# Patient Record
Sex: Female | Born: 1975 | Race: White | Hispanic: No | Marital: Married | Smoking: Former smoker
Health system: Southern US, Community
[De-identification: ages and names within clinical notes are randomized; demographics above are authoritative.]

## PROBLEM LIST (undated history)

## (undated) DIAGNOSIS — O24419 Gestational diabetes mellitus in pregnancy, unspecified control: Secondary | ICD-10-CM

## (undated) DIAGNOSIS — M5030 Other cervical disc degeneration, unspecified cervical region: Secondary | ICD-10-CM

## (undated) HISTORY — DX: Gestational diabetes mellitus in pregnancy, unspecified control: O24.419

## (undated) HISTORY — PX: OTHER SURGICAL HISTORY: SHX169

## (undated) HISTORY — DX: Other cervical disc degeneration, unspecified cervical region: M50.30

---

## 1997-11-11 ENCOUNTER — Inpatient Hospital Stay (HOSPITAL_COMMUNITY): Admission: AD | Admit: 1997-11-11 | Discharge: 1997-11-17 | Payer: Self-pay | Admitting: Obstetrics & Gynecology

## 1997-11-29 ENCOUNTER — Inpatient Hospital Stay (HOSPITAL_COMMUNITY): Admission: AD | Admit: 1997-11-29 | Discharge: 1997-11-29 | Payer: Self-pay | Admitting: *Deleted

## 1997-12-01 ENCOUNTER — Encounter: Admission: RE | Admit: 1997-12-01 | Discharge: 1998-03-01 | Payer: Self-pay | Admitting: Obstetrics

## 1997-12-08 ENCOUNTER — Inpatient Hospital Stay (HOSPITAL_COMMUNITY): Admission: AD | Admit: 1997-12-08 | Discharge: 1997-12-11 | Payer: Self-pay | Admitting: Obstetrics & Gynecology

## 1997-12-26 ENCOUNTER — Inpatient Hospital Stay (HOSPITAL_COMMUNITY): Admission: AD | Admit: 1997-12-26 | Discharge: 1997-12-26 | Payer: Self-pay | Admitting: Obstetrics & Gynecology

## 1998-01-03 ENCOUNTER — Inpatient Hospital Stay (HOSPITAL_COMMUNITY): Admission: AD | Admit: 1998-01-03 | Discharge: 1998-01-03 | Payer: Self-pay | Admitting: Obstetrics

## 1998-01-12 ENCOUNTER — Inpatient Hospital Stay (HOSPITAL_COMMUNITY): Admission: AD | Admit: 1998-01-12 | Discharge: 1998-01-15 | Payer: Self-pay | Admitting: Obstetrics & Gynecology

## 2005-10-07 ENCOUNTER — Encounter: Admission: RE | Admit: 2005-10-07 | Discharge: 2005-10-07 | Payer: Self-pay | Admitting: Gynecology

## 2005-11-06 ENCOUNTER — Ambulatory Visit (HOSPITAL_COMMUNITY): Admission: RE | Admit: 2005-11-06 | Discharge: 2005-11-06 | Payer: Self-pay | Admitting: Obstetrics and Gynecology

## 2005-11-28 ENCOUNTER — Inpatient Hospital Stay (HOSPITAL_COMMUNITY): Admission: AD | Admit: 2005-11-28 | Discharge: 2005-11-30 | Payer: Self-pay | Admitting: Obstetrics and Gynecology

## 2014-08-12 DIAGNOSIS — M5030 Other cervical disc degeneration, unspecified cervical region: Secondary | ICD-10-CM

## 2014-08-12 HISTORY — DX: Other cervical disc degeneration, unspecified cervical region: M50.30

## 2017-07-02 ENCOUNTER — Encounter: Payer: Self-pay | Admitting: Family Medicine

## 2017-07-02 ENCOUNTER — Ambulatory Visit (INDEPENDENT_AMBULATORY_CARE_PROVIDER_SITE_OTHER): Payer: Self-pay | Admitting: Family Medicine

## 2017-07-02 DIAGNOSIS — M545 Low back pain, unspecified: Secondary | ICD-10-CM | POA: Insufficient documentation

## 2017-07-02 DIAGNOSIS — G8929 Other chronic pain: Secondary | ICD-10-CM

## 2017-07-02 MED ORDER — NORTRIPTYLINE HCL 25 MG PO CAPS: ORAL_CAPSULE | ORAL | 0 refills | Status: DC

## 2017-07-02 NOTE — Patient Instructions (Addendum)
  Thank you for coming in today. Start PT.  Try [facility].   Use Nortriptyline for pain at night.   Recheck after MRI.   Use TENS unit.   TENS UNIT: This is helpful for muscle pain and spasm.   Search and Purchase a TENS 7000 2nd edition at  www.tenspros.com or www.Milam.com It should be less than $30.     TENS unit instructions: Do not shower or bathe with the unit on Turn the unit off before removing electrodes or batteries If the electrodes lose stickiness add a drop of water to the electrodes after they are disconnected from the unit and place on plastic sheet. If you continued to have difficulty, call the TENS unit company to purchase more electrodes. Do not apply lotion on the skin area prior to use. Make sure the skin is clean and dry as this will help prolong the life of the electrodes. After use, always check skin for unusual red areas, rash or other skin difficulties. If there are any skin problems, does not apply electrodes to the same area. Never remove the electrodes from the unit by pulling the wires. Do not use the TENS unit or electrodes other than as directed. Do not change electrode placement without consultating your therapist or physician. Keep 2 fingers with between each electrode. Wear time ratio is 2:1, on to off times.    For example on for 30 minutes off for 15 minutes and then on for 30 minutes off for 15 minutes      [facility] Now Open! [facility], their Department of Physical Therapy, and [facility] believe that as a responsible member of the community, contributing to the health and wellness of its citizens is of the greatest importance.    [facility] Hours: M,W,F 8-5 Tu 7-4 Th 9-6  To Schedule an Appointment call:  [phone]  [place], [place] [address]  (Next to [employer] by [employer])        PROVIDING PHYSICAL THERAPY CARE TO THOSE:  WHO DO NOT HAVE  INSURANCE WHOSE INSURANCE COVERAGE HAS LAPSED WHOSE INSURANCE HAS RUN OUT [place] Statement

## 2017-07-02 NOTE — Progress Notes (Signed)
Nancy Cochran is a 41 y.o. female who presents to [place] today for chronic back pain.  [name] is a long history of chronic low back pain.  She has had periodic evaluations in the past including a lumbar x-ray several months ago at an urgent care that was reportedly normal.  She notes pain in her low back associated with stiffness worse when rising from a seated position and worse with activity.  She is tried multiple over-the-counter medications which have not been very effective.  She has had muscle relaxers in the past which did not help.  She is very frustrated with the degree of back pain that she has.  She notes it is quite severe at times and sometimes debilitating.  She is delayed care because she does not have health insurance and is worried about health costs.   Past Medical History:  Diagnosis Date  . DDD (degenerative disc disease), cervical 2016   Dr. Karie Schwalbe   History reviewed. No pertinent surgical history. Social History   Tobacco Use  . Smoking status: Never Smoker  . Smokeless tobacco: Never Used  Substance Use Topics  . Alcohol use: No    Frequency: Never   Family history is unknown by patient.  ROS:  No headache, visual changes, nausea, vomiting, diarrhea, constipation, dizziness, abdominal pain, skin rash, fevers, chills, night sweats, weight loss, swollen lymph nodes, body aches, joint swelling, muscle aches, chest pain, shortness of breath, mood changes, visual or auditory hallucinations.    Medications: Current Outpatient Medications  Medication Sig Dispense Refill  . cyclobenzaprine (FLEXERIL) 5 MG tablet Take 5 mg by mouth 3 (three) times daily as needed for muscle spasms.    . nortriptyline (PAMELOR) 25 MG capsule One capsule by mouth each bedtime for a week then 2 capsules at bedtime. 60 capsule 0   No current facility-administered medications for this visit.    No Known Allergies   Exam:  BP  113/75   Pulse 100   Temp 98.2 F (36.8 C) (Oral)   Resp 16   Wt 132 lb 1.9 oz (59.9 kg)   SpO2 100%  General: Well Developed, well nourished, and in no acute distress.  Neuro/Psych: Alert and oriented x3, extra-ocular muscles intact, able to move all 4 extremities, sensation grossly intact. Skin: Warm and dry, no rashes noted.  Respiratory: Not using accessory muscles, speaking in full sentences, trachea midline.  Cardiovascular: Pulses palpable, no extremity edema. Abdomen: Does not appear distended. MSK: \ L-spine: Nontender to spinal midline.  Tender to palpation bilateral lumbar paraspinal muscles. Significantly decreased lumbar motion especially with flexion and extension.  Limited rotation as well. Patient has poor postural control and motion. Lower extremely strength is equal normal throughout.  Reflexes are intact bilateral knees and ankles and sensation is intact throughout.  Mild antalgic gait present    No results found for this or any previous visit (from the past 48 hour(s)). No results found.    Assessment and Plan: 41 y.o. female with  Chronic low back pain.  X-ray reportedly normal.  Discussed options.  Plan to proceed with MRI of the lumbar spine to evaluate for other serious issues including spondylolisthesis as this would impact treatment options.  We had a lengthy discussion about the poor prognosis of chronic low back pain.  Ultimately I think the best treatment option is probably physical therapy.  Unfortunately her lack of health insurance does impact her ability to receive physical therapy  services.  We will try to seek out pro bono physical therapy at [facility]  Additionally we will try to treat pain with nortriptyline  Recheck following MRI.    Orders Placed This Encounter  Procedures  . MR Lumbar Spine Wo Contrast    Standing Status:   Future    Standing Expiration Date:   09/01/2018    Order Specific Question:   What is the  patient's sedation requirement?    Answer:   No Sedation    Order Specific Question:   Does the patient have a pacemaker or implanted devices?    Answer:   No    Order Specific Question:   Preferred imaging location?    Answer:   Product/process development scientist (table limit-350lbs)    Order Specific Question:   Radiology Contrast Protocol - do NOT remove file path    Answer:   [email or web address]  . Ambulatory referral to Physical Therapy    Referral Priority:   Routine    Referral Type:   Physical Medicine    Referral Reason:   Specialty Services Required    Requested Specialty:   Physical Therapy    Number of Visits Requested:   1   Meds ordered this encounter  Medications  . nortriptyline (PAMELOR) 25 MG capsule    Sig: One capsule by mouth each bedtime for a week then 2 capsules at bedtime.    Dispense:  60 capsule    Refill:  0    Discussed warning signs or symptoms. Please see discharge instructions. Patient expresses understanding.

## 2017-07-08 ENCOUNTER — Telehealth: Payer: Self-pay

## 2017-07-08 NOTE — Telephone Encounter (Signed)
Which medicine are we talking about?

## 2017-07-08 NOTE — Telephone Encounter (Addendum)
Nancy Cochran called and doesn't want to go up to 2 capsules. She is doing better with just taking the 1 capsule. She has reduced the ibuprofen to 2  Tablets daily. Is it necessary to go up to 2? Please advise.

## 2017-07-09 NOTE — Telephone Encounter (Signed)
One of nortriptyline should be fine.

## 2017-07-09 NOTE — Telephone Encounter (Signed)
Sorry, nortriptyline

## 2017-07-10 NOTE — Telephone Encounter (Signed)
Left message advising ok to take just one tablet daily.

## 2017-07-14 ENCOUNTER — Ambulatory Visit (INDEPENDENT_AMBULATORY_CARE_PROVIDER_SITE_OTHER): Payer: Self-pay

## 2017-07-14 DIAGNOSIS — M4317 Spondylolisthesis, lumbosacral region: Secondary | ICD-10-CM

## 2017-07-14 DIAGNOSIS — M545 Low back pain: Principal | ICD-10-CM

## 2017-07-14 DIAGNOSIS — G8929 Other chronic pain: Secondary | ICD-10-CM

## 2017-07-17 ENCOUNTER — Encounter: Payer: Self-pay | Admitting: Family Medicine

## 2017-07-17 ENCOUNTER — Ambulatory Visit (INDEPENDENT_AMBULATORY_CARE_PROVIDER_SITE_OTHER): Payer: Self-pay | Admitting: Family Medicine

## 2017-07-17 VITALS — BP 123/82 | HR 109 | Ht 63.0 in | Wt 131.0 lb

## 2017-07-17 DIAGNOSIS — M545 Low back pain: Secondary | ICD-10-CM

## 2017-07-17 DIAGNOSIS — R202 Paresthesia of skin: Secondary | ICD-10-CM

## 2017-07-17 DIAGNOSIS — M4300 Spondylolysis, site unspecified: Secondary | ICD-10-CM

## 2017-07-17 DIAGNOSIS — G8929 Other chronic pain: Secondary | ICD-10-CM

## 2017-07-17 HISTORY — DX: Spondylolysis, site unspecified: M43.00

## 2017-07-17 MED ORDER — GABAPENTIN 300 MG PO CAPS: 300.0000 mg | ORAL_CAPSULE | Freq: Every day | ORAL | 3 refills | Status: DC

## 2017-07-17 NOTE — Progress Notes (Signed)
Nancy Cochran is a 41 y.o. female who presents to [place] today for chronic back pain.  Nancy Cochran was seen on 07/02/17 for chronic back pain. MRI done in the interim showed some arthritic changes in her lumbar spine bulging disc possibly impinging the left S1 nerve root and most importantly L5-S1 pars defects with anterolisthesis.  On the 21st I ordered a physical therapy referral and she is currently scheduled for [facility] PT at [facility] in January.  She notes continued back pain and that she was unable to tolerate nortriptyline.  Nancy Cochran denies any radiating pain down her left leg in the S1 nerve distribution.  She does note some tingling and itching sensation across the dorsal aspect of her forefoot at toes 2 3 and 4 that interfere with her ability to go to sleep.  This is been ongoing for over a year.  She denies any pain or numbness.   Past Medical History:  Diagnosis Date  . DDD (degenerative disc disease), cervical 2016   Dr. Karie Schwalbe   No past surgical history on file. Social History   Tobacco Use  . Smoking status: Never Smoker  . Smokeless tobacco: Never Used  Substance Use Topics  . Alcohol use: No    Frequency: Never     ROS:  As above   Medications: Current Outpatient Medications  Medication Sig Dispense Refill  . cyclobenzaprine (FLEXERIL) 5 MG tablet Take 5 mg by mouth 3 (three) times daily as needed for muscle spasms.    Marland Kitchen gabapentin (NEURONTIN) 300 MG capsule Take 1 capsule (300 mg total) by mouth at bedtime. 30 capsule 3   No current facility-administered medications for this visit.    No Known Allergies   Exam:  BP 123/82   Pulse (!) 109   Ht 5' 3"  (1.6 m)   Wt 131 lb (59.4 kg)   BMI 23.21 kg/m  General: Well Developed, well nourished, and in no acute distress.  Neuro/Psych: Alert and oriented x3, extra-ocular muscles intact, able to move all 4 extremities, sensation grossly  intact. Skin: Warm and dry, no rashes noted.  Respiratory: Not using accessory muscles, speaking in full sentences, trachea midline.  Cardiovascular: Pulses palpable, no extremity edema. Abdomen: Does not appear distended. MSK:  L-spine: Nontender decreased motion. Left foot normal-appearing nontender normal sensation    No results found for this or any previous visit (from the past 48 hour(s)). Mr Lumbar Spine Wo Contrast  Result Date: 07/14/2017 CLINICAL DATA:  Chronic low back pain.  Gluteal pain. EXAM: MRI LUMBAR SPINE WITHOUT CONTRAST TECHNIQUE: Multiplanar, multisequence MR imaging of the lumbar spine was performed. No intravenous contrast was administered. COMPARISON:  None. FINDINGS: Segmentation:  Standard. Alignment:  Slight grade 1 anterolisthesis at L5-S1. Vertebrae:  No fracture, evidence of discitis, or bone lesion. Conus medullaris and cauda equina: Conus extends to the T12 level. Conus and cauda equina appear normal. Paraspinal and other soft tissues: Negative. Disc levels: T12- L1: Unremarkable. L1-L2: Unremarkable. L2-L3: Unremarkable. L3-L4: Unremarkable. L4-L5: Tiny central disc protrusion without neural contact. L5-S1:Grade 1 anterolisthesis with shallow left paracentral disc protrusion. The disc contacts the left descending S1 nerve root. IMPRESSION: 1. L5-S1 slight anterolisthesis with possible chronic L5 pars defects. 2. L5-S1 shallow left paracentral disc protrusion which contacts the descending S1 nerve root. 3. L4-5 tiny central disc protrusion without impingement. Electronically Signed   By: Monte Fantasia M.D.   On: 07/14/2017 09:00  Assessment and Plan: 41 y.o. female with  Back pain: Very likely due to myofascial disruption and dysfunction. Also Pars Defect is contributing.  Physical therapy will be very helpful.  Paresthesias: Possible related to bulging disc seen on MRI.  I think is more likely this is restless leg syndrome or some peripheral neuropathy.   Trial of gabapentin at nighttime.    No orders of the defined types were placed in this encounter.  Meds ordered this encounter  Medications  . gabapentin (NEURONTIN) 300 MG capsule    Sig: Take 1 capsule (300 mg total) by mouth at bedtime.    Dispense:  30 capsule    Refill:  3    Discussed warning signs or symptoms. Please see discharge instructions. Patient expresses understanding.

## 2017-07-17 NOTE — Patient Instructions (Signed)
Thank you for coming in today. Attend PT at [facility].  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  Work on core exercises.   For foot itching at night take gabapentin at bedtime.

## 2017-07-22 ENCOUNTER — Telehealth: Payer: Self-pay

## 2017-07-22 NOTE — Telephone Encounter (Signed)
Patient called stated that she would like to just take the Flexeril instead of the Gabapentin because it makes her sick to her stomach. Patient is requesting refills on the Flexeril which she got in April  from Urgent Care. Seynabou Fults,CMA

## 2017-07-23 ENCOUNTER — Other Ambulatory Visit: Payer: Self-pay

## 2017-07-23 MED ORDER — CYCLOBENZAPRINE HCL 5 MG PO TABS: 5.0000 mg | ORAL_TABLET | Freq: Three times a day (TID) | ORAL | 3 refills | Status: DC | PRN

## 2017-07-23 NOTE — Telephone Encounter (Signed)
Patient has been informed. Nicosha Struve,CMA  

## 2017-07-23 NOTE — Telephone Encounter (Signed)
Refil of flexeril sent to [facility]. Let me know if she wants it to go to a different pharmacy for cost.

## 2017-07-23 NOTE — Progress Notes (Signed)
Patient stated that gabepentin causes nausea. Rhonda Cunningham,CMA

## 2019-05-07 ENCOUNTER — Other Ambulatory Visit: Payer: Self-pay | Admitting: Family Medicine

## 2019-07-22 LAB — HM MAMMOGRAPHY

## 2019-07-30 LAB — HM PAP SMEAR: HM Pap smear: NEGATIVE

## 2019-08-27 ENCOUNTER — Ambulatory Visit: Payer: Self-pay | Attending: Internal Medicine

## 2019-08-27 ENCOUNTER — Other Ambulatory Visit: Payer: Self-pay

## 2019-08-27 DIAGNOSIS — Z20822 Contact with and (suspected) exposure to covid-19: Secondary | ICD-10-CM | POA: Insufficient documentation

## 2019-08-28 LAB — NOVEL CORONAVIRUS, NAA: SARS-CoV-2, NAA: NOT DETECTED

## 2019-11-19 ENCOUNTER — Encounter: Payer: Self-pay | Admitting: Sports Medicine

## 2019-11-19 ENCOUNTER — Ambulatory Visit (INDEPENDENT_AMBULATORY_CARE_PROVIDER_SITE_OTHER): Payer: Self-pay | Admitting: Sports Medicine

## 2019-11-19 ENCOUNTER — Other Ambulatory Visit: Payer: Self-pay

## 2019-11-19 DIAGNOSIS — M5030 Other cervical disc degeneration, unspecified cervical region: Secondary | ICD-10-CM

## 2019-11-19 DIAGNOSIS — F5101 Primary insomnia: Secondary | ICD-10-CM

## 2019-11-19 DIAGNOSIS — L301 Dyshidrosis [pompholyx]: Secondary | ICD-10-CM

## 2019-11-19 DIAGNOSIS — L309 Dermatitis, unspecified: Secondary | ICD-10-CM | POA: Insufficient documentation

## 2019-11-19 HISTORY — DX: Dermatitis, unspecified: L30.9

## 2019-11-19 HISTORY — DX: Primary insomnia: F51.01

## 2019-11-19 MED ORDER — MELOXICAM 15 MG PO TABS: ORAL_TABLET | ORAL | 3 refills | Status: DC

## 2019-11-19 MED ORDER — PREDNISONE 50 MG PO TABS
ORAL_TABLET | ORAL | 0 refills | Status: DC
Start: 2019-11-19 — End: 2019-12-31

## 2019-11-19 MED ORDER — ZOLPIDEM TARTRATE 10 MG PO TABS: 10.0000 mg | ORAL_TABLET | Freq: Every evening | ORAL | 3 refills | Status: DC | PRN

## 2019-11-19 MED ORDER — CYCLOBENZAPRINE HCL 10 MG PO TABS: ORAL_TABLET | ORAL | 0 refills | Status: DC

## 2019-11-19 MED ORDER — TRIAMCINOLONE ACETONIDE 0.5 % EX OINT: 1.0000 "application " | TOPICAL_OINTMENT | Freq: Two times a day (BID) | CUTANEOUS | 3 refills | Status: DC

## 2019-11-19 NOTE — Assessment & Plan Note (Addendum)
Fred also gets itchy lesions between her toes, they have little blisters and sounds like classic dyshidrotic eczema, adding topical triamcinolone ointment high-dose. She does report occasional color change in her toes, usually during the winter that sounds classic like Raynaud's phenomenon. I would like her to take a picture of this so we can assure her symptoms before starting a calcium channel blocker. I did again explain to her that I would not be continuing her primary care, only her orthopedic care and she needs to establish care with one of my medical partners.

## 2019-11-19 NOTE — Assessment & Plan Note (Signed)
Malai also has cervical DDD, significant pain in the neck, she has seen a chiropractor and had x-rays already that showed DDD. We are going to continue conservative care, home rehab exercises, 5 days of prednisone, Flexeril, meloxicam, return to see me in 4 to 6 weeks, MRI for interventional planning if no better.

## 2019-11-19 NOTE — Progress Notes (Signed)
    Procedures performed today:    None.  Independent interpretation of notes and tests performed by another provider:   None.  Brief History, Exam, Impression, and Recommendations:    Primary insomnia Patient is going to establish care with Dr. Luetta Nutting. In the meantime I would like to help her sleep, she tried Ambien 5 without any success, increasing Ambien 10.  Dyshidrotic eczema Terecia also gets itchy lesions between her toes, they have little blisters and sounds like classic dyshidrotic eczema, adding topical triamcinolone ointment high-dose. She does report occasional color change in her toes, usually during the winter that sounds classic like Raynaud's phenomenon. I would like her to take a picture of this so we can assure her symptoms before starting a calcium channel blocker. I did again explain to her that I would not be continuing her primary care, only her orthopedic care and she needs to establish care with one of my medical partners.  DDD (degenerative disc disease), cervical Almee also has cervical DDD, significant pain in the neck, she has seen a chiropractor and had x-rays already that showed DDD. We are going to continue conservative care, home rehab exercises, 5 days of prednisone, Flexeril, meloxicam, return to see me in 4 to 6 weeks, MRI for interventional planning if no better.    ___________________________________________ Gwen Her. Dianah Field, M.D., ABFM., CAQSM. Primary Care and [place] Instructor of [place] of [facility] of Medicine

## 2019-11-19 NOTE — Assessment & Plan Note (Signed)
Patient is going to establish care with Dr. Luetta Nutting. In the meantime I would like to help her sleep, she tried Ambien 5 without any success, increasing Ambien 10.

## 2019-11-30 ENCOUNTER — Telehealth: Payer: Self-pay

## 2019-11-30 NOTE — Telephone Encounter (Signed)
Does not sound like meloxicam, prednisone can do this for the first several days, if it is not gone today or tomorrow she should really see Dr. Zigmund Daniel in the office THIS WEEK.

## 2019-11-30 NOTE — Telephone Encounter (Signed)
Pt states she has been experiencing racing heart rate, lower back spasms, and a mild headache. She thought it might be attributed to prednisone. Prednisone treatment has been completed. Pt wonders if meloxicam could be causing the symptoms.

## 2019-12-03 NOTE — Telephone Encounter (Signed)
Spoke to Nancy Cochran this morning. Due to not having insurance she would like to avoid another doctors appointment. She states the racing heart rate is an issue she's had since age 44, intermittently. All symptoms have subsided as of this morning. She stopped taking Meloxicam 1 day prior. Advised patient she did not have to take the Meloxicam if she felt it was a contributing factor. Also advised patient if palpitations returned to visit the Urgent Care or ER for treatment.

## 2019-12-31 ENCOUNTER — Ambulatory Visit (INDEPENDENT_AMBULATORY_CARE_PROVIDER_SITE_OTHER): Payer: Self-pay | Admitting: Family Medicine

## 2019-12-31 ENCOUNTER — Other Ambulatory Visit: Payer: Self-pay

## 2019-12-31 ENCOUNTER — Ambulatory Visit (INDEPENDENT_AMBULATORY_CARE_PROVIDER_SITE_OTHER): Payer: Self-pay | Admitting: Sports Medicine

## 2019-12-31 ENCOUNTER — Encounter: Payer: Self-pay | Admitting: Family Medicine

## 2019-12-31 VITALS — BP 120/67 | HR 106 | Temp 98.0°F | Ht 62.99 in | Wt 127.8 lb

## 2019-12-31 DIAGNOSIS — F411 Generalized anxiety disorder: Secondary | ICD-10-CM

## 2019-12-31 DIAGNOSIS — F5101 Primary insomnia: Secondary | ICD-10-CM

## 2019-12-31 DIAGNOSIS — M5030 Other cervical disc degeneration, unspecified cervical region: Secondary | ICD-10-CM

## 2019-12-31 DIAGNOSIS — G43819 Other migraine, intractable, without status migrainosus: Secondary | ICD-10-CM

## 2019-12-31 MED ORDER — AMITRIPTYLINE HCL 25 MG PO TABS: 25.0000 mg | ORAL_TABLET | Freq: Every day | ORAL | 3 refills | Status: DC

## 2019-12-31 MED ORDER — ONDANSETRON 4 MG PO TBDP
4.0000 mg | ORAL_TABLET | Freq: Three times a day (TID) | ORAL | 0 refills | Status: DC | PRN
Start: 2019-12-31 — End: 2021-04-13

## 2019-12-31 NOTE — Patient Instructions (Signed)
Great to meet you today! Let's have you stop flexeril.  Start amitriptyline at bedtime.  I think this will help with sleep, migraines, anxiety and may help with your IBS symptoms as well.   Try to work on increasing fiber intake for hemorrhoids.  IB Donald Prose may help with GI symptoms related to IBS as well.  I will plan to see you back in about 8 weeks.

## 2019-12-31 NOTE — Progress Notes (Signed)
    Procedures performed today:    None.  Independent interpretation of notes and tests performed by another provider:   None.  Brief History, Exam, Impression, and Recommendations:    DDD (degenerative disc disease), cervical Nancy Cochran returns, she has cervical DDD, pain in the neck, left-sided lesion into the trapezius, but not past the shoulder. At this point she has failed greater than 6 weeks of conservative measures including prednisone, meloxicam. Her PCP is switching her from Flexeril to amitriptyline which is appropriate. I am going to proceed with MRI for interventional planning, her pain does seem facetogenic. We can do a Doximity virtual visit to go over MRI results.    ___________________________________________ Gwen Her. Dianah Field, M.D., ABFM., CAQSM. Primary Care and [place] Instructor of [place] of [facility] of Medicine

## 2019-12-31 NOTE — Assessment & Plan Note (Signed)
Nancy Cochran returns, she has cervical DDD, pain in the neck, left-sided lesion into the trapezius, but not past the shoulder. At this point she has failed greater than 6 weeks of conservative measures including prednisone, meloxicam. Her PCP is switching her from Flexeril to amitriptyline which is appropriate. I am going to proceed with MRI for interventional planning, her pain does seem facetogenic. We can do a Doximity virtual visit to go over MRI results.

## 2020-01-02 NOTE — Progress Notes (Signed)
Nancy Cochran - 44 y.o. female MRN [id number]  Date of birth: [birth year]  Subjective Chief Complaint  Patient presents with  . Establish Care    HPI Nancy Cochran is a 44 y.o. with history of neck pain here today for initial visit with me.  She reports that she is having increased headaches that are migrainous in nature.  She has had these for several years.  She has been seeing [facility] for management of neck pain.    She has also had increased anxiety with poor sleep.  She is currently taking trazodone and ambien for this.  She doesn't feel that trazodone has really been helpful.    She reports increased ibs symptoms.  These symptoms alternate between constipation and diarrhea.  She has had hemorrhoid flares due to this.    ROS:  A comprehensive ROS was completed and negative except as noted per HPI  No Known Allergies  Past Medical History:  Diagnosis Date  . DDD (degenerative disc disease), cervical 2016   Dr. Karie Schwalbe  . Gestational diabetes     History reviewed. No pertinent surgical history.  Social History   Socioeconomic History  . Marital status: Single    Spouse name: Not on file  . Number of children: 2  . Years of education: Not on file  . Highest education level: Not on file  Occupational History  . Occupation: Oceanographer  Tobacco Use  . Smoking status: Current Every Day Smoker    Packs/day: 1.00    Years: 28.00    Pack years: 28.00    Types: Cigarettes  . Smokeless tobacco: Never Used  Substance and Sexual Activity  . Alcohol use: No  . Drug use: No  . Sexual activity: Yes    Partners: Male    Birth control/protection: Injection  Other Topics Concern  . Not on file  Social History Narrative  . Not on file   Social Determinants of Health   Financial Resource Strain:   . Difficulty of Paying Living Expenses:   Food Insecurity:   . Worried About Charity fundraiser in the Last Year:   . Arboriculturist in the Last Year:    Transportation Needs:   . Film/video editor (Medical):   Marland Kitchen Lack of Transportation (Non-Medical):   Physical Activity:   . Days of Exercise per Week:   . Minutes of Exercise per Session:   Stress:   . Feeling of Stress :   Social Connections:   . Frequency of Communication with Friends and Family:   . Frequency of Social Gatherings with Friends and Family:   . Attends Religious Services:   . Active Member of Clubs or Organizations:   . Attends Archivist Meetings:   Marland Kitchen Marital Status:     Family History  Problem Relation Age of Onset  . Hypertension Maternal Grandmother   . Heart attack Maternal Grandmother   . Hypertension Maternal Grandfather   . Heart attack Maternal Grandfather   . Hypertension Paternal Grandmother   . Heart attack Paternal Grandmother   . Stroke Paternal Grandmother   . Hypertension Paternal Grandfather   . Heart attack Paternal Grandfather   . Diabetes Maternal Uncle   . Stroke Maternal Aunt   . Breast cancer Neg Hx        Mothers family  . Cancer Neg Hx        Fathers family    Health Maintenance  Topic Date Due  .  HIV Screening  Never done  . TETANUS/TDAP  Never done  . PAP SMEAR-Modifier  Never done  . COVID-19 Vaccine (1) 01/16/2020 (Originally 06[birth year]26/1990)  . INFLUENZA VACCINE  03/12/2020     ----------------------------------------------------------------------------------------------------------------------------------------------------------------------------------------------------------------- Physical Exam BP 120/67 (BP Location: Left Arm, Patient Position: Sitting, Cuff Size: Normal)   Pulse (!) 106   Temp 98 F (36.7 C) (Temporal)   Ht 5' 2.99" (1.6 m)   Wt 127 lb 12.8 oz (58 kg)   SpO2 98%   BMI 22.64 kg/m   Physical Exam Constitutional:      Appearance: Normal appearance.  Eyes:     General: No scleral icterus. Cardiovascular:     Rate and Rhythm: Normal rate and regular rhythm.  Pulmonary:      Effort: Pulmonary effort is normal.     Breath sounds: Normal breath sounds.  Musculoskeletal:     Cervical back: Neck supple.  Skin:    General: Skin is warm and dry.  Neurological:     General: No focal deficit present.     Mental Status: She is alert.  Psychiatric:        Mood and Affect: Mood normal.        Behavior: Behavior normal.     ------------------------------------------------------------------------------------------------------------------------------------------------------------------------------------------------------------------- Assessment and Plan  GAD (generalized anxiety disorder) We discussed options for management of her anxiety.  She also has associated migraine, insomnia and chronic neck pain.   I think amitriptyline would be helpful for several of these conditions.  Discussed cymbalta as an option too but I think the elavil would be more beneficial the insomnia.    Migraines Starting elavil as well as zofran for associated nausea.    Meds ordered this encounter  Medications  . amitriptyline (ELAVIL) 25 MG tablet    Sig: Take 1 tablet (25 mg total) by mouth at bedtime.    Dispense:  30 tablet    Refill:  3  . ondansetron (ZOFRAN ODT) 4 MG disintegrating tablet    Sig: Take 1 tablet (4 mg total) by mouth every 8 (eight) hours as needed for nausea or vomiting.    Dispense:  20 tablet    Refill:  0    Return in about 8 weeks (around 02/25/2020) for Migraines/anxiety.    This visit occurred during the SARS-CoV-2 public health emergency.  Safety protocols were in place, including screening questions prior to the visit, additional usage of staff PPE, and extensive cleaning of exam room while observing appropriate contact time as indicated for disinfecting solutions.

## 2020-01-03 ENCOUNTER — Encounter: Payer: Self-pay | Admitting: Family Medicine

## 2020-01-03 DIAGNOSIS — G43909 Migraine, unspecified, not intractable, without status migrainosus: Secondary | ICD-10-CM | POA: Insufficient documentation

## 2020-01-03 DIAGNOSIS — F411 Generalized anxiety disorder: Secondary | ICD-10-CM | POA: Insufficient documentation

## 2020-01-03 HISTORY — DX: Migraine, unspecified, not intractable, without status migrainosus: G43.909

## 2020-01-03 HISTORY — DX: Generalized anxiety disorder: F41.1

## 2020-01-03 NOTE — Assessment & Plan Note (Signed)
We discussed options for management of her anxiety.  She also has associated migraine, insomnia and chronic neck pain.   I think amitriptyline would be helpful for several of these conditions.  Discussed cymbalta as an option too but I think the elavil would be more beneficial the insomnia.

## 2020-01-03 NOTE — Assessment & Plan Note (Signed)
Starting elavil as well as zofran for associated nausea.

## 2020-01-04 ENCOUNTER — Encounter: Payer: Self-pay | Admitting: Family Medicine

## 2020-01-04 NOTE — Progress Notes (Unsigned)
Negative for intraepithelial lesion or malignancy.  

## 2020-01-06 ENCOUNTER — Encounter: Payer: Self-pay | Admitting: Family Medicine

## 2020-01-07 ENCOUNTER — Other Ambulatory Visit: Payer: Self-pay | Admitting: Family Medicine

## 2020-01-07 MED ORDER — ALBUTEROL SULFATE HFA 108 (90 BASE) MCG/ACT IN AERS: 2.0000 | INHALATION_SPRAY | Freq: Four times a day (QID) | RESPIRATORY_TRACT | 3 refills | Status: DC | PRN

## 2020-01-07 NOTE — Telephone Encounter (Signed)
Do not see inhaler on medication list.

## 2020-01-08 ENCOUNTER — Ambulatory Visit (INDEPENDENT_AMBULATORY_CARE_PROVIDER_SITE_OTHER): Payer: Self-pay

## 2020-01-08 ENCOUNTER — Other Ambulatory Visit: Payer: Self-pay

## 2020-01-08 DIAGNOSIS — M5030 Other cervical disc degeneration, unspecified cervical region: Secondary | ICD-10-CM

## 2020-01-10 ENCOUNTER — Other Ambulatory Visit: Payer: Self-pay

## 2020-01-11 ENCOUNTER — Other Ambulatory Visit: Payer: Self-pay

## 2020-02-23 ENCOUNTER — Encounter: Payer: Self-pay | Admitting: Family Medicine

## 2020-02-23 ENCOUNTER — Telehealth (INDEPENDENT_AMBULATORY_CARE_PROVIDER_SITE_OTHER): Payer: Self-pay | Admitting: Family Medicine

## 2020-02-23 DIAGNOSIS — G43819 Other migraine, intractable, without status migrainosus: Secondary | ICD-10-CM

## 2020-02-23 DIAGNOSIS — F411 Generalized anxiety disorder: Secondary | ICD-10-CM

## 2020-02-23 MED ORDER — HYDROXYZINE HCL 25 MG PO TABS: 25.0000 mg | ORAL_TABLET | Freq: Three times a day (TID) | ORAL | 2 refills | Status: DC | PRN

## 2020-02-23 NOTE — Assessment & Plan Note (Signed)
Continue elavil at current dose Adding hydroxyzine as needed for anxiety.  Return in about 4 weeks (around 03/22/2020) for Anxiety.

## 2020-02-23 NOTE — Assessment & Plan Note (Addendum)
This has improved since starting elavil.  Will continue at current dose as she was unable to tolerate dose increase.   Continues to have back and neck pain.  Discussed potentially changing to cymbalta at next visit if still not adequately controlled but I did let her know this may not be as effective at controlling her migraines.

## 2020-02-23 NOTE — Progress Notes (Signed)
VIRGIL SLINGER - 44 y.o. female MRN [id number]  Date of birth: 1976/01/22   This visit type was conducted due to national recommendations for restrictions regarding the COVID-19 Pandemic (e.g. social distancing).  This format is felt to be most appropriate for this patient at this time.  All issues noted in this document were discussed and addressed.  No physical exam was performed (except for noted visual exam findings with Video Visits).  I discussed the limitations of evaluation and management by telemedicine and the availability of in person appointments. The patient expressed understanding and agreed to proceed.  I connected with@ on 02/23/20 at  8:50 AM EDT by a video enabled telemedicine application and verified that I am speaking with the correct person using two identifiers.  Present at visit: Luetta Nutting, DO Leda Min   Patient Location: [place] [address]   Provider location:   [place]  Patient presents with  . Anxiety    HPI  MEI SUITS is a 44 y.o. female who presents via audio/video conferencing for a telehealth visit today.  She is following up today for anxiety, migraines and back and neck pain.  She was started on amitriptyline at last visit.  Reports significant improvement in migraines since starting this.  Hasn't had a whole lot of improvement in neck and back pain.  Anxiety is about the same.  She did try increasing amitriptyline to to two tablets but reports that she had increased headaches after doing this.     ROS:  A comprehensive ROS was completed and negative except as noted per HPI  Past Medical History:  Diagnosis Date  . DDD (degenerative disc disease), cervical 2016   Dr. Karie Schwalbe  . Gestational diabetes     No past surgical history on file.  Family History  Problem Relation Age of Onset  . Hypertension Maternal Grandmother   . Heart attack Maternal Grandmother   . Hypertension Maternal  Grandfather   . Heart attack Maternal Grandfather   . Hypertension Paternal Grandmother   . Heart attack Paternal Grandmother   . Stroke Paternal Grandmother   . Hypertension Paternal Grandfather   . Heart attack Paternal Grandfather   . Diabetes Maternal Uncle   . Stroke Maternal Aunt   . Breast cancer Neg Hx        Mothers family  . Cancer Neg Hx        Fathers family    Social History   Socioeconomic History  . Marital status: Single    Spouse name: Not on file  . Number of children: 2  . Years of education: Not on file  . Highest education level: Not on file  Occupational History  . Occupation: Oceanographer  Tobacco Use  . Smoking status: Current Every Day Smoker    Packs/day: 1.00    Years: 28.00    Pack years: 28.00    Types: Cigarettes  . Smokeless tobacco: Never Used  Vaping Use  . Vaping Use: Some days  . Substances: Nicotine  Substance and Sexual Activity  . Alcohol use: No  . Drug use: No  . Sexual activity: Yes    Partners: Male    Birth control/protection: Injection  Other Topics Concern  . Not on file  Social History Narrative  . Not on file   Social Determinants of Health   Financial Resource Strain:   . Difficulty of Paying Living Expenses:   Food Insecurity:   .  Worried About Charity fundraiser in the Last Year:   . Arboriculturist in the Last Year:   Transportation Needs:   . Film/video editor (Medical):   Marland Kitchen Lack of Transportation (Non-Medical):   Physical Activity:   . Days of Exercise per Week:   . Minutes of Exercise per Session:   Stress:   . Feeling of Stress :   Social Connections:   . Frequency of Communication with Friends and Family:   . Frequency of Social Gatherings with Friends and Family:   . Attends Religious Services:   . Active Member of Clubs or Organizations:   . Attends Archivist Meetings:   Marland Kitchen Marital Status:   Intimate Partner Violence:   . Fear of Current or Ex-Partner:   . Emotionally  Abused:   Marland Kitchen Physically Abused:   . Sexually Abused:      Current Outpatient Medications:  .  albuterol (VENTOLIN HFA) 108 (90 Base) MCG/ACT inhaler, Inhale 2 puffs into the lungs every 6 (six) hours as needed for wheezing or shortness of breath., Disp: 8 g, Rfl: 3 .  amitriptyline (ELAVIL) 25 MG tablet, Take 1 tablet (25 mg total) by mouth at bedtime., Disp: 30 tablet, Rfl: 3 .  ibuprofen (ADVIL) 400 MG tablet, Take by mouth., Disp: , Rfl:  .  medroxyPROGESTERone (DEPO-PROVERA) 150 MG/ML injection, INJECT 1ML EVERY 11-12 WEEKS, Disp: , Rfl:  .  Melatonin 10 MG TABS, Take by mouth., Disp: , Rfl:  .  meloxicam (MOBIC) 15 MG tablet, One tab PO qAM with a meal for 2 weeks, then daily prn pain., Disp: 30 tablet, Rfl: 3 .  multivitamin-iron-minerals-folic acid (CENTRUM) chewable tablet, Chew 1 tablet by mouth daily., Disp: , Rfl:  .  ondansetron (ZOFRAN ODT) 4 MG disintegrating tablet, Take 1 tablet (4 mg total) by mouth every 8 (eight) hours as needed for nausea or vomiting., Disp: 20 tablet, Rfl: 0 .  zolpidem (AMBIEN) 10 MG tablet, Take 1 tablet (10 mg total) by mouth at bedtime as needed for sleep., Disp: 30 tablet, Rfl: 3  EXAM:  VITALS per patient if applicable: Wt 585 lb (27.7 kg)   BMI 22.68 kg/m   GENERAL: alert, oriented, appears well and in no acute distress  HEENT: atraumatic, conjunttiva clear, no obvious abnormalities on inspection of external nose and ears  NECK: normal movements of the head and neck  LUNGS: on inspection no signs of respiratory distress, breathing rate appears normal, no obvious gross SOB, gasping or wheezing  CV: no obvious cyanosis  MS: moves all visible extremities without noticeable abnormality  PSYCH/NEURO: pleasant and cooperative, no obvious depression or anxiety, speech and thought processing grossly intact  ASSESSMENT AND PLAN:  Discussed the following assessment and plan:  Migraines This has improved since starting elavil.  Will continue  at current dose as she was unable to tolerate dose increase.   Continues to have back and neck pain.  Discussed potentially changing to cymbalta at next visit if still not adequately controlled but I did let her know this may not be as effective at controlling her migraines.    GAD (generalized anxiety disorder) Continue elavil at current dose Adding hydroxyzine as needed for anxiety.  Return in about 4 weeks (around 03/22/2020) for Anxiety.      I discussed the assessment and treatment plan with the patient. The patient was provided an opportunity to ask questions and all were answered. The patient agreed with the plan and demonstrated  an understanding of the instructions.   The patient was advised to call back or seek an in-person evaluation if the symptoms worsen or if the condition fails to improve as anticipated.    Luetta Nutting, DO

## 2020-04-04 NOTE — Telephone Encounter (Signed)
Maybe ask her to call Dr. Isabelle Course office and see how much it would cost?  My suspicion is probably somewhere between $500 and $600.

## 2020-04-16 ENCOUNTER — Other Ambulatory Visit: Payer: Self-pay | Admitting: Family Medicine

## 2020-06-09 ENCOUNTER — Other Ambulatory Visit: Payer: Self-pay | Admitting: Sports Medicine

## 2020-06-09 DIAGNOSIS — F5101 Primary insomnia: Secondary | ICD-10-CM

## 2020-08-16 ENCOUNTER — Other Ambulatory Visit: Payer: Self-pay | Admitting: Family Medicine

## 2020-08-18 ENCOUNTER — Other Ambulatory Visit: Payer: Self-pay

## 2020-09-10 ENCOUNTER — Other Ambulatory Visit: Payer: Self-pay | Admitting: Family Medicine

## 2020-09-13 ENCOUNTER — Encounter: Payer: Self-pay | Admitting: Family Medicine

## 2020-10-12 ENCOUNTER — Other Ambulatory Visit: Payer: Self-pay

## 2020-10-12 DIAGNOSIS — G43819 Other migraine, intractable, without status migrainosus: Secondary | ICD-10-CM

## 2020-10-12 LAB — LIPID PANEL
Cholesterol: 233 mg/dL — ABNORMAL HIGH (ref <200)
HDL: 40 mg/dL — ABNORMAL LOW (ref >=50)
Non-HDL Cholesterol (Calc): 193 mg/dL (calc) — ABNORMAL HIGH (ref <130)

## 2020-10-12 LAB — COMPLETE METABOLIC PANEL WITH GFR
AG Ratio: 1.9 (calc) (ref 1.0–2.5)
ALT: 12 U/L (ref 6–29)
Chloride: 108 mmol/L (ref 98–110)

## 2020-10-12 LAB — CBC: RBC: 5.46 10*6/uL — ABNORMAL HIGH (ref 3.80–5.10)

## 2020-10-13 ENCOUNTER — Encounter: Payer: Self-pay | Admitting: Family Medicine

## 2020-10-13 LAB — CBC
HCT: 50.1 % — ABNORMAL HIGH (ref 35.0–45.0)
Hemoglobin: 16.8 g/dL — ABNORMAL HIGH (ref 11.7–15.5)
MCH: 30.8 pg (ref 27.0–33.0)
MCHC: 33.5 g/dL (ref 32.0–36.0)
MCV: 91.8 fL (ref 80.0–100.0)
MPV: 10.4 fL (ref 7.5–12.5)
Platelets: 355 10*3/uL (ref 140–400)
RDW: 12 % (ref 11.0–15.0)
WBC: 9.7 10*3/uL (ref 3.8–10.8)

## 2020-10-13 LAB — COMPLETE METABOLIC PANEL WITH GFR
AST: 13 U/L (ref 10–30)
Albumin: 4.8 g/dL (ref 3.6–5.1)
Alkaline phosphatase (APISO): 93 U/L (ref 31–125)
BUN: 8 mg/dL (ref 7–25)
CO2: 27 mmol/L (ref 20–32)
Calcium: 9.8 mg/dL (ref 8.6–10.2)
Creat: 0.72 mg/dL (ref 0.50–1.10)
GFR, Est African American: 120 mL/min/{1.73_m2} (ref >=60)
GFR, Est Non African American: 103 mL/min/{1.73_m2} (ref >=60)
Globulin: 2.5 g/dL (calc) (ref 1.9–3.7)
Glucose, Bld: 83 mg/dL (ref 65–139)
Potassium: 4.5 mmol/L (ref 3.5–5.3)
Sodium: 141 mmol/L (ref 135–146)
Total Bilirubin: 0.3 mg/dL (ref 0.2–1.2)
Total Protein: 7.3 g/dL (ref 6.1–8.1)

## 2020-10-13 LAB — LIPID PANEL
LDL Cholesterol (Calc): 159 mg/dL (calc) — ABNORMAL HIGH
Total CHOL/HDL Ratio: 5.8 (calc) — ABNORMAL HIGH (ref <5.0)
Triglycerides: 184 mg/dL — ABNORMAL HIGH (ref <150)

## 2020-10-13 LAB — TSH: TSH: 1.29 mIU/L

## 2020-10-16 ENCOUNTER — Other Ambulatory Visit: Payer: Self-pay | Admitting: Family Medicine

## 2020-10-17 ENCOUNTER — Other Ambulatory Visit: Payer: Self-pay | Admitting: Family Medicine

## 2020-10-17 MED ORDER — AMITRIPTYLINE HCL 25 MG PO TABS: ORAL_TABLET | ORAL | 1 refills | Status: DC

## 2020-12-11 ENCOUNTER — Other Ambulatory Visit: Payer: Self-pay | Admitting: Family Medicine

## 2021-02-07 ENCOUNTER — Other Ambulatory Visit: Payer: Self-pay | Admitting: Family Medicine

## 2021-02-20 ENCOUNTER — Other Ambulatory Visit: Payer: Self-pay | Admitting: Family Medicine

## 2021-02-20 DIAGNOSIS — F5101 Primary insomnia: Secondary | ICD-10-CM

## 2021-03-23 ENCOUNTER — Other Ambulatory Visit: Payer: Self-pay | Admitting: Family Medicine

## 2021-04-04 ENCOUNTER — Telehealth: Payer: Self-pay

## 2021-04-04 NOTE — Telephone Encounter (Signed)
Please call pt to schedule migraine appt. Has not been seen in a year. Unable to write additional medications without an OFFICE visit due to length between visits.   Per Dr. Zigmund Daniel, she can try to take Tylenol, Ibuprofen, increase fluids, rest, ice packs to help move current headache.

## 2021-04-05 NOTE — Telephone Encounter (Signed)
Patient has scheduled a virtual visit for Friday, April 13, 2021. AM

## 2021-04-13 ENCOUNTER — Telehealth (INDEPENDENT_AMBULATORY_CARE_PROVIDER_SITE_OTHER): Payer: Self-pay | Admitting: Family Medicine

## 2021-04-13 ENCOUNTER — Other Ambulatory Visit: Payer: Self-pay | Admitting: Family Medicine

## 2021-04-13 ENCOUNTER — Encounter: Payer: Self-pay | Admitting: Family Medicine

## 2021-04-13 VITALS — Ht 63.0 in | Wt 128.0 lb

## 2021-04-13 DIAGNOSIS — M5030 Other cervical disc degeneration, unspecified cervical region: Secondary | ICD-10-CM

## 2021-04-13 DIAGNOSIS — G43819 Other migraine, intractable, without status migrainosus: Secondary | ICD-10-CM

## 2021-04-13 DIAGNOSIS — F411 Generalized anxiety disorder: Secondary | ICD-10-CM

## 2021-04-13 DIAGNOSIS — F5101 Primary insomnia: Secondary | ICD-10-CM

## 2021-04-13 DIAGNOSIS — M4300 Spondylolysis, site unspecified: Secondary | ICD-10-CM

## 2021-04-13 MED ORDER — ZOLPIDEM TARTRATE 10 MG PO TABS: ORAL_TABLET | ORAL | 1 refills | Status: DC

## 2021-04-13 MED ORDER — ONDANSETRON 4 MG PO TBDP: 4.0000 mg | ORAL_TABLET | Freq: Three times a day (TID) | ORAL | 1 refills | Status: DC | PRN

## 2021-04-13 MED ORDER — AMITRIPTYLINE HCL 25 MG PO TABS: ORAL_TABLET | ORAL | 2 refills | Status: DC

## 2021-04-16 NOTE — Assessment & Plan Note (Signed)
Migraines are fairly well controlled with amitriptyline.  Continue at current strength.

## 2021-04-16 NOTE — Progress Notes (Signed)
Nancy Cochran - 45 y.o. female MRN [id number]  Date of birth: 1976-03-21   This visit type was conducted due to national recommendations for restrictions regarding the COVID-19 Pandemic (e.g. social distancing).  This format is felt to be most appropriate for this patient at this time.  All issues noted in this document were discussed and addressed.  No physical exam was performed (except for noted visual exam findings with Video Visits).  I discussed the limitations of evaluation and management by telemedicine and the availability of in person appointments. The patient expressed understanding and agreed to proceed.  I connected withNAME@ on 04/16/21 at 10:30 AM EDT by a video enabled telemedicine application and verified that I am speaking with the correct person using two identifiers.  Present at visit: Luetta Nutting, DO Leda Min   Patient Location: Home [place] [address]   Provider location:   [place]  No chief complaint on file.   HPI  Nancy Cochran is a 45 y.o. female who presents via audio/video conferencing for a telehealth visit today.  She is following up today for chronic headaches.  She reports that amitriptyline has been helpful for management of her chronic headaches.  She has not had any noticeable side effects related to this.  She does not feel like this has been helpful for her chronic pain related to her degenerative disc disease.  Ibuprofen has helped some however she does not want to take this regularly.  She would be interested in referral to pain management for further options to manage her pain.  Vistaril has been helpful for management of anxiety.  This has not been too sedating for her.  She does continue to use Ambien 1/2-1 tab nightly as needed.  One half tab typically works well for her.  ROS:  A comprehensive ROS was completed and negative except as noted per HPI    ROS:  A comprehensive ROS was completed and negative except as noted  per HPI  Past Medical History:  Diagnosis Date   DDD (degenerative disc disease), cervical 2016   Dr. Karie Schwalbe   Gestational diabetes     History reviewed. No pertinent surgical history.  Family History  Problem Relation Age of Onset   Hypertension Maternal Grandmother    Heart attack Maternal Grandmother    Hypertension Maternal Grandfather    Heart attack Maternal Grandfather    Hypertension Paternal Grandmother    Heart attack Paternal Grandmother    Stroke Paternal Grandmother    Hypertension Paternal Grandfather    Heart attack Paternal Grandfather    Diabetes Maternal Uncle    Stroke Maternal Aunt    Breast cancer Neg Hx        Mothers family   Cancer Neg Hx        Fathers family    Social History   Socioeconomic History   Marital status: Married    Spouse name: Not on file   Number of children: 2   Years of education: Not on file   Highest education level: Not on file  Occupational History   Occupation: Oceanographer  Tobacco Use   Smoking status: Every Day    Packs/day: 1.00    Years: 28.00    Pack years: 28.00    Types: Cigarettes   Smokeless tobacco: Never  Vaping Use   Vaping Use: Some days   Substances: Nicotine  Substance and Sexual Activity   Alcohol use: No   Drug use: No  Sexual activity: Yes    Partners: Male    Birth control/protection: Injection  Other Topics Concern   Not on file  Social History Narrative   Not on file   Social Determinants of Health   Financial Resource Strain: Not on file  Food Insecurity: Not on file  Transportation Needs: Not on file  Physical Activity: Not on file  Stress: Not on file  Social Connections: Not on file  Intimate Partner Violence: Not on file     Current Outpatient Medications:    albuterol (VENTOLIN HFA) 108 (90 Base) MCG/ACT inhaler, Inhale 2 puffs into the lungs every 6 (six) hours as needed for wheezing or shortness of breath., Disp: 8 g, Rfl: 3   hydrOXYzine (ATARAX/VISTARIL)  25 MG tablet, Take 1 tablet (25 mg total) by mouth 3 (three) times daily as needed for anxiety., Disp: 30 tablet, Rfl: 2   ibuprofen (ADVIL) 400 MG tablet, Take by mouth., Disp: , Rfl:    medroxyPROGESTERone (DEPO-PROVERA) 150 MG/ML injection, INJECT 1ML EVERY 11-12 WEEKS, Disp: , Rfl:    multivitamin-iron-minerals-folic acid (CENTRUM) chewable tablet, Chew 1 tablet by mouth daily., Disp: , Rfl:    amitriptyline (ELAVIL) 25 MG tablet, Take 1 tab PO qhs, Disp: 90 tablet, Rfl: 2   ondansetron (ZOFRAN ODT) 4 MG disintegrating tablet, Take 1 tablet (4 mg total) by mouth every 8 (eight) hours as needed for nausea or vomiting., Disp: 10 tablet, Rfl: 1   zolpidem (AMBIEN) 10 MG tablet, TAKE 1/2-1 TABLET BY MOUTH EVERY DAY AT BEDTIME AS NEEDED FOR SLEEP, Disp: 90 tablet, Rfl: 1  EXAM:  VITALS per patient if applicable: Ht 5' 3"  (1.6 m)   Wt 128 lb (58.1 kg)   BMI 22.67 kg/m   GENERAL: alert, oriented, appears well and in no acute distress  HEENT: atraumatic, conjunttiva clear, no obvious abnormalities on inspection of external nose and ears  NECK: normal movements of the head and neck  LUNGS: on inspection no signs of respiratory distress, breathing rate appears normal, no obvious gross SOB, gasping or wheezing  CV: no obvious cyanosis  MS: moves all visible extremities without noticeable abnormality  PSYCH/NEURO: pleasant and cooperative, no obvious depression or anxiety, speech and thought processing grossly intact  ASSESSMENT AND PLAN:  Discussed the following assessment and plan:  DDD (degenerative disc disease), cervical Epidural steroid injections discussed with her previously at her visit with Dr. Dianah Field.  She does not want to pursue these at this time due to cost.  She was referred to see pain management to review other potential remedies for long-term management of her pain.  Migraines Migraines are fairly well controlled with amitriptyline.  Continue at current  strength.  GAD (generalized anxiety disorder) Stable with Vistaril as needed.  Amitriptyline has had some antianxiety effect for her as well.  Primary insomnia Continue Ambien one half tab to 1 tab as needed each night.     I discussed the assessment and treatment plan with the patient. The patient was provided an opportunity to ask questions and all were answered. The patient agreed with the plan and demonstrated an understanding of the instructions.   The patient was advised to call back or seek an in-person evaluation if the symptoms worsen or if the condition fails to improve as anticipated.    Luetta Nutting, DO

## 2021-04-16 NOTE — Assessment & Plan Note (Signed)
Epidural steroid injections discussed with her previously at her visit with Dr. Dianah Field.  She does not want to pursue these at this time due to cost.  She was referred to see pain management to review other potential remedies for long-term management of her pain.

## 2021-04-16 NOTE — Assessment & Plan Note (Signed)
Continue Ambien one half tab to 1 tab as needed each night.

## 2021-04-16 NOTE — Assessment & Plan Note (Signed)
Stable with Vistaril as needed.  Amitriptyline has had some antianxiety effect for her as well.

## 2021-06-14 ENCOUNTER — Encounter: Payer: Self-pay | Admitting: Family Medicine

## 2021-07-16 ENCOUNTER — Encounter: Payer: Self-pay | Admitting: Family Medicine

## 2021-10-18 ENCOUNTER — Inpatient Hospital Stay (HOSPITAL_COMMUNITY)
Admission: EM | Admit: 2021-10-18 | Discharge: 2021-10-20 | DRG: 064 | Disposition: A | Discharge status: Home / Self Care | Payer: Self-pay | Attending: Family Medicine | Admitting: Family Medicine

## 2021-10-18 ENCOUNTER — Emergency Department (HOSPITAL_COMMUNITY): Payer: Self-pay

## 2021-10-18 ENCOUNTER — Other Ambulatory Visit: Payer: Self-pay

## 2021-10-18 ENCOUNTER — Observation Stay (HOSPITAL_COMMUNITY): Payer: Self-pay

## 2021-10-18 ENCOUNTER — Encounter (HOSPITAL_COMMUNITY): Payer: Self-pay | Admitting: Emergency Medicine

## 2021-10-18 DIAGNOSIS — R42 Dizziness and giddiness: Secondary | ICD-10-CM

## 2021-10-18 DIAGNOSIS — A4189 Other specified sepsis: Secondary | ICD-10-CM | POA: Diagnosis present

## 2021-10-18 DIAGNOSIS — R2681 Unsteadiness on feet: Secondary | ICD-10-CM | POA: Diagnosis present

## 2021-10-18 DIAGNOSIS — I6789 Other cerebrovascular disease: Secondary | ICD-10-CM

## 2021-10-18 DIAGNOSIS — K529 Noninfective gastroenteritis and colitis, unspecified: Secondary | ICD-10-CM

## 2021-10-18 DIAGNOSIS — G4700 Insomnia, unspecified: Secondary | ICD-10-CM | POA: Diagnosis present

## 2021-10-18 DIAGNOSIS — Z20822 Contact with and (suspected) exposure to covid-19: Secondary | ICD-10-CM | POA: Diagnosis present

## 2021-10-18 DIAGNOSIS — I63541 Cerebral infarction due to unspecified occlusion or stenosis of right cerebellar artery: Principal | ICD-10-CM | POA: Diagnosis present

## 2021-10-18 DIAGNOSIS — Z8249 Family history of ischemic heart disease and other diseases of the circulatory system: Secondary | ICD-10-CM

## 2021-10-18 DIAGNOSIS — Z79899 Other long term (current) drug therapy: Secondary | ICD-10-CM

## 2021-10-18 DIAGNOSIS — Z823 Family history of stroke: Secondary | ICD-10-CM

## 2021-10-18 DIAGNOSIS — E876 Hypokalemia: Secondary | ICD-10-CM | POA: Diagnosis not present

## 2021-10-18 DIAGNOSIS — Z793 Long term (current) use of hormonal contraceptives: Secondary | ICD-10-CM

## 2021-10-18 DIAGNOSIS — E785 Hyperlipidemia, unspecified: Secondary | ICD-10-CM | POA: Diagnosis present

## 2021-10-18 DIAGNOSIS — F17210 Nicotine dependence, cigarettes, uncomplicated: Secondary | ICD-10-CM | POA: Diagnosis present

## 2021-10-18 DIAGNOSIS — I639 Cerebral infarction, unspecified: Secondary | ICD-10-CM | POA: Diagnosis present

## 2021-10-18 DIAGNOSIS — R29702 NIHSS score 2: Secondary | ICD-10-CM | POA: Diagnosis present

## 2021-10-18 DIAGNOSIS — Z833 Family history of diabetes mellitus: Secondary | ICD-10-CM

## 2021-10-18 DIAGNOSIS — F419 Anxiety disorder, unspecified: Secondary | ICD-10-CM | POA: Diagnosis present

## 2021-10-18 DIAGNOSIS — K5100 Ulcerative (chronic) pancolitis without complications: Secondary | ICD-10-CM | POA: Diagnosis present

## 2021-10-18 HISTORY — DX: Dizziness and giddiness: R42

## 2021-10-18 HISTORY — DX: Noninfective gastroenteritis and colitis, unspecified: K52.9

## 2021-10-18 HISTORY — DX: Other cerebrovascular disease: I67.89

## 2021-10-18 LAB — RESP PANEL BY RT-PCR (FLU A&B, COVID) ARPGX2
Influenza A by PCR: NEGATIVE
Influenza B by PCR: NEGATIVE
SARS Coronavirus 2 by RT PCR: NEGATIVE

## 2021-10-18 LAB — URINALYSIS, ROUTINE W REFLEX MICROSCOPIC
Bilirubin Urine: NEGATIVE
Glucose, UA: NEGATIVE mg/dL
Hgb urine dipstick: NEGATIVE
Ketones, ur: NEGATIVE mg/dL
Leukocytes,Ua: NEGATIVE
Nitrite: NEGATIVE
Protein, ur: NEGATIVE mg/dL
Specific Gravity, Urine: 1.012 (ref 1.005–1.030)
pH: 5 (ref 5.0–8.0)

## 2021-10-18 LAB — CBG MONITORING, ED: Glucose-Capillary: 184 mg/dL — ABNORMAL HIGH (ref 70–99)

## 2021-10-18 LAB — COMPREHENSIVE METABOLIC PANEL WITH GFR
ALT: 12 U/L (ref 0–44)
AST: 13 U/L — ABNORMAL LOW (ref 15–41)
Albumin: 3.6 g/dL (ref 3.5–5.0)
Alkaline Phosphatase: 73 U/L (ref 38–126)
Anion gap: 10 (ref 5–15)
BUN: 12 mg/dL (ref 6–20)
CO2: 21 mmol/L — ABNORMAL LOW (ref 22–32)
Calcium: 8.3 mg/dL — ABNORMAL LOW (ref 8.9–10.3)
Chloride: 108 mmol/L (ref 98–111)
Creatinine, Ser: 0.68 mg/dL (ref 0.44–1.00)
GFR, Estimated: >60 mL/min
Glucose, Bld: 165 mg/dL — ABNORMAL HIGH (ref 70–99)
Potassium: 3.3 mmol/L — ABNORMAL LOW (ref 3.5–5.1)
Sodium: 139 mmol/L (ref 135–145)
Total Bilirubin: 0.4 mg/dL (ref 0.3–1.2)
Total Protein: 6.3 g/dL — ABNORMAL LOW (ref 6.5–8.1)

## 2021-10-18 LAB — CBC
HCT: 42.1 % (ref 36.0–46.0)
Hemoglobin: 13.4 g/dL (ref 12.0–15.0)
MCH: 29.9 pg (ref 26.0–34.0)
MCHC: 31.8 g/dL (ref 30.0–36.0)
MCV: 94 fL (ref 80.0–100.0)
Platelets: 368 10*3/uL (ref 150–400)
RBC: 4.48 MIL/uL (ref 3.87–5.11)
RDW: 12.2 % (ref 11.5–15.5)
WBC: 16.1 10*3/uL — ABNORMAL HIGH (ref 4.0–10.5)
nRBC: 0 % (ref 0.0–0.2)

## 2021-10-18 LAB — PREGNANCY, URINE: Preg Test, Ur: NEGATIVE

## 2021-10-18 LAB — TSH: TSH: 0.845 u[IU]/mL (ref 0.350–4.500)

## 2021-10-18 MED ORDER — METRONIDAZOLE 500 MG/100ML IV SOLN
500.0000 mg | Freq: Two times a day (BID) | INTRAVENOUS | Status: DC
  Administered 2021-10-18 – 2021-10-20 (×4): 500 mg via INTRAVENOUS
  Filled 2021-10-18 (×4): qty 100

## 2021-10-18 MED ORDER — MECLIZINE HCL 12.5 MG PO TABS
25.0000 mg | ORAL_TABLET | Freq: Three times a day (TID) | ORAL | Status: DC | PRN
  Filled 2021-10-18: qty 2

## 2021-10-18 MED ORDER — HYDROXYZINE HCL 25 MG PO TABS: 25.0000 mg | ORAL_TABLET | Freq: Three times a day (TID) | ORAL | Status: DC | PRN

## 2021-10-18 MED ORDER — HEPARIN SODIUM (PORCINE) 5000 UNIT/ML IJ SOLN
5000.0000 [IU] | Freq: Three times a day (TID) | INTRAMUSCULAR | Status: DC
  Administered 2021-10-18 – 2021-10-20 (×5): 5000 [IU] via SUBCUTANEOUS
  Filled 2021-10-18 (×5): qty 1

## 2021-10-18 MED ORDER — CHLORPROMAZINE HCL 25 MG/ML IJ SOLN
50.0000 mg | Freq: Once | INTRAMUSCULAR | Status: AC
  Administered 2021-10-18: 17:00:00 50 mg via INTRAMUSCULAR
  Filled 2021-10-18: qty 2

## 2021-10-18 MED ORDER — LORAZEPAM 2 MG/ML IJ SOLN
1.0000 mg | Freq: Four times a day (QID) | INTRAMUSCULAR | Status: DC | PRN
Start: 2021-10-18 — End: 2021-10-19
  Administered 2021-10-18 – 2021-10-19 (×2): 1 mg via INTRAVENOUS
  Filled 2021-10-18 (×2): qty 1

## 2021-10-18 MED ORDER — SODIUM CHLORIDE 0.9 % IV SOLN: 250.0000 mL | INTRAVENOUS | Status: DC | PRN

## 2021-10-18 MED ORDER — KETOROLAC TROMETHAMINE 30 MG/ML IJ SOLN
30.0000 mg | Freq: Once | INTRAMUSCULAR | Status: AC
  Administered 2021-10-18: 17:00:00 30 mg via INTRAVENOUS
  Filled 2021-10-18: qty 1

## 2021-10-18 MED ORDER — SODIUM CHLORIDE 0.9% FLUSH
3.0000 mL | Freq: Two times a day (BID) | INTRAVENOUS | Status: DC
  Administered 2021-10-18 – 2021-10-20 (×2): 3 mL via INTRAVENOUS

## 2021-10-18 MED ORDER — IOHEXOL 300 MG/ML  SOLN
100.0000 mL | Freq: Once | INTRAMUSCULAR | Status: AC | PRN
  Administered 2021-10-18: 11:00:00 100 mL via INTRAVENOUS

## 2021-10-18 MED ORDER — ATORVASTATIN CALCIUM 40 MG PO TABS
40.0000 mg | ORAL_TABLET | Freq: Every day | ORAL | Status: DC
  Administered 2021-10-20: 40 mg via ORAL
  Filled 2021-10-18 (×3): qty 1

## 2021-10-18 MED ORDER — ONDANSETRON HCL 4 MG/2ML IJ SOLN
4.0000 mg | Freq: Four times a day (QID) | INTRAMUSCULAR | Status: DC | PRN
  Administered 2021-10-19: 4 mg via INTRAVENOUS
  Filled 2021-10-18: qty 2

## 2021-10-18 MED ORDER — ONDANSETRON HCL 4 MG/2ML IJ SOLN
4.0000 mg | Freq: Once | INTRAMUSCULAR | Status: AC
  Administered 2021-10-18: 10:00:00 4 mg via INTRAVENOUS
  Filled 2021-10-18: qty 2

## 2021-10-18 MED ORDER — POTASSIUM CHLORIDE IN NACL 20-0.9 MEQ/L-% IV SOLN
INTRAVENOUS | Status: DC
Start: 2021-10-18 — End: 2021-10-20

## 2021-10-18 MED ORDER — PROCHLORPERAZINE EDISYLATE 10 MG/2ML IJ SOLN
10.0000 mg | Freq: Once | INTRAMUSCULAR | Status: AC
  Administered 2021-10-18: 08:00:00 10 mg via INTRAVENOUS
  Filled 2021-10-18: qty 2

## 2021-10-18 MED ORDER — SODIUM CHLORIDE 0.9 % IV BOLUS
1000.0000 mL | Freq: Once | INTRAVENOUS | Status: AC
  Administered 2021-10-18: 19:00:00 1000 mL via INTRAVENOUS

## 2021-10-18 MED ORDER — ACETAMINOPHEN 650 MG RE SUPP: 650.0000 mg | Freq: Four times a day (QID) | RECTAL | Status: DC | PRN

## 2021-10-18 MED ORDER — BISACODYL 10 MG RE SUPP: 10.0000 mg | Freq: Every day | RECTAL | Status: DC | PRN

## 2021-10-18 MED ORDER — CEFTRIAXONE SODIUM 2 G IJ SOLR
2.0000 g | INTRAMUSCULAR | Status: DC
Start: 2021-10-18 — End: 2021-10-20
  Administered 2021-10-18 – 2021-10-19 (×2): 2 g via INTRAVENOUS
  Filled 2021-10-18 (×2): qty 20

## 2021-10-18 MED ORDER — ASPIRIN 300 MG RE SUPP
300.0000 mg | Freq: Every day | RECTAL | Status: DC
  Administered 2021-10-18 – 2021-10-19 (×2): 300 mg via RECTAL
  Filled 2021-10-18 (×3): qty 1

## 2021-10-18 MED ORDER — AMITRIPTYLINE HCL 25 MG PO TABS
25.0000 mg | ORAL_TABLET | Freq: Every day | ORAL | Status: DC
  Administered 2021-10-19: 25 mg via ORAL
  Filled 2021-10-18 (×2): qty 1

## 2021-10-18 MED ORDER — SODIUM CHLORIDE 0.9% FLUSH: 3.0000 mL | INTRAVENOUS | Status: DC | PRN

## 2021-10-18 MED ORDER — POLYETHYLENE GLYCOL 3350 17 G PO PACK: 17.0000 g | PACK | Freq: Every day | ORAL | Status: DC | PRN

## 2021-10-18 MED ORDER — ONDANSETRON HCL 4 MG PO TABS: 4.0000 mg | ORAL_TABLET | Freq: Four times a day (QID) | ORAL | Status: DC | PRN

## 2021-10-18 MED ORDER — KETOROLAC TROMETHAMINE 15 MG/ML IJ SOLN: 15.0000 mg | Freq: Four times a day (QID) | INTRAMUSCULAR | Status: DC | PRN

## 2021-10-18 MED ORDER — SCOPOLAMINE 1 MG/3DAYS TD PT72
1.0000 | MEDICATED_PATCH | TRANSDERMAL | Status: DC
  Administered 2021-10-18: 17:00:00 1.5 mg via TRANSDERMAL
  Filled 2021-10-18: qty 1

## 2021-10-18 MED ORDER — TRAZODONE HCL 50 MG PO TABS: 50.0000 mg | ORAL_TABLET | Freq: Every evening | ORAL | Status: DC | PRN

## 2021-10-18 MED ORDER — ACETAMINOPHEN 325 MG PO TABS
650.0000 mg | ORAL_TABLET | Freq: Four times a day (QID) | ORAL | Status: DC | PRN
  Filled 2021-10-18: qty 2

## 2021-10-18 NOTE — ED Notes (Signed)
Pt refused to have oral temp taken had to do axillary ?

## 2021-10-18 NOTE — H&P (Signed)
Patient Demographics:    Nancy Cochran, is a 46 y.o. female  MRN: [id number]   DOB - 1975-09-11  Admit Date - 10/18/2021  Outpatient Primary MD for the patient is Luetta Nutting, DO   Assessment & Plan:   Assessment and Plan:    1) severe persistent vertigo with nausea and vomiting--- rule out CVA in the posterior circulation -MRI brain pending -May use scopolamine patch--- as patient is unable to take oral intake -As needed lorazepam and Zofran -Patient with intractable emesis due to severe persistent vertigo -Continue IV fluids until able to tolerate oral intake  2) sepsis secondary to pancolitis--- infectious versus inflammatory -Patient meets sepsis criteria with leukocytosis tachycardia and tachypnea -IV Rocephin and Flagyl pending culture data -Send stool for C. difficile and stool for GI pathogen  3) tobacco abuse--- smoking cessation advised  Disposition/Need for in-Hospital Stay- patient unable to be discharged at this time due to --- sepsis secondary to pancolitis requiring IV antibiotics, severe persistent vertigo with nausea and vomiting and inability to take oral intake requiring IV fluids  Dispo: The patient is from: Home              Anticipated d/c is to: Home              Anticipated d/c date is: 1 day              Patient currently is not medically stable to d/c. Barriers: Not Clinically Stable-    With History of - Reviewed by me  Past Medical History:  Diagnosis Date   DDD (degenerative disc disease), cervical 2016   Dr. Karie Schwalbe   Gestational diabetes       History reviewed. No pertinent surgical history.    Chief Complaint  Patient presents with   Emesis      HPI:    Nancy Cochran  is a 46 y.o. female patient with history of tobacco abuse, anxiety disorder and  insomnia presents with persistent vertigo associated with nausea and vomiting of less than 24-hour duration -1 episode of loose stools--stools are without blood or mucus -Emesis is without blood or bile -Patient is dry heaving -Becomes very vertiginous and nauseous with positional change -Unable to keep her eyes open for long.  Due to severe vertigo -Additional history obtained from patient's daughter at bedside -EDP attempted to give patient oral intake with patient had emesis  -In ED CT abdomen and pelvis showed pancolonic mild circumferential bowel wall thickening with relative sparing of the rectum suggestive of colitis infectious versus inflammatory -CT head negative MRI brain pending -UA is not suggestive of UTI -WBC 16.1 -Potassium is 3.3 glucose is 165 creatinine 0.68 LFTs are unremarkable -COVID and influenza negative   Review of systems:    In addition to the HPI above,   A full Review of  Systems was done, all other systems reviewed are negative except as noted above in HPI , .  Social History:  Reviewed by me    Social History   Tobacco Use   Smoking status: Every Day    Packs/day: 1.00    Years: 28.00    Pack years: 28.00    Types: Cigarettes   Smokeless tobacco: Never  Substance Use Topics   Alcohol use: No       Family History :  Reviewed by me    Family History  Problem Relation Age of Onset   Hypertension Maternal Grandmother    Heart attack Maternal Grandmother    Hypertension Maternal Grandfather    Heart attack Maternal Grandfather    Hypertension Paternal Grandmother    Heart attack Paternal Grandmother    Stroke Paternal Grandmother    Hypertension Paternal Grandfather    Heart attack Paternal Grandfather    Diabetes Maternal Uncle    Stroke Maternal Aunt    Breast cancer Neg Hx        Mothers family   Cancer Neg Hx        Fathers family     Home Medications:   Prior to Admission medications   Medication Sig Start Date End  Date Taking? Authorizing Provider  amitriptyline (ELAVIL) 25 MG tablet Take 1 tab PO qhs 04/13/21  Yes Luetta Nutting, DO  ibuprofen (ADVIL) 400 MG tablet Take by mouth.   Yes [provider]  medroxyPROGESTERone (DEPO-PROVERA) 150 MG/ML injection INJECT 1ML EVERY 11-12 WEEKS 10/15/17  Yes [provider]  ondansetron (ZOFRAN ODT) 4 MG disintegrating tablet Take 1 tablet (4 mg total) by mouth every 8 (eight) hours as needed for nausea or vomiting. 04/13/21  Yes Luetta Nutting, DO  zolpidem (AMBIEN) 10 MG tablet TAKE 1/2-1 TABLET BY MOUTH EVERY DAY AT BEDTIME AS NEEDED FOR SLEEP 04/13/21  Yes Luetta Nutting, DO  albuterol (VENTOLIN HFA) 108 (90 Base) MCG/ACT inhaler Inhale 2 puffs into the lungs every 6 (six) hours as needed for wheezing or shortness of breath. Patient not taking: Reported on 10/18/2021 01/07/20   Luetta Nutting, DO  hydrOXYzine (ATARAX/VISTARIL) 25 MG tablet Take 1 tablet (25 mg total) by mouth 3 (three) times daily as needed for anxiety. Patient not taking: Reported on 10/18/2021 02/23/20   Luetta Nutting, DO  multivitamin-iron-minerals-folic acid (CENTRUM) chewable tablet Chew 1 tablet by mouth daily. Patient not taking: Reported on 10/18/2021    [provider]     Allergies:    No Known Allergies   Physical Exam:   Vitals  Blood pressure 127/79, pulse (!) 103, temperature 98.5 F (36.9 C), temperature source Oral, resp. rate 20, height 5' 4"  (1.626 m), weight 63.5 kg, SpO2 99 %.  Physical Examination: General appearance - alert, ill-appearing Mental status - alert, oriented to person, place, and time, somewhat anxious Eyes - sclera anicteric Neck - supple, no JVD elevation , Chest - clear  to auscultation bilaterally, symmetrical air movement,  Heart - S1 and S2 normal, regular  Abdomen - soft, , nondistended, generalized abdominal discomfort on palpation worse on the left side, no rebound or guarding Neurological -neuro exam is very very limited as  patient is very vertiginous, becomes very nauseous with dry heaving with any attempts to change her position, she is moving all extremities spontaneously, horizontal  nystagmus noted -Extremities - no pedal edema noted, intact peripheral pulses  Skin - warm, dry     Data Review:    CBC Recent Labs  Lab 10/18/21 0805  WBC 16.1*  HGB 13.4  HCT 42.1  PLT 368  MCV 94.0  MCH 29.9  MCHC 31.8  RDW 12.2   ------------------------------------------------------------------------------------------------------------------  Chemistries  Recent Labs  Lab 10/18/21 0805  NA 139  K 3.3*  CL 108  CO2 21*  GLUCOSE 165*  BUN 12  CREATININE 0.68  CALCIUM 8.3*  AST 13*  ALT 12  ALKPHOS 73  BILITOT 0.4   ------------------------------------------------------------------------------------------------------------------ estimated creatinine clearance is 78.3 mL/min (by C-G formula based on SCr of 0.68 mg/dL). ------------------------------------------------------------------------------------------------------------------ No results for input(s): TSH, T4TOTAL, T3FREE, THYROIDAB in the last 72 hours.  Invalid input(s): FREET3   Coagulation profile No results for input(s): INR, PROTIME in the last 168 hours. ------------------------------------------------------------------------------------------------------------------- No results for input(s): DDIMER in the last 72 hours. -------------------------------------------------------------------------------------------------------------------  Cardiac Enzymes No results for input(s): CKMB, TROPONINI, MYOGLOBIN in the last 168 hours.  Invalid input(s): CK ------------------------------------------------------------------------------------------------------------------ No results found for: BNP   ---------------------------------------------------------------------------------------------------------------  Urinalysis    Component  Value Date/Time   COLORURINE YELLOW 10/18/2021 [place] 10/18/2021 0754   LABSPEC 1.012 10/18/2021 0754   PHURINE 5.0 10/18/2021 0754   GLUCOSEU NEGATIVE 10/18/2021 0754   HGBUR NEGATIVE 10/18/2021 0754   BILIRUBINUR NEGATIVE 10/18/2021 0754   KETONESUR NEGATIVE 10/18/2021 0754   PROTEINUR NEGATIVE 10/18/2021 0754   NITRITE NEGATIVE 10/18/2021 0754   LEUKOCYTESUR NEGATIVE 10/18/2021 0754    ----------------------------------------------------------------------------------------------------------------   Imaging Results:    CT Head Wo Contrast  Result Date: 10/18/2021 CLINICAL DATA:  Vertigo, central EXAM: CT HEAD WITHOUT CONTRAST TECHNIQUE: Contiguous axial images were obtained from the base of the skull through the vertex without intravenous contrast. RADIATION DOSE REDUCTION: This exam was performed according to the departmental dose-optimization program which includes automated exposure control, adjustment of the mA and/or kV according to patient size and/or use of iterative reconstruction technique. COMPARISON:  None. FINDINGS: Brain: There is no acute intracranial hemorrhage, mass effect, or edema. Gray-white differentiation is preserved. There is no extra-axial fluid collection. Ventricles and sulci are within normal limits in size and configuration. Vascular: No hyperdense vessel or unexpected calcification. Skull: Calvarium is unremarkable. Sinuses/Orbits: No acute finding. Other: None. IMPRESSION: No acute intracranial abnormality. Electronically Signed   By: Macy Mis M.D.   On: 10/18/2021 08:49   CT ABDOMEN PELVIS W CONTRAST  Result Date: 10/18/2021 CLINICAL DATA:  Left lower quadrant abdominal pain EXAM: CT ABDOMEN AND PELVIS WITH CONTRAST TECHNIQUE: Multidetector CT imaging of the abdomen and pelvis was performed using the standard protocol following bolus administration of intravenous contrast. RADIATION DOSE REDUCTION: This exam was performed according to  the departmental dose-optimization program which includes automated exposure control, adjustment of the mA and/or kV according to patient size and/or use of iterative reconstruction technique. CONTRAST:  159m OMNIPAQUE IOHEXOL 300 MG/ML  SOLN COMPARISON:  10/02/2007 FINDINGS: Lower chest: No acute abnormality. Hepatobiliary: No focal liver abnormality is seen. No gallstones, gallbladder wall thickening, or biliary dilatation. Pancreas: Unremarkable. No pancreatic ductal dilatation or surrounding inflammatory changes. Spleen: Normal in size without focal abnormality. Adrenals/Urinary Tract: Unremarkable adrenal glands. Kidneys enhance symmetrically without focal lesion, stone, or hydronephrosis. Ureters are nondilated. Urinary bladder appears unremarkable. Stomach/Bowel: Stomach and small bowel within normal limits. No dilated loops of bowel. Pancolonic mild circumferential bowel wall thickening with relative sparing of the rectum. Normal appendix in the right lower quadrant. Vascular/Lymphatic: Scattered aortoiliac atherosclerotic calcifications without aneurysm. No abdominopelvic lymphadenopathy. Reproductive: Uterus and bilateral adnexa are unremarkable. Other: No free fluid. No abdominopelvic fluid collection. No pneumoperitoneum. No abdominal wall hernia. Musculoskeletal: No acute or significant osseous  findings. IMPRESSION: Pancolonic mild circumferential bowel wall thickening with relative sparing of the rectum, suggestive of an infectious or inflammatory colitis. Aortic Atherosclerosis (ICD10-I70.0). Electronically Signed   By: Davina Poke D.O.   On: 10/18/2021 11:13    Radiological Exams on Admission: CT Head Wo Contrast  Result Date: 10/18/2021 CLINICAL DATA:  Vertigo, central EXAM: CT HEAD WITHOUT CONTRAST TECHNIQUE: Contiguous axial images were obtained from the base of the skull through the vertex without intravenous contrast. RADIATION DOSE REDUCTION: This exam was performed according to the  departmental dose-optimization program which includes automated exposure control, adjustment of the mA and/or kV according to patient size and/or use of iterative reconstruction technique. COMPARISON:  None. FINDINGS: Brain: There is no acute intracranial hemorrhage, mass effect, or edema. Gray-white differentiation is preserved. There is no extra-axial fluid collection. Ventricles and sulci are within normal limits in size and configuration. Vascular: No hyperdense vessel or unexpected calcification. Skull: Calvarium is unremarkable. Sinuses/Orbits: No acute finding. Other: None. IMPRESSION: No acute intracranial abnormality. Electronically Signed   By: Macy Mis M.D.   On: 10/18/2021 08:49   CT ABDOMEN PELVIS W CONTRAST  Result Date: 10/18/2021 CLINICAL DATA:  Left lower quadrant abdominal pain EXAM: CT ABDOMEN AND PELVIS WITH CONTRAST TECHNIQUE: Multidetector CT imaging of the abdomen and pelvis was performed using the standard protocol following bolus administration of intravenous contrast. RADIATION DOSE REDUCTION: This exam was performed according to the departmental dose-optimization program which includes automated exposure control, adjustment of the mA and/or kV according to patient size and/or use of iterative reconstruction technique. CONTRAST:  144m OMNIPAQUE IOHEXOL 300 MG/ML  SOLN COMPARISON:  10/02/2007 FINDINGS: Lower chest: No acute abnormality. Hepatobiliary: No focal liver abnormality is seen. No gallstones, gallbladder wall thickening, or biliary dilatation. Pancreas: Unremarkable. No pancreatic ductal dilatation or surrounding inflammatory changes. Spleen: Normal in size without focal abnormality. Adrenals/Urinary Tract: Unremarkable adrenal glands. Kidneys enhance symmetrically without focal lesion, stone, or hydronephrosis. Ureters are nondilated. Urinary bladder appears unremarkable. Stomach/Bowel: Stomach and small bowel within normal limits. No dilated loops of bowel. Pancolonic  mild circumferential bowel wall thickening with relative sparing of the rectum. Normal appendix in the right lower quadrant. Vascular/Lymphatic: Scattered aortoiliac atherosclerotic calcifications without aneurysm. No abdominopelvic lymphadenopathy. Reproductive: Uterus and bilateral adnexa are unremarkable. Other: No free fluid. No abdominopelvic fluid collection. No pneumoperitoneum. No abdominal wall hernia. Musculoskeletal: No acute or significant osseous findings. IMPRESSION: Pancolonic mild circumferential bowel wall thickening with relative sparing of the rectum, suggestive of an infectious or inflammatory colitis. Aortic Atherosclerosis (ICD10-I70.0). Electronically Signed   By: NDavina PokeD.O.   On: 10/18/2021 11:13    DVT Prophylaxis -SCD /heparin AM Labs Ordered, also please review Full Orders  Family Communication: Admission, patients condition and plan of care including tests being ordered have been discussed with the patient and daughter at bedside who indicate understanding and agree with the plan   Code Status - Full Code  Likely DC to home if tolerating oral intake  Condition   stable  CRoxan HockeyM.D on 10/18/2021 at 6:52 PM Go to [email or web address] -  for contact info  [facility] - Office  [phone]

## 2021-10-18 NOTE — ED Notes (Signed)
Pt given water 

## 2021-10-18 NOTE — Plan of Care (Signed)
Pt brought to the floor from the ED, daughter at bedside at this time.  ?

## 2021-10-18 NOTE — ED Notes (Signed)
Pt refused trying to ambulate to the restroom. Purewick placed on pt. Nurse notified ?

## 2021-10-18 NOTE — ED Triage Notes (Signed)
Pt BIB EMS from home with N/V and dizziness. Pt has hx of vertigo. Pt had 63m zofran and 401mNS with ems. Pt being uncooperative with triage.  ?

## 2021-10-18 NOTE — Progress Notes (Addendum)
Dr. Joesph Fillers called and spoke with daughter and patient about patient condition.  Both verbalized understanding and needing for further testing during hospital stay.  Primary nurse notified  patient placed on bed alarm,and will carry out orders (aspirin 300 rectally and lipitor)  ?

## 2021-10-18 NOTE — ED Provider Notes (Signed)
[facility] EMERGENCY DEPARTMENT Provider Note   CSN: [id number] Arrival date & time: 10/18/21  1324     History  Chief Complaint  Patient presents with   Emesis    Nancy Cochran is a 46 y.o. female.   Emesis Patient presents with nausea vomiting diarrhea.  States she woke up feeling this.  States that she feels as if she opens her eyes she will throw up and have diarrhea on herself.  Patient states she cannot move.  No headache.  Does have history of vertigo.  Somewhat difficult to get history from.  Will follow commands to move her eyes when I open her eyes up.  Will not move her extremities to command.  However with patient becoming nauseous that she began to gag contracted all extremities and rolled over to the side on her own.   Past Medical History:  Diagnosis Date   DDD (degenerative disc disease), cervical 2016   Dr. Karie Schwalbe   Gestational diabetes   History reviewed. No pertinent surgical history.   Home Medications Prior to Admission medications   Medication Sig Start Date End Date Taking? Authorizing Provider  amitriptyline (ELAVIL) 25 MG tablet Take 1 tab PO qhs 04/13/21  Yes Luetta Nutting, DO  ibuprofen (ADVIL) 400 MG tablet Take by mouth.   Yes [provider]  medroxyPROGESTERone (DEPO-PROVERA) 150 MG/ML injection INJECT 1ML EVERY 11-12 WEEKS 10/15/17  Yes [provider]  ondansetron (ZOFRAN ODT) 4 MG disintegrating tablet Take 1 tablet (4 mg total) by mouth every 8 (eight) hours as needed for nausea or vomiting. 04/13/21  Yes Luetta Nutting, DO  zolpidem (AMBIEN) 10 MG tablet TAKE 1/2-1 TABLET BY MOUTH EVERY DAY AT BEDTIME AS NEEDED FOR SLEEP 04/13/21  Yes Luetta Nutting, DO  albuterol (VENTOLIN HFA) 108 (90 Base) MCG/ACT inhaler Inhale 2 puffs into the lungs every 6 (six) hours as needed for wheezing or shortness of breath. Patient not taking: Reported on 10/18/2021 01/07/20   Luetta Nutting, DO  hydrOXYzine (ATARAX/VISTARIL) 25 MG tablet Take 1 tablet  (25 mg total) by mouth 3 (three) times daily as needed for anxiety. Patient not taking: Reported on 10/18/2021 02/23/20   Luetta Nutting, DO  multivitamin-iron-minerals-folic acid (CENTRUM) chewable tablet Chew 1 tablet by mouth daily. Patient not taking: Reported on 10/18/2021    [provider]      Allergies    Patient has no known allergies.    Review of Systems   Review of Systems  Gastrointestinal:  Positive for vomiting.   Physical Exam Updated Vital Signs BP 119/85    Pulse (!) 103    Temp (!) 96.9 F (36.1 C) (Axillary)    Resp 19    Ht 5' 4"  (1.626 m)    Wt 63.5 kg    SpO2 97%    BMI 24.03 kg/m  Physical Exam Vitals and nursing note reviewed.  Eyes:     Extraocular Movements: Extraocular movements intact.  Cardiovascular:     Rate and Rhythm: Regular rhythm.  Pulmonary:     Breath sounds: No wheezing.  Musculoskeletal:        General: No tenderness.  Neurological:     Mental Status: She is alert.     Comments: States she cannot move any of her extremities, however did spontaneously move them all and rolled over to her side when she was becoming nauseous.    ED Results / Procedures / Treatments   Labs (all labs ordered are listed, but only abnormal  results are displayed) Labs Reviewed  COMPREHENSIVE METABOLIC PANEL - Abnormal; Notable for the following components:      Result Value   Potassium 3.3 (*)    CO2 21 (*)    Glucose, Bld 165 (*)    Calcium 8.3 (*)    Total Protein 6.3 (*)    AST 13 (*)    All other components within normal limits  CBC - Abnormal; Notable for the following components:   WBC 16.1 (*)    All other components within normal limits  CBG MONITORING, ED - Abnormal; Notable for the following components:   Glucose-Capillary 184 (*)    All other components within normal limits  RESP PANEL BY RT-PCR (FLU A&B, COVID) [id number]  PREGNANCY, URINE  URINALYSIS, ROUTINE W REFLEX MICROSCOPIC    EKG None  Radiology CT Head Wo  Contrast  Result Date: 10/18/2021 CLINICAL DATA:  Vertigo, central EXAM: CT HEAD WITHOUT CONTRAST TECHNIQUE: Contiguous axial images were obtained from the base of the skull through the vertex without intravenous contrast. RADIATION DOSE REDUCTION: This exam was performed according to the departmental dose-optimization program which includes automated exposure control, adjustment of the mA and/or kV according to patient size and/or use of iterative reconstruction technique. COMPARISON:  None. FINDINGS: Brain: There is no acute intracranial hemorrhage, mass effect, or edema. Gray-white differentiation is preserved. There is no extra-axial fluid collection. Ventricles and sulci are within normal limits in size and configuration. Vascular: No hyperdense vessel or unexpected calcification. Skull: Calvarium is unremarkable. Sinuses/Orbits: No acute finding. Other: None. IMPRESSION: No acute intracranial abnormality. Electronically Signed   By: Macy Mis M.D.   On: 10/18/2021 08:49   CT ABDOMEN PELVIS W CONTRAST  Result Date: 10/18/2021 CLINICAL DATA:  Left lower quadrant abdominal pain EXAM: CT ABDOMEN AND PELVIS WITH CONTRAST TECHNIQUE: Multidetector CT imaging of the abdomen and pelvis was performed using the standard protocol following bolus administration of intravenous contrast. RADIATION DOSE REDUCTION: This exam was performed according to the departmental dose-optimization program which includes automated exposure control, adjustment of the mA and/or kV according to patient size and/or use of iterative reconstruction technique. CONTRAST:  142m OMNIPAQUE IOHEXOL 300 MG/ML  SOLN COMPARISON:  10/02/2007 FINDINGS: Lower chest: No acute abnormality. Hepatobiliary: No focal liver abnormality is seen. No gallstones, gallbladder wall thickening, or biliary dilatation. Pancreas: Unremarkable. No pancreatic ductal dilatation or surrounding inflammatory changes. Spleen: Normal in size without focal abnormality.  Adrenals/Urinary Tract: Unremarkable adrenal glands. Kidneys enhance symmetrically without focal lesion, stone, or hydronephrosis. Ureters are nondilated. Urinary bladder appears unremarkable. Stomach/Bowel: Stomach and small bowel within normal limits. No dilated loops of bowel. Pancolonic mild circumferential bowel wall thickening with relative sparing of the rectum. Normal appendix in the right lower quadrant. Vascular/Lymphatic: Scattered aortoiliac atherosclerotic calcifications without aneurysm. No abdominopelvic lymphadenopathy. Reproductive: Uterus and bilateral adnexa are unremarkable. Other: No free fluid. No abdominopelvic fluid collection. No pneumoperitoneum. No abdominal wall hernia. Musculoskeletal: No acute or significant osseous findings. IMPRESSION: Pancolonic mild circumferential bowel wall thickening with relative sparing of the rectum, suggestive of an infectious or inflammatory colitis. Aortic Atherosclerosis (ICD10-I70.0). Electronically Signed   By: NDavina PokeD.O.   On: 10/18/2021 11:13    Procedures Procedures    Medications Ordered in ED Medications  prochlorperazine (COMPAZINE) injection 10 mg (10 mg Intravenous Given 10/18/21 0757)  ondansetron (ZOFRAN) injection 4 mg (4 mg Intravenous Given 10/18/21 1006)  iohexol (OMNIPAQUE) 300 MG/ML solution 100 mL (100 mLs Intravenous Contrast Given 10/18/21 1030)  ED Course/ Medical Decision Making/ A&P                           Medical Decision Making Amount and/or Complexity of Data Reviewed Labs: ordered. Radiology: ordered.  Risk Prescription drug management.  Initial differential diagnosis is broad for dizziness abdominal pain and diarrhea Patient presents with various complaints.  Somewhat difficult to get history.  States she is dizzy.  States nausea vomiting and diarrhea.  Somewhat varus exam and at the beginning she stated she was paralyzed.  However has moved all extremities.  States she feels bad and would  rather be dead.  White count is elevated.  CT scan independently interpreted and also radiology report reviewed and shows colitis.  Still not able to tolerate orals despite Compazine and Zofran.  Continued pain.  Will discuss with hospitalist for admission since she cannot tolerate orals at this time.        Final Clinical Impression(s) / ED Diagnoses Final diagnoses:  Colitis    Rx / DC Orders ED Discharge Orders     None         Davonna Belling, MD 10/18/21 1417

## 2021-10-19 ENCOUNTER — Observation Stay (HOSPITAL_COMMUNITY): Payer: Self-pay

## 2021-10-19 DIAGNOSIS — I6389 Other cerebral infarction: Secondary | ICD-10-CM

## 2021-10-19 DIAGNOSIS — I639 Cerebral infarction, unspecified: Secondary | ICD-10-CM | POA: Diagnosis present

## 2021-10-19 LAB — BASIC METABOLIC PANEL
Anion gap: 7 (ref 5–15)
BUN: 12 mg/dL (ref 6–20)
CO2: 22 mmol/L (ref 22–32)
Calcium: 8.2 mg/dL — ABNORMAL LOW (ref 8.9–10.3)
Chloride: 111 mmol/L (ref 98–111)
Creatinine, Ser: 0.68 mg/dL (ref 0.44–1.00)
GFR, Estimated: >60 mL/min (ref >60)
Glucose, Bld: 87 mg/dL (ref 70–99)
Potassium: 3.4 mmol/L — ABNORMAL LOW (ref 3.5–5.1)
Sodium: 140 mmol/L (ref 135–145)

## 2021-10-19 LAB — CBC
HCT: 36.1 % (ref 36.0–46.0)
Hemoglobin: 11.8 g/dL — ABNORMAL LOW (ref 12.0–15.0)
MCH: 30.6 pg (ref 26.0–34.0)
MCHC: 32.7 g/dL (ref 30.0–36.0)
MCV: 93.5 fL (ref 80.0–100.0)
Platelets: 329 10*3/uL (ref 150–400)
RBC: 3.86 MIL/uL — ABNORMAL LOW (ref 3.87–5.11)
RDW: 12.4 % (ref 11.5–15.5)
WBC: 17.2 10*3/uL — ABNORMAL HIGH (ref 4.0–10.5)
nRBC: 0 % (ref 0.0–0.2)

## 2021-10-19 LAB — LIPID PANEL
Cholesterol: 182 mg/dL (ref 0–200)
HDL: 28 mg/dL — ABNORMAL LOW (ref >40)
LDL Cholesterol: 126 mg/dL — ABNORMAL HIGH (ref 0–99)
Total CHOL/HDL Ratio: 6.5 RATIO
Triglycerides: 139 mg/dL (ref <150)
VLDL: 28 mg/dL (ref 0–40)

## 2021-10-19 LAB — ECHOCARDIOGRAM COMPLETE
AR max vel: 2.18 cm2
AV Area VTI: 2.06 cm2
AV Area mean vel: 2.09 cm2
AV Mean grad: 3 mmHg
AV Peak grad: 6.8 mmHg
Ao pk vel: 1.3 m/s
Area-P 1/2: 5.46 cm2
Calc EF: 55.2 %
Height: 64 in
MV VTI: 2.56 cm2
S' Lateral: 2.4 cm
Single Plane A2C EF: 59.1 %
Single Plane A4C EF: 51.9 %
Weight: 2240 oz

## 2021-10-19 LAB — HEMOGLOBIN A1C
Hgb A1c MFr Bld: 5.2 % (ref 4.8–5.6)
Mean Plasma Glucose: 102.54 mg/dL

## 2021-10-19 LAB — HIV ANTIBODY (ROUTINE TESTING W REFLEX): HIV Screen 4th Generation wRfx: NONREACTIVE

## 2021-10-19 MED ORDER — POTASSIUM CHLORIDE CRYS ER 20 MEQ PO TBCR
40.0000 meq | EXTENDED_RELEASE_TABLET | ORAL | Status: AC
Start: 2021-10-19 — End: 2021-10-19

## 2021-10-19 MED ORDER — IOHEXOL 350 MG/ML SOLN
75.0000 mL | Freq: Once | INTRAVENOUS | Status: AC | PRN
  Administered 2021-10-19: 75 mL via INTRAVENOUS

## 2021-10-19 MED ORDER — DIAZEPAM 2 MG PO TABS: 2.0000 mg | ORAL_TABLET | Freq: Three times a day (TID) | ORAL | Status: DC | PRN

## 2021-10-19 MED ORDER — POTASSIUM CHLORIDE 10 MEQ/100ML IV SOLN
10.0000 meq | INTRAVENOUS | Status: AC
  Administered 2021-10-19 (×4): 10 meq via INTRAVENOUS
  Filled 2021-10-19 (×4): qty 100

## 2021-10-19 MED ORDER — LORAZEPAM 2 MG/ML IJ SOLN
1.0000 mg | Freq: Once | INTRAMUSCULAR | Status: AC
Start: 2021-10-19 — End: 2021-10-19
  Administered 2021-10-19: 1 mg via INTRAVENOUS
  Filled 2021-10-19: qty 1

## 2021-10-19 MED ORDER — DIAZEPAM 5 MG/ML IJ SOLN
2.5000 mg | Freq: Three times a day (TID) | INTRAMUSCULAR | Status: DC | PRN
  Administered 2021-10-19: 2.5 mg via INTRAVENOUS
  Filled 2021-10-19: qty 2

## 2021-10-19 NOTE — Progress Notes (Signed)
PT worked with patient and she became very dizzy and began vomiting violently   administered PRN ativan IV . Notified Dr. Joesph Fillers he may want to change patients PO medications to IV at this time.  ?

## 2021-10-19 NOTE — Progress Notes (Signed)
*  PRELIMINARY RESULTS* ?Echocardiogram ?2D Echocardiogram has been performed. ? ?Nancy Cochran ?10/19/2021, 9:29 AM ?

## 2021-10-19 NOTE — Progress Notes (Signed)
?  Transition of Care (TOC) Screening Note ? ? ?Patient Details  ?Name: Nancy Cochran ?Date of Birth: 04-Nov-1975 ? ? ?Transition of Care (TOC) CM/SW Contact:    ?Iona Beard, LCSWA ?Phone Number: ?10/19/2021, 12:49 PM ? ?CIR is evaluating pt for possible admission. TOC to follow for needs.  ? ?Transition of Care Department [facility]) has reviewed patient and no TOC needs have been identified at this time. We will continue to monitor patient advancement through interdisciplinary progression rounds. If new patient transition needs arise, please place a TOC consult. ?  ?

## 2021-10-19 NOTE — Progress Notes (Signed)
PROGRESS NOTE     Nancy Cochran, is a 46 y.o. female, DOB - [birth year], HYQ:[id number]  Admit date - 10/18/2021   Admitting Physician Tacha Manni Denton Brick, MD  Outpatient Primary MD for the patient is Luetta Nutting, DO  LOS - 0  Chief Complaint  Patient presents with   Emesis       Brief Narrative:  46 y.o. female patient with history of tobacco abuse, anxiety disorder and insomnia admitted on 10/18/2021 with acute right cerebellar stroke after presenting with with persistent vertigo and intractable emesis    -Assessment and Plan:  1)Acute right cerebellar infarct-- MRI brain confirms Small acute ischemic nonhemorrhagic infarct involving the superior right cerebellum -MRA head and CTA neck without LVO -Neurology consult appreciate -Neurologist ordered hypercoagulability work-up -Aspirin and Lipitor as ordered -LDL 126 HDL 28, total cholesterol 182 -A1c 5.2, TSH 0.845 -Allow permissive hypertension -PT and OT eval appreciated recommends inpatient rehab -Continue to monitor on telemetry -Echo with EF of 55 to 60% with normal diastolic function and no wall motion abnormalities, no atrial enlargement, no aortic stenosis -May benefit from 30-day event monitor  2)Hypokalemia--due to GI losses and poor oral intake, replace and recheck -  3)Intractable emesis and persistent vertigo--due to posterior circulation acute stroke as above #1 -Continue scopolamine patch -Valium as needed -Zofran as needed --Continue IV fluids until able to tolerate oral intake  4)Sepsis secondary to pancolitis--- infectious versus inflammatory -Patient meets sepsis criteria with leukocytosis tachycardia and tachypnea -Continue IV Rocephin and Flagyl pending culture data -Patient without diarrhea so unable to send stool for C. difficile and stool for GI pathogen   5)Tobacco Abuse--- smoking cessation advised,   Disposition/Need for in-Hospital Stay- patient unable to be discharged at this time due to  --- sepsis secondary to pancolitis requiring IV antibiotics, severe persistent vertigo with nausea and vomiting and inability to take oral intake requiring IV fluids -Patient will need inpatient rehab due to acute stroke with very unsteady gait   Dispo: The patient is from: Home              Anticipated d/c is to: [facility]              Anticipated d/c date is: 1 day              Patient currently is not medically stable to d/c. Barriers: Not Clinically Stable-   Code Status :  -  Code Status: Full Code   Family Communication:   (patient is alert, awake and coherent) Discussed with patient's daughter Nancy Cochran and patient's sister at bedside  DVT Prophylaxis  :   - SCDs   heparin injection 5,000 Units Start: 10/18/21 2200 SCDs Start: 10/18/21 1452 Place TED hose Start: 10/18/21 1452   Lab Results  Component Value Date   PLT 329 10/19/2021    Inpatient Medications  Scheduled Meds:  amitriptyline  25 mg Oral QHS   aspirin  300 mg Rectal Daily   atorvastatin  40 mg Oral Daily   heparin  5,000 Units Subcutaneous Q8H   potassium chloride  40 mEq Oral Q3H   scopolamine  1 patch Transdermal Q72H   sodium chloride flush  3 mL Intravenous Q12H   sodium chloride flush  3 mL Intravenous Q12H   Continuous Infusions:  sodium chloride     0.9 % NaCl with KCl 20 mEq / L 125 mL/hr at 10/19/21 0607   cefTRIAXone (ROCEPHIN)  IV 2 g (10/18/21 1644)   metronidazole 500 mg (10/19/21  1026)   PRN Meds:.sodium chloride, acetaminophen **OR** acetaminophen, bisacodyl, diazepam **OR** diazepam, ketorolac, ondansetron **OR** ondansetron (ZOFRAN) IV, polyethylene glycol, sodium chloride flush, traZODone   Anti-infectives (From admission, onward)    Start     Dose/Rate Route Frequency Ordered Stop   10/18/21 1500  cefTRIAXone (ROCEPHIN) 2 g in sodium chloride 0.9 % 100 mL IVPB        2 g 200 mL/hr over 30 Minutes Intravenous Every 24 hours 10/18/21 1455     10/18/21 1500  metroNIDAZOLE (FLAGYL) IVPB 500  mg        500 mg 100 mL/hr over 60 Minutes Intravenous Every 12 hours 10/18/21 1455           Subjective: Nancy Cochran today has no fevers, no emesis,  No chest pain,   -Remains very nauseous, -- significant vertigo and unsteady gait persist Patient's daughter and sister at bedside  Objective: Vitals:   10/18/21 1624 10/18/21 1912 10/19/21 0300 10/19/21 1406  BP: 127/79 117/74 110/72 126/86  Pulse: (!) 103 (!) 110 (!) 102 (!) 102  Resp: 20 19 20 18   Temp: 98.5 F (36.9 C) 98.6 F (37 C) 98.8 F (37.1 C) 98.5 F (36.9 C)  TempSrc: Oral Oral Oral Oral  SpO2: 99% 98% 97% 100%  Weight:      Height:        Intake/Output Summary (Last 24 hours) at 10/19/2021 1420 Last data filed at 10/19/2021 1300 Gross per 24 hour  Intake 2476.24 ml  Output --  Net 2476.24 ml   Filed Weights   10/18/21 0741  Weight: 63.5 kg    Physical Exam  Gen:- Awake Alert,  in no apparent distress  HEENT:- [place].AT, No sclera icterus Neck-Supple Neck,No JVD,.  Lungs-  CTAB , fair symmetrical air movement CV- S1, S2 normal, regular  Abd-  +ve B.Sounds, Abd Soft, very mild abdominal discomfort on palpation Extremity/Skin:- No  edema, pedal pulses present  Psych-affect is flat, oriented x3 Neuro-significant problem with coordination of the right upper extremity , vestibular/balance and gait problems  Data Reviewed: I have personally reviewed following labs and imaging studies  CBC: Recent Labs  Lab 10/18/21 0805 10/19/21 0456  WBC 16.1* 17.2*  HGB 13.4 11.8*  HCT 42.1 36.1  MCV 94.0 93.5  PLT 368 086   Basic Metabolic Panel: Recent Labs  Lab 10/18/21 0805 10/19/21 0456  NA 139 140  K 3.3* 3.4*  CL 108 111  CO2 21* 22  GLUCOSE 165* 87  BUN 12 12  CREATININE 0.68 0.68  CALCIUM 8.3* 8.2*   GFR: Estimated Creatinine Clearance: 78.3 mL/min (by C-G formula based on SCr of 0.68 mg/dL). Liver Function Tests: Recent Labs  Lab 10/18/21 0805  AST 13*  ALT 12  ALKPHOS 73  BILITOT  0.4  PROT 6.3*  ALBUMIN 3.6   Cardiac Enzymes: No results for input(s): CKTOTAL, CKMB, CKMBINDEX, TROPONINI in the last 168 hours. BNP (last 3 results) No results for input(s): PROBNP in the last 8760 hours. HbA1C: Recent Labs    10/18/21 0805  HGBA1C 5.2   Sepsis Labs: @LABRCNTIP (procalcitonin:4,lacticidven:4) ) Recent Results (from the past 240 hour(s))  Resp Panel by RT-PCR (Flu A&B, Covid) Nasopharyngeal Swab     Status: None   Collection Time: 10/18/21 10:07 AM   Specimen: Nasopharyngeal Swab; Nasopharyngeal(NP) swabs in vial transport medium  Result Value Ref Range Status   SARS Coronavirus 2 by RT PCR NEGATIVE NEGATIVE Final    Comment: (NOTE) SARS-CoV-2 target nucleic acids  are NOT DETECTED.  The SARS-CoV-2 RNA is generally detectable in upper respiratory specimens during the acute phase of infection. The lowest concentration of SARS-CoV-2 viral copies this assay can detect is 138 copies/mL. A negative result does not preclude SARS-Cov-2 infection and should not be used as the sole basis for treatment or other patient management decisions. A negative result may occur with  improper specimen collection/handling, submission of specimen other than nasopharyngeal swab, presence of viral mutation(s) within the areas targeted by this assay, and inadequate number of viral copies(<138 copies/mL). A negative result must be combined with clinical observations, patient history, and epidemiological information. The expected result is Negative.  Fact Sheet for Patients:  EntrepreneurPulse.com.au  Fact Sheet for Healthcare Providers:  IncredibleEmployment.be  This test is no t yet approved or cleared by the [place] FDA and  has been authorized for detection and/or diagnosis of SARS-CoV-2 by FDA under an Emergency Use Authorization (EUA). This EUA will remain  in effect (meaning this test can be used) for the duration of the COVID-19  declaration under Section 564(b)(1) of the Act, 21 U.S.C.section 360bbb-3(b)(1), unless the authorization is terminated  or revoked sooner.       Influenza A by PCR NEGATIVE NEGATIVE Final   Influenza B by PCR NEGATIVE NEGATIVE Final    Comment: (NOTE) The Xpert Xpress SARS-CoV-2/FLU/RSV plus assay is intended as an aid in the diagnosis of influenza from Nasopharyngeal swab specimens and should not be used as a sole basis for treatment. Nasal washings and aspirates are unacceptable for Xpert Xpress SARS-CoV-2/FLU/RSV testing.  Fact Sheet for Patients: EntrepreneurPulse.com.au  Fact Sheet for Healthcare Providers: IncredibleEmployment.be  This test is not yet approved or cleared by the [place] FDA and has been authorized for detection and/or diagnosis of SARS-CoV-2 by FDA under an Emergency Use Authorization (EUA). This EUA will remain in effect (meaning this test can be used) for the duration of the COVID-19 declaration under Section 564(b)(1) of the Act, 21 U.S.C. section 360bbb-3(b)(1), unless the authorization is terminated or revoked.  Performed at [facility], [address] [place], [place] [address]       Radiology Studies: CT Head Wo Contrast  Result Date: 10/18/2021 CLINICAL DATA:  Vertigo, central EXAM: CT HEAD WITHOUT CONTRAST TECHNIQUE: Contiguous axial images were obtained from the base of the skull through the vertex without intravenous contrast. RADIATION DOSE REDUCTION: This exam was performed according to the departmental dose-optimization program which includes automated exposure control, adjustment of the mA and/or kV according to patient size and/or use of iterative reconstruction technique. COMPARISON:  None. FINDINGS: Brain: There is no acute intracranial hemorrhage, mass effect, or edema. Gray-white differentiation is preserved. There is no extra-axial fluid collection. Ventricles and sulci are within normal limits  in size and configuration. Vascular: No hyperdense vessel or unexpected calcification. Skull: Calvarium is unremarkable. Sinuses/Orbits: No acute finding. Other: None. IMPRESSION: No acute intracranial abnormality. Electronically Signed   By: Macy Mis M.D.   On: 10/18/2021 08:49   CT ANGIO NECK W OR WO CONTRAST  Result Date: 10/19/2021 CLINICAL DATA:  Nausea, vomiting, dizziness, history of vertigo EXAM: CT ANGIOGRAPHY NECK TECHNIQUE: Multidetector CT imaging of the neck was performed using the standard protocol during bolus administration of intravenous contrast. Multiplanar CT image reconstructions and MIPs were obtained to evaluate the vascular anatomy. Carotid stenosis measurements (when applicable) are obtained utilizing NASCET criteria, using the distal internal carotid diameter as the denominator. RADIATION DOSE REDUCTION: This exam was performed according to the  departmental dose-optimization program which includes automated exposure control, adjustment of the mA and/or kV according to patient size and/or use of iterative reconstruction technique. CONTRAST:  67m OMNIPAQUE IOHEXOL 350 MG/ML SOLN COMPARISON:  None. FINDINGS: Aortic arch: The aortic arch is unremarkable. There is a common origin of the brachiocephalic and left common carotid arteries, a normal variant. The subclavian arteries are patent to the level imaged. Right carotid system: Right common, internal, and external carotid arteries are patent, without hemodynamically significant stenosis or occlusion. There is no dissection or aneurysm. Left carotid system: The left common, internal, and external carotid arteries are patent, without hemodynamically significant stenosis or occlusion. There is no dissection or aneurysm. Vertebral arteries: The vertebral arteries are widely patent, without hemodynamically significant stenosis or occlusion. There is no evidence of dissection or aneurysm. Skeleton: There is no acute osseous abnormality or  aggressive osseous lesion. There is no significant degenerative change in the cervical spine. There is no visible canal hematoma. Other neck: The soft tissues are unremarkable. Upper chest: The imaged lung apices are clear. IMPRESSION: Normal CTA neck. No hemodynamically significant stenosis, occlusion, dissection, or aneurysm. Electronically Signed   By: PValetta MoleM.D.   On: 10/19/2021 13:02   MR ANGIO HEAD WO CONTRAST  Result Date: 10/18/2021 CLINICAL DATA:  Initial evaluation for acute dizziness. EXAM: MRI HEAD WITHOUT CONTRAST MRA HEAD WITHOUT CONTRAST TECHNIQUE: Multiplanar, multi-echo pulse sequences of the brain and surrounding structures were acquired without intravenous contrast. Angiographic images of the Circle of Willis were acquired using MRA technique without intravenous contrast. COMPARISON:  Prior head CT from earlier the same day. FINDINGS: MRI HEAD FINDINGS Brain: Cerebral volume within normal limits. Few scattered subcentimeter foci of T2/FLAIR hyperintensity noted involving the supratentorial cerebral white matter, nonspecific, but overall minimal in nature, and of doubtful significance. Small focus of restricted diffusion seen involving the superior right cerebellum and cerebellar vermis, consistent with an acute ischemic infarct (series 5, images 11-7). No associated hemorrhage or mass effect. No other evidence for acute or subacute ischemia. Gray-white matter differentiation otherwise maintained. No other areas of remote cortical infarction. No acute or chronic intracranial blood products. No mass lesion or midline shift. No hydrocephalus or extra-axial fluid collection. Pituitary gland suprasellar region within normal limits. Midline structures intact. Vascular: Major intracranial vascular flow voids are maintained. Skull and upper cervical spine: Craniocervical junction within normal limits. Bone marrow signal intensity normal. No scalp soft tissue abnormality. Sinuses/Orbits: Globes  orbital soft tissues within normal limits. Scattered mucosal thickening noted about the ethmoidal air cells and maxillary sinuses. Paranasal sinuses are otherwise clear. No mastoid effusion. Other: None. MRA HEAD FINDINGS Anterior circulation: Examination moderately degraded by motion artifact. Visualized distal cervical segments of the internal carotid arteries are patent with antegrade flow. Petrous, cavernous, and supraclinoid segments patent without hemodynamically significant stenosis or other abnormality. A1 segments patent bilaterally. Normal anterior communicating artery complex. Both ACAs patent to their distal aspects without stenosis. M1 segments widely patent bilaterally. Left M1 is fenestrated distally. Normal MCA bifurcations. Distal MCA branches perfused and symmetric. Posterior circulation: Visualized V4 segments widely patent. Right PICA patent at its origin. Left PICA not well seen. Basilar patent to its distal aspect without stenosis. Superior cerebral arteries patent bilaterally. Both PCAs primarily supplied via the basilar well perfused to their distal aspects. Anatomic variants: None significant.  No intracranial aneurysm. IMPRESSION: MRI HEAD IMPRESSION: 1. Small acute ischemic nonhemorrhagic infarct involving the superior right cerebellum. 2. Otherwise normal brain MRI for age. MRA HEAD  IMPRESSION: 1. Motion degraded exam. 2. Negative intracranial MRA. No large vessel occlusion, hemodynamically significant stenosis, or other acute vascular abnormality. Electronically Signed   By: Jeannine Boga M.D.   On: 10/18/2021 19:21   MR BRAIN WO CONTRAST  Result Date: 10/18/2021 CLINICAL DATA:  Initial evaluation for acute dizziness. EXAM: MRI HEAD WITHOUT CONTRAST MRA HEAD WITHOUT CONTRAST TECHNIQUE: Multiplanar, multi-echo pulse sequences of the brain and surrounding structures were acquired without intravenous contrast. Angiographic images of the Circle of Willis were acquired using MRA  technique without intravenous contrast. COMPARISON:  Prior head CT from earlier the same day. FINDINGS: MRI HEAD FINDINGS Brain: Cerebral volume within normal limits. Few scattered subcentimeter foci of T2/FLAIR hyperintensity noted involving the supratentorial cerebral white matter, nonspecific, but overall minimal in nature, and of doubtful significance. Small focus of restricted diffusion seen involving the superior right cerebellum and cerebellar vermis, consistent with an acute ischemic infarct (series 5, images 11-7). No associated hemorrhage or mass effect. No other evidence for acute or subacute ischemia. Gray-white matter differentiation otherwise maintained. No other areas of remote cortical infarction. No acute or chronic intracranial blood products. No mass lesion or midline shift. No hydrocephalus or extra-axial fluid collection. Pituitary gland suprasellar region within normal limits. Midline structures intact. Vascular: Major intracranial vascular flow voids are maintained. Skull and upper cervical spine: Craniocervical junction within normal limits. Bone marrow signal intensity normal. No scalp soft tissue abnormality. Sinuses/Orbits: Globes orbital soft tissues within normal limits. Scattered mucosal thickening noted about the ethmoidal air cells and maxillary sinuses. Paranasal sinuses are otherwise clear. No mastoid effusion. Other: None. MRA HEAD FINDINGS Anterior circulation: Examination moderately degraded by motion artifact. Visualized distal cervical segments of the internal carotid arteries are patent with antegrade flow. Petrous, cavernous, and supraclinoid segments patent without hemodynamically significant stenosis or other abnormality. A1 segments patent bilaterally. Normal anterior communicating artery complex. Both ACAs patent to their distal aspects without stenosis. M1 segments widely patent bilaterally. Left M1 is fenestrated distally. Normal MCA bifurcations. Distal MCA branches  perfused and symmetric. Posterior circulation: Visualized V4 segments widely patent. Right PICA patent at its origin. Left PICA not well seen. Basilar patent to its distal aspect without stenosis. Superior cerebral arteries patent bilaterally. Both PCAs primarily supplied via the basilar well perfused to their distal aspects. Anatomic variants: None significant.  No intracranial aneurysm. IMPRESSION: MRI HEAD IMPRESSION: 1. Small acute ischemic nonhemorrhagic infarct involving the superior right cerebellum. 2. Otherwise normal brain MRI for age. MRA HEAD IMPRESSION: 1. Motion degraded exam. 2. Negative intracranial MRA. No large vessel occlusion, hemodynamically significant stenosis, or other acute vascular abnormality. Electronically Signed   By: Jeannine Boga M.D.   On: 10/18/2021 19:21   CT ABDOMEN PELVIS W CONTRAST  Result Date: 10/18/2021 CLINICAL DATA:  Left lower quadrant abdominal pain EXAM: CT ABDOMEN AND PELVIS WITH CONTRAST TECHNIQUE: Multidetector CT imaging of the abdomen and pelvis was performed using the standard protocol following bolus administration of intravenous contrast. RADIATION DOSE REDUCTION: This exam was performed according to the departmental dose-optimization program which includes automated exposure control, adjustment of the mA and/or kV according to patient size and/or use of iterative reconstruction technique. CONTRAST:  183m OMNIPAQUE IOHEXOL 300 MG/ML  SOLN COMPARISON:  10/02/2007 FINDINGS: Lower chest: No acute abnormality. Hepatobiliary: No focal liver abnormality is seen. No gallstones, gallbladder wall thickening, or biliary dilatation. Pancreas: Unremarkable. No pancreatic ductal dilatation or surrounding inflammatory changes. Spleen: Normal in size without focal abnormality. Adrenals/Urinary Tract: Unremarkable adrenal glands. Kidneys enhance  symmetrically without focal lesion, stone, or hydronephrosis. Ureters are nondilated. Urinary bladder appears unremarkable.  Stomach/Bowel: Stomach and small bowel within normal limits. No dilated loops of bowel. Pancolonic mild circumferential bowel wall thickening with relative sparing of the rectum. Normal appendix in the right lower quadrant. Vascular/Lymphatic: Scattered aortoiliac atherosclerotic calcifications without aneurysm. No abdominopelvic lymphadenopathy. Reproductive: Uterus and bilateral adnexa are unremarkable. Other: No free fluid. No abdominopelvic fluid collection. No pneumoperitoneum. No abdominal wall hernia. Musculoskeletal: No acute or significant osseous findings. IMPRESSION: Pancolonic mild circumferential bowel wall thickening with relative sparing of the rectum, suggestive of an infectious or inflammatory colitis. Aortic Atherosclerosis (ICD10-I70.0). Electronically Signed   By: Davina Poke D.O.   On: 10/18/2021 11:13   ECHOCARDIOGRAM COMPLETE  Result Date: 10/19/2021    ECHOCARDIOGRAM REPORT   Patient Name:   GIANNAH ZAVADIL Date of Exam: 10/19/2021 Medical Rec #:  [id number]      Height:       64.0 in Accession #:    [id number]     Weight:       140.0 lb Date of Birth:  1975/09/05     BSA:          1.681 m Patient Age:    63 years       BP:           110/72 mmHg Patient Gender: F              HR:           98 bpm. Exam Location:  [facility] Procedure: 2D Echo, Cardiac Doppler and Color Doppler Indications:    Stroke  History:        Patient has no prior history of Echocardiogram examinations.                 Stroke; Risk Factors:Current Smoker.  Sonographer:    Wenda Low Referring Phys: [place]  1. Left ventricular ejection fraction, by estimation, is 55 to 60%. The left ventricle has normal function. The left ventricle has no regional wall motion abnormalities. Left ventricular diastolic parameters were normal.  2. Right ventricular systolic function is normal. The right ventricular size is normal. Tricuspid regurgitation signal is inadequate for assessing PA  pressure.  3. The mitral valve is grossly normal. Mild mitral valve regurgitation.  4. The aortic valve is tricuspid. Aortic valve regurgitation is not visualized. No aortic stenosis is present. Aortic valve mean gradient measures 3.0 mmHg.  5. The inferior vena cava is normal in size with greater than 50% respiratory variability, suggesting right atrial pressure of 3 mmHg. Comparison(s): No prior Echocardiogram. FINDINGS  Left Ventricle: Left ventricular ejection fraction, by estimation, is 55 to 60%. The left ventricle has normal function. The left ventricle has no regional wall motion abnormalities. The left ventricular internal cavity size was normal in size. There is  borderline left ventricular hypertrophy. Left ventricular diastolic parameters were normal. Right Ventricle: The right ventricular size is normal. No increase in right ventricular wall thickness. Right ventricular systolic function is normal. Tricuspid regurgitation signal is inadequate for assessing PA pressure. Left Atrium: Left atrial size was normal in size. Right Atrium: Right atrial size was normal in size. Pericardium: There is no evidence of pericardial effusion. Mitral Valve: The mitral valve is grossly normal. Mild mitral valve regurgitation. MV peak gradient, 4.5 mmHg. The mean mitral valve gradient is 2.0 mmHg. Tricuspid Valve: The tricuspid valve is grossly normal. Tricuspid valve regurgitation is trivial. Aortic Valve: The  aortic valve is tricuspid. Aortic valve regurgitation is not visualized. No aortic stenosis is present. Aortic valve mean gradient measures 3.0 mmHg. Aortic valve peak gradient measures 6.8 mmHg. Aortic valve area, by VTI measures 2.06 cm. Pulmonic Valve: The pulmonic valve was grossly normal. Pulmonic valve regurgitation is trivial. Aorta: The aortic root is normal in size and structure. Venous: The inferior vena cava is normal in size with greater than 50% respiratory variability, suggesting right atrial  pressure of 3 mmHg. IAS/Shunts: No atrial level shunt detected by color flow Doppler.  LEFT VENTRICLE PLAX 2D LVIDd:         3.70 cm     Diastology LVIDs:         2.40 cm     LV e' medial:    13.40 cm/s LV PW:         1.10 cm     LV E/e' medial:  6.7 LV IVS:        0.80 cm     LV e' lateral:   12.00 cm/s LVOT diam:     2.00 cm     LV E/e' lateral: 7.5 LV SV:         55 LV SV Index:   33 LVOT Area:     3.14 cm  LV Volumes (MOD) LV vol d, MOD A2C: 40.8 ml LV vol d, MOD A4C: 37.0 ml LV vol s, MOD A2C: 16.7 ml LV vol s, MOD A4C: 17.8 ml LV SV MOD A2C:     24.1 ml LV SV MOD A4C:     37.0 ml LV SV MOD BP:      21.5 ml RIGHT VENTRICLE RV Basal diam:  2.35 cm RV Mid diam:    2.50 cm RV S prime:     17.50 cm/s TAPSE (M-mode): 2.9 cm LEFT ATRIUM             Index        RIGHT ATRIUM          Index LA diam:        2.50 cm 1.49 cm/m   RA Area:     9.12 cm LA Vol (A2C):   33.4 ml 19.87 ml/m  RA Volume:   16.60 ml 9.87 ml/m LA Vol (A4C):   32.8 ml 19.51 ml/m LA Biplane Vol: 34.5 ml 20.52 ml/m  AORTIC VALVE                    PULMONIC VALVE AV Area (Vmax):    2.18 cm     PV Vmax:       0.81 m/s AV Area (Vmean):   2.09 cm     PV Peak grad:  2.7 mmHg AV Area (VTI):     2.06 cm AV Vmax:           130.00 cm/s AV Vmean:          82.700 cm/s AV VTI:            0.268 m AV Peak Grad:      6.8 mmHg AV Mean Grad:      3.0 mmHg LVOT Vmax:         90.10 cm/s LVOT Vmean:        55.100 cm/s LVOT VTI:          0.176 m LVOT/AV VTI ratio: 0.66  AORTA Ao Root diam: 3.00 cm MITRAL VALVE MV Area (PHT): 5.46 cm    SHUNTS MV Area VTI:  2.56 cm    Systemic VTI:  0.18 m MV Peak grad:  4.5 mmHg    Systemic Diam: 2.00 cm MV Mean grad:  2.0 mmHg MV Vmax:       1.06 m/s MV Vmean:      67.5 cm/s MV Decel Time: 139 msec MV E velocity: 89.50 cm/s MV A velocity: 98.50 cm/s MV E/A ratio:  0.91 Rozann Lesches MD Electronically signed by Rozann Lesches MD Signature Date/Time: 10/19/2021/1:14:17 PM    Final      Scheduled Meds:  amitriptyline  25  mg Oral QHS   aspirin  300 mg Rectal Daily   atorvastatin  40 mg Oral Daily   heparin  5,000 Units Subcutaneous Q8H   potassium chloride  40 mEq Oral Q3H   scopolamine  1 patch Transdermal Q72H   sodium chloride flush  3 mL Intravenous Q12H   sodium chloride flush  3 mL Intravenous Q12H   Continuous Infusions:  sodium chloride     0.9 % NaCl with KCl 20 mEq / L 125 mL/hr at 10/19/21 0607   cefTRIAXone (ROCEPHIN)  IV 2 g (10/18/21 1644)   metronidazole 500 mg (10/19/21 1026)     LOS: 0 days    Roxan Hockey M.D on 10/19/2021 at 2:20 PM  Go to [email or web address] - for contact info  [facility] - Office  [phone]  If 7PM-7AM, please contact night-coverage [email or web address] Password [email or web address] 10/19/2021, 2:20 PM

## 2021-10-19 NOTE — Plan of Care (Signed)
?  Problem: Acute Rehab PT Goals(only PT should resolve) ?Goal: Pt Will Go Supine/Side To Sit ?Outcome: Progressing ?Flowsheets (Taken 10/19/2021 1215) ?Pt will go Supine/Side to Sit: ? with supervision ? with min guard assist ?Goal: Patient Will Transfer Sit To/From Stand ?Outcome: Progressing ?Flowsheets (Taken 10/19/2021 1215) ?Patient will transfer sit to/from stand: ? with supervision ? with min guard assist ?Goal: Pt Will Transfer Bed To Chair/Chair To Bed ?Outcome: Progressing ?Flowsheets (Taken 10/19/2021 1215) ?Pt will Transfer Bed to Chair/Chair to Bed: ? min guard assist ? with supervision ?Goal: Pt Will Ambulate ?Outcome: Progressing ?Flowsheets (Taken 10/19/2021 1215) ?Pt will Ambulate: ? 50 feet ? with minimal assist ? with rolling walker ? with least restrictive assistive device ?  ?12:16 PM, 10/19/21 ?Lonell Grandchild, MPT ?Physical Therapist with [place] ?[facility] ?[phone] office ?[phone] mobile phone ? ?

## 2021-10-19 NOTE — Evaluation (Addendum)
Occupational Therapy Evaluation Patient Details Name: Nancy Cochran MRN: [id number] DOB: 1976-04-12 Today's Date: 10/19/2021   History of Present Illness Nancy Cochran  is a 46 y.o. female patient with history of tobacco abuse, anxiety disorder and insomnia presents with persistent vertigo associated with nausea and vomiting of less than 24-hour duration  -1 episode of loose stools--stools are without blood or mucus  -Emesis is without blood or bile  -Patient is dry heaving  -Becomes very vertiginous and nauseous with positional change  -Unable to keep her eyes open for long.  Due to severe vertigo  -Additional history obtained from patient's daughter at bedside  -EDP attempted to give patient oral intake with patient had emesis   Clinical Impression   Pt agreeable to OT and pt co-evaluation. Pt reports independence for ADL's and IADL's at baseline. Pt has 24/7 assist at home with her son being able to physically assist if needed. Pt presents with inability to open eyes for more than a few seconds at a time due to significant dizziness. Pt limited by this as well as nausea and vomiting. Moderate assist needed for bed mobility with poor sitting balance at EOB. Pt able to transfer to chair and back but with labored movement and increase in nausea. Pt IV came out during session. Nursing was notified and attending to the issues. Pt will benefit from continued OT in the hospital and recommended venue below to increase strength, balance, and endurance for safe ADL's.        Recommendations for follow up therapy are one component of a multi-disciplinary discharge planning process, led by the attending physician.  Recommendations may be updated based on patient status, additional functional criteria and insurance authorization.   Follow Up Recommendations  Acute inpatient rehab (3hours/day)    Assistance Recommended at Discharge Frequent or constant Supervision/Assistance  Patient can return home with  the following A lot of help with walking and/or transfers;A lot of help with bathing/dressing/bathroom;Assistance with cooking/housework;Assistance with feeding;Assist for transportation;Help with stairs or ramp for entrance    Functional Status Assessment  Patient has had a recent decline in their functional status and demonstrates the ability to make significant improvements in function in a reasonable and predictable amount of time.  Equipment Recommendations  None recommended by OT    Recommendations for Other Services       Precautions / Restrictions Precautions Precautions: Fall Restrictions Weight Bearing Restrictions: No       10/19/21 0900  Pain Assessment  Pain Assessment 0-10  Pain Score 2  Pain Location ear  Pain Descriptors / Indicators Other (Comment) (running)  Pain Intervention(s) Limited activity within patient's tolerance;Monitored during session;Repositioned   Mobility Bed Mobility Overal bed mobility: Needs Assistance Bed Mobility: Supine to Sit, Sit to Supine     Supine to sit: Mod assist Sit to supine: Mod assist   General bed mobility comments: Slow labored movement; eyes closed throuhgout.    Transfers Overall transfer level: Needs assistance Equipment used: Rolling walker (2 wheels) Transfers: Sit to/from Stand, Bed to chair/wheelchair/BSC Sit to Stand: Mod assist     Step pivot transfers: Mod assist     General transfer comment: Slow labored movement. Dizzy and nauseous.      Balance Overall balance assessment: Needs assistance Sitting-balance support: Feet supported, Bilateral upper extremity supported Sitting balance-Leahy Scale: Poor Sitting balance - Comments: poor to fair seated EOB Postural control:  (anterior lean) Standing balance support: Bilateral upper extremity supported, During functional activity, Reliant on assistive  device for balance Standing balance-Leahy Scale: Poor Standing balance comment: using RW                            ADL either performed or assessed with clinical judgement   ADL Overall ADL's : Needs assistance/impaired Eating/Feeding: Minimal assistance;Sitting   Grooming: Sitting;Minimal assistance;Applying deodorant;Wash/dry face;Wash/dry hands   Upper Body Bathing: Sitting;Minimal assistance   Lower Body Bathing: Maximal assistance;Total assistance;Bed level   Upper Body Dressing : Minimal assistance;Sitting   Lower Body Dressing: Maximal assistance;Total assistance;Bed level Lower Body Dressing Details (indicate cue type and reason): Total assist to don socks seated at [employer] Transfer: Moderate assistance;Rolling walker (2 wheels);Stand-pivot Armed forces technical officer Details (indicate cue type and reason): simulated via EOB to chair transfer and back with moderate assist using RW. Toileting- Clothing Manipulation and Hygiene: Total assistance;Bed level   Tub/ Shower Transfer: Moderate assistance;Maximal assistance;Tub bench;Rolling walker (2 wheels)   Functional mobility during ADLs: Moderate assistance;Rolling walker (2 wheels) General ADL Comments: Pt limited in performance by nausea and dizziness. Limited R UE strength and coordination.     Vision Baseline Vision/History: 1 Wears glasses (Has glasses but does not regularly wear them.) Ability to See in Adequate Light: 1 Impaired Patient Visual Report: Other (comment) (Pt is unable to open eyes for more than a few seconds due to dizziness.) Vision Assessment?: Vision impaired- to be further tested in functional context Additional Comments: Pt is unable to open eyes for more than a few seconds due to dizziness.         Hand Dominance Right   Extremity/Trunk Assessment Upper Extremity Assessment Upper Extremity Assessment: RUE deficits/detail;LUE deficits/detail RUE Deficits / Details: 2+/5 shoulder flexion. 3/5 elbow flexion, 3+/5 grip. Slow labored sequential finger touching. WFL P/ROM for shoulder flexion  and abduction. RUE Sensation: WNL RUE Coordination: decreased fine motor;decreased gross motor LUE Deficits / Details: WFL A/ROM per observation. No MMT compelted. LUE Sensation: WNL LUE Coordination: WNL   Lower Extremity Assessment Lower Extremity Assessment: Defer to PT evaluation   Cervical / Trunk Assessment Cervical / Trunk Assessment: Normal   Communication Communication Communication: No difficulties   Cognition Arousal/Alertness: Awake/alert Behavior During Therapy: WFL for tasks assessed/performed Overall Cognitive Status: Within Functional Limits for tasks assessed                                                        Home Living Family/patient expects to be discharged to:: Private residence Living Arrangements: Spouse/significant other;Children Available Help at Discharge: Family;Available 24 hours/day Type of Home: House Home Access: Ramped entrance     Home Layout: One level     Bathroom Shower/Tub: Occupational psychologist: Handicapped height (toilet riser) Bathroom Accessibility: Yes How Accessible: Accessible via walker Home Equipment: Rives (2 wheels);Rollator (4 wheels);Cane - quad;Shower seat;BSC/3in1;Wheelchair - manual          Prior Functioning/Environment Prior Level of Function : Independent/Modified Independent             Mobility Comments: Independent household and community ambulation. ADLs Comments: Indepdnent ADL's and IADL's.        OT Problem List: Decreased strength;Decreased range of motion;Decreased activity tolerance;Impaired balance (sitting and/or standing);Impaired vision/perception;Decreased coordination;Impaired tone;Impaired UE functional use      OT Treatment/Interventions: Self-care/ADL  training;Therapeutic exercise;Neuromuscular education;DME and/or AE instruction;Manual therapy;Therapeutic activities;Visual/perceptual remediation/compensation;Patient/family  education;Balance training    OT Goals(Current goals can be found in the care plan section) Acute Rehab OT Goals Patient Stated Goal: return home OT Goal Formulation: With patient Time For Goal Achievement: 11/02/21 Potential to Achieve Goals: Good  OT Frequency: Min 2X/week    Co-evaluation PT/OT/SLP Co-Evaluation/Treatment: Yes Reason for Co-Treatment: Complexity of the patient's impairments (multi-system involvement)   OT goals addressed during session: ADL's and self-care      AM-PAC OT "6 Clicks" Daily Activity     Outcome Measure Help from another person eating meals?: A Little Help from another person taking care of personal grooming?: A Little Help from another person toileting, which includes using toliet, bedpan, or urinal?: Total Help from another person bathing (including washing, rinsing, drying)?: A Lot Help from another person to put on and taking off regular upper body clothing?: A Little Help from another person to put on and taking off regular lower body clothing?: Total 6 Click Score: 13   End of Session Equipment Utilized During Treatment: Rolling walker (2 wheels) Nurse Communication: Other (comment) (Notified pt IV came out.)  Activity Tolerance: Other (comment) (Limited by nausea and dizziness) Patient left: in bed;with call bell/phone within reach;with family/visitor present  OT Visit Diagnosis: Unsteadiness on feet (R26.81);Other abnormalities of gait and mobility (R26.89);Muscle weakness (generalized) (M62.81);Low vision, both eyes (H54.2);Other symptoms and signs involving the nervous system (R29.898);Dizziness and giddiness (R42);Hemiplegia and hemiparesis Hemiplegia - Right/Left: Right Hemiplegia - dominant/non-dominant: Dominant Hemiplegia - caused by: Cerebral infarction                Time: 3329-5188 OT Time Calculation (min): 26 min Charges:  OT General Charges $OT Visit: 1 Visit OT Evaluation $OT Eval Moderate Complexity: 1 Mod  Seniya Stoffers OT, MOT  [employer] 10/19/2021, 10:04 AM

## 2021-10-19 NOTE — Progress Notes (Signed)
Patient continues to complain of being dizzy and having a hard time keeping her eye open.  ?She is aware that she has very little control of her right arm. ?

## 2021-10-19 NOTE — Plan of Care (Signed)
?  Problem: Acute Rehab OT Goals (only OT should resolve) ?Goal: Pt. Will Perform Grooming ?Flowsheets (Taken 10/19/2021 1006) ?Pt Will Perform Grooming: ? standing ? with min guard assist ?Goal: Pt. Will Perform Upper Body Bathing ?Flowsheets (Taken 10/19/2021 1006) ?Pt Will Perform Upper Body Bathing: ? with modified independence ? sitting ?Goal: Pt. Will Perform Lower Body Bathing ?Flowsheets (Taken 10/19/2021 1006) ?Pt Will Perform Lower Body Bathing: ? with min guard assist ? sitting/lateral leans ?Goal: Pt. Will Perform Upper Body Dressing ?Flowsheets (Taken 10/19/2021 1006) ?Pt Will Perform Upper Body Dressing: ? with modified independence ? sitting ?Goal: Pt. Will Perform Lower Body Dressing ?Flowsheets (Taken 10/19/2021 1006) ?Pt Will Perform Lower Body Dressing: ? with min assist ? with min guard assist ? sitting/lateral leans ? with adaptive equipment ?Goal: Pt. Will Transfer To Toilet ?Flowsheets (Taken 10/19/2021 1006) ?Pt Will Transfer to Toilet: ? with supervision ? stand pivot transfer ?Goal: Pt. Will Perform Toileting-Clothing Manipulation ?Flowsheets (Taken 10/19/2021 1006) ?Pt Will Perform Toileting - Clothing Manipulation and hygiene: ? with min assist ? with min guard assist ? sitting/lateral leans ?Goal: Pt/Caregiver Will Perform Home Exercise Program ?Flowsheets (Taken 10/19/2021 1006) ?Pt/caregiver will Perform Home Exercise Program: ? Increased ROM ? Increased strength ? Right Upper extremity ? With minimal assist ? Quadarius Henton OT, MOT ? ?

## 2021-10-19 NOTE — Evaluation (Signed)
Physical Therapy Evaluation Patient Details Name: Nancy Cochran MRN: [id number] DOB: 02/11/1976 Today's Date: 10/19/2021  History of Present Illness  Nancy Cochran  is a 46 y.o. female patient with history of tobacco abuse, anxiety disorder and insomnia presents with persistent vertigo associated with nausea and vomiting of less than 24-hour duration  -1 episode of loose stools--stools are without blood or mucus  -Emesis is without blood or bile  -Patient is dry heaving  -Becomes very vertiginous and nauseous with positional change  -Unable to keep her eyes open for long.  Due to severe vertigo  -Additional history obtained from patient's daughter at bedside  -EDP attempted to give patient oral intake with patient had emesis   Clinical Impression  Patient has difficulty keep eyes open due to dizziness and limited to bedside activities mostly due to c/o severe nausea with vomiting.  Patient demonstrates slow labored movement for sitting up at bedside with poor tolerance for lying on left side due to increase in symptoms of dizziness and nausea, very unsteady on feet requiring Mod hand held assist to prevent loss of balance, had to use a RW for safety and tolerated sitting up in chair briefly before requesting to lie down due to dizziness/nausea.  Patient put back to bed to finish assessment of BLE, c/o decreased sensation LLE, had patient roll to left side to prepare for echocardiogram, then patient had episode of vomiting and IV RUE was incidentally dislodged - nurse notified.  Patient will benefit from continued skilled physical therapy in hospital and recommended venue below to increase strength, balance, endurance for safe ADLs and gait.         Recommendations for follow up therapy are one component of a multi-disciplinary discharge planning process, led by the attending physician.  Recommendations may be updated based on patient status, additional functional criteria and insurance  authorization.  Follow Up Recommendations Acute inpatient rehab (3hours/day)    Assistance Recommended at Discharge Intermittent Supervision/Assistance  Patient can return home with the following  A lot of help with walking and/or transfers;A lot of help with bathing/dressing/bathroom;Help with stairs or ramp for entrance;Assistance with cooking/housework    Equipment Recommendations None recommended by PT  Recommendations for Other Services       Functional Status Assessment Patient has had a recent decline in their functional status and demonstrates the ability to make significant improvements in function in a reasonable and predictable amount of time.     Precautions / Restrictions Precautions Precautions: Fall Restrictions Weight Bearing Restrictions: No      Mobility  Bed Mobility Overal bed mobility: Needs Assistance Bed Mobility: Supine to Sit, Sit to Supine     Supine to sit: Mod assist Sit to supine: Mod assist   General bed mobility comments: poor tolerance for lying on left side due to increased nausea/dizziness    Transfers Overall transfer level: Needs assistance Equipment used: Rolling walker (2 wheels), 1 person hand held assist Transfers: Sit to/from Stand, Bed to chair/wheelchair/BSC Sit to Stand: Mod assist   Step pivot transfers: Mod assist       General transfer comment: Slow labored movement. Dizzy and nauseous.    Ambulation/Gait Ambulation/Gait assistance: Mod assist Gait Distance (Feet): 5 Feet Assistive device: Rolling walker (2 wheels), None Gait Pattern/deviations: Decreased step length - right, Decreased step length - left, Decreased stride length, Ataxic Gait velocity: decreased     General Gait Details: limited to a few slow labored ataxic like side steps at bedside before having  to sit due to c/o severe dizziness and nausea  Stairs            Wheelchair Mobility    Modified Rankin (Stroke Patients Only)        Balance Overall balance assessment: Needs assistance Sitting-balance support: Feet supported, No upper extremity supported Sitting balance-Leahy Scale: Poor Sitting balance - Comments: fair/poor seated at EOB   Standing balance support: During functional activity, No upper extremity supported Standing balance-Leahy Scale: Poor Standing balance comment: fair/poor using RW                             Pertinent Vitals/Pain Pain Assessment Pain Assessment: 0-10 Pain Score: 2  Pain Location: ear Pain Descriptors / Indicators: Discomfort Pain Intervention(s): Limited activity within patient's tolerance, Monitored during session    Home Living Family/patient expects to be discharged to:: Private residence Living Arrangements: Spouse/significant other Available Help at Discharge: Family;Available 24 hours/day Type of Home: House Home Access: Ramped entrance       Home Layout: One level Home Equipment: Agricultural consultant (2 wheels);Rollator (4 wheels);Cane - quad;Shower seat;BSC/3in1;Wheelchair - manual      Prior Function Prior Level of Function : Independent/Modified Independent             Mobility Comments: Independent household and community ambulation. ADLs Comments: Indepdnent ADL's and IADL's.     Hand Dominance   Dominant Hand: Right    Extremity/Trunk Assessment   Upper Extremity Assessment Upper Extremity Assessment: Defer to OT evaluation RUE Deficits / Details: 2+/5 shoulder flexion. 3/5 elbow flexion, 3+/5 grip. Slow labored sequential finger touching. WFL P/ROM for shoulder flexion and abduction. RUE Sensation: WNL RUE Coordination: decreased fine motor;decreased gross motor LUE Deficits / Details: WFL A/ROM per observation. No MMT compelted. LUE Sensation: WNL LUE Coordination: WNL    Lower Extremity Assessment Lower Extremity Assessment: Generalized weakness;RLE deficits/detail;LLE deficits/detail RLE Deficits / Details: grossly 4/5 RLE  Sensation: WNL RLE Coordination: WNL LLE Deficits / Details: grossly -4/5 LLE Sensation: decreased proprioception LLE Coordination: decreased gross motor    Cervical / Trunk Assessment Cervical / Trunk Assessment: Normal  Communication   Communication: No difficulties  Cognition Arousal/Alertness: Awake/alert Behavior During Therapy: WFL for tasks assessed/performed Overall Cognitive Status: Within Functional Limits for tasks assessed                                          General Comments      Exercises     Assessment/Plan    PT Assessment Patient needs continued PT services  PT Problem List Decreased strength;Decreased activity tolerance;Decreased balance;Decreased mobility;Decreased coordination       PT Treatment Interventions DME instruction;Gait training;Stair training;Functional mobility training;Therapeutic activities;Therapeutic exercise;Patient/family education;Balance training;Neuromuscular re-education    PT Goals (Current goals can be found in the Care Plan section)  Acute Rehab PT Goals Patient Stated Goal: return home after rehab PT Goal Formulation: With patient/family Time For Goal Achievement: 11/02/21 Potential to Achieve Goals: Good    Frequency Min 4X/week     Co-evaluation PT/OT/SLP Co-Evaluation/Treatment: Yes Reason for Co-Treatment: Complexity of the patient's impairments (multi-system involvement);To address functional/ADL transfers   OT goals addressed during session: ADL's and self-care       AM-PAC PT "6 Clicks" Mobility  Outcome Measure Help needed turning from your back to your side while in a flat bed without  using bedrails?: A Little Help needed moving from lying on your back to sitting on the side of a flat bed without using bedrails?: A Lot Help needed moving to and from a bed to a chair (including a wheelchair)?: A Lot Help needed standing up from a chair using your arms (e.g., wheelchair or bedside  chair)?: A Lot Help needed to walk in hospital room?: A Lot Help needed climbing 3-5 steps with a railing? : Total 6 Click Score: 12    End of Session   Activity Tolerance: Patient tolerated treatment well;Patient limited by fatigue;Other (comment) (Patient limited by nausea/dizziness) Patient left: in bed;with call bell/phone within reach;with family/visitor present Nurse Communication: Mobility status PT Visit Diagnosis: Unsteadiness on feet (R26.81);Other abnormalities of gait and mobility (R26.89);Muscle weakness (generalized) (M62.81)    Time: 5784-6962 PT Time Calculation (min) (ACUTE ONLY): 31 min   Charges:   PT Evaluation $PT Eval Moderate Complexity: 1 Mod PT Treatments $Therapeutic Activity: 23-37 mins        12:14 PM, 10/19/21 Ocie Bob, MPT Physical Therapist with [facility] [phone] office [phone] mobile phone

## 2021-10-19 NOTE — Progress Notes (Signed)
Inpatient Rehab Admissions Coordinator Note:  ? ?Per therapy recommendations patient was screened for CIR candidacy by Michel Santee, PT. At this time, pt appears to be a potential candidate for CIR. An order for rehab consult for full assessment has already been placed.  Please contact me any with questions. ? ?Shann Medal, PT, DPT ?[phone] ?10/19/21 ?12:41 PM  ?

## 2021-10-19 NOTE — Consult Note (Addendum)
I connected with  Nancy Cochran on 10/19/21 by a video enabled telemedicine application and verified that I am speaking with the correct person using two identifiers. ?  ?I discussed the limitations of evaluation and management by telemedicine. The patient expressed understanding and agreed to proceed. ? ?Location of patient: [facility] ?Location of physician: [facility] ? ?Neurology Consultation ?Reason for Consult: Acute ischemic stroke ?Referring Physician: Dr. Roxan Hockey ? ?CC: Vertigo ? ?History is obtained from: Patient, chart review ? ?HPI: Nancy Cochran is a 46 y.o. female with past medical history of nicotine use disorder, anxiety, insomnia who presented with vertigo, nausea and vomiting on 10/18/2021.  Patient states she went to bed on 10/17/2021 around 9 PM and was completely normal.  Next morning she woke up, is unsure of the time, states it was bright outside and kids were still in bed.  She got up to use the bathroom but had to go on all fours because she felt nauseous.  She then presented to the ED and was admitted for further work-up.  MRI brain was obtained which showed an acute stroke and therefore neurology was consulted. ? ?Patient denies recent neck manipulation, lifting heavy weights.  Does take Depo-Provera shots and smokes about half a pack per day.  She denies any family history of blood clotting disorders, lupus. ? ?Last known normal 10/17/2021 2100 ?Event happened at home ?Blood pressure on arrival 125/84, blood glucose 184 ?tPA not administered at outside window ?No thrombectomy as no large vessel occlusion ?NIHSS 2 ( limb ataxia and dysarthria) ?mRS 0 ? ? ?ROS: All other systems reviewed and negative except as noted in the HPI.  ? ?Past Medical History:  ?Diagnosis Date  ? DDD (degenerative disc disease), cervical 2016  ? Dr. Karie Schwalbe  ? Gestational diabetes   ? ? ?Family History  ?Problem Relation Age of Onset  ? Hypertension Maternal Grandmother   ? Heart attack  Maternal Grandmother   ? Hypertension Maternal Grandfather   ? Heart attack Maternal Grandfather   ? Hypertension Paternal Grandmother   ? Heart attack Paternal Grandmother   ? Stroke Paternal Grandmother   ? Hypertension Paternal Grandfather   ? Heart attack Paternal Grandfather   ? Diabetes Maternal Uncle   ? Stroke Maternal Aunt   ? Breast cancer Neg Hx   ?     Mothers family  ? Cancer Neg Hx   ?     Fathers family  ? ? ?Social History:  reports that she has been smoking cigarettes. She has a 28.00 pack-year smoking history. She has never used smokeless tobacco. She reports that she does not drink alcohol and does not use drugs. ? ?Medications Prior to Admission  ?Medication Sig Dispense Refill Last Dose  ? amitriptyline (ELAVIL) 25 MG tablet Take 1 tab PO qhs 90 tablet 2 10/17/2021  ? ibuprofen (ADVIL) 400 MG tablet Take by mouth.   Past Week  ? medroxyPROGESTERone (DEPO-PROVERA) 150 MG/ML injection INJECT 1ML EVERY 11-12 WEEKS   Past Month  ? ondansetron (ZOFRAN ODT) 4 MG disintegrating tablet Take 1 tablet (4 mg total) by mouth every 8 (eight) hours as needed for nausea or vomiting. 10 tablet 1 Past Month  ? zolpidem (AMBIEN) 10 MG tablet TAKE 1/2-1 TABLET BY MOUTH EVERY DAY AT BEDTIME AS NEEDED FOR SLEEP 90 tablet 1 Past Week  ? albuterol (VENTOLIN HFA) 108 (90 Base) MCG/ACT inhaler Inhale 2 puffs into the lungs every 6 (six) hours as needed  for wheezing or shortness of breath. (Patient not taking: Reported on 10/18/2021) 8 g 3 Not Taking  ? hydrOXYzine (ATARAX/VISTARIL) 25 MG tablet Take 1 tablet (25 mg total) by mouth 3 (three) times daily as needed for anxiety. (Patient not taking: Reported on 10/18/2021) 30 tablet 2 Not Taking  ? multivitamin-iron-minerals-folic acid (CENTRUM) chewable tablet Chew 1 tablet by mouth daily. (Patient not taking: Reported on 10/18/2021)   Not Taking  ?  ? ? ?Exam: ?Current vital signs: ?BP 110/72 (BP Location: Left Arm)   Pulse (!) 102   Temp 98.8 ?F (37.1 ?C) (Oral)   Resp 20    Ht 5' 4"  (1.626 m)   Wt 63.5 kg   SpO2 97%   BMI 24.03 kg/m?  ?Vital signs in last 24 hours: ?Temp:  [98.5 ?F (36.9 ?C)-98.8 ?F (37.1 ?C)] 98.8 ?F (37.1 ?C) (03/10 0300) ?Pulse Rate:  [87-110] 102 (03/10 0300) ?Resp:  [16-20] 20 (03/10 0300) ?BP: (107-140)/(61-95) 110/72 (03/10 0300) ?SpO2:  [96 %-100 %] 97 % (03/10 0300) ? ? ?Physical Exam  ?Constitutional: Appears well-developed and well-nourished.  ?Psych: Affect appropriate to situation ?Eyes: No scleral injection ?Respiratory: Effort normal, non-labored breathing ?Neuro: AOx3, cranial nerves II to XII appear grossly intact, able to name objects, able to follow commands, + dysarthria (could be due to recent Ativan use), antigravity strength in all 4 extremities without drift, right upper extremity mild finger-to-nose ataxia, denies any sensory change ? ? ?I have reviewed labs in epic and the results pertinent to this consultation are: ?CBC:  ?Recent Labs  ?Lab 10/18/21 ?0805 10/19/21 ?7412  ?WBC 16.1* 17.2*  ?HGB 13.4 11.8*  ?HCT 42.1 36.1  ?MCV 94.0 93.5  ?PLT 368 329  ? ? ?Basic Metabolic Panel:  ?Lab Results  ?Component Value Date  ? NA 140 10/19/2021  ? K 3.4 (L) 10/19/2021  ? CO2 22 10/19/2021  ? GLUCOSE 87 10/19/2021  ? BUN 12 10/19/2021  ? CREATININE 0.68 10/19/2021  ? CALCIUM 8.2 (L) 10/19/2021  ? GFRNONAA >60 10/19/2021  ? GFRAA 120 10/12/2020  ? ?Lipid Panel:  ?Lab Results  ?Component Value Date  ? LDLCALC 126 (H) 10/19/2021  ? ?HgbA1c:  ?Lab Results  ?Component Value Date  ? HGBA1C 5.2 10/18/2021  ? ?Urine Drug Screen: No results found for: LABOPIA, COCAINSCRNUR, [place], [place], THCU, LABBARB  ?Alcohol Level No results found for: ETH ? ? ?I have reviewed the images obtained: ?MRI brain without contrast 10/18/2021:  Small acute ischemic nonhemorrhagic infarct involving the superior right cerebellum. Otherwise normal brain MRI for age. ? ?MRA head without contrast 10/18/2020: No stenosis, large vessel occlusion ? ? ? ? ?ASSESSMENT/PLAN: ? ?Acute right  cerebellar ischemic stroke ?Hyperlipidemia ?Nicotine use disorder ?Anxiety ?Leukocytosis ?Hypokalemia  ?Hypoproteinemia ?Nausea and vomiting ?Vertigo ?-Stroke etiology: Under investigation ?-LDL 126, TSH 0.845, A1c 5.2 ? ?Recommendations: ?-CTA neck with contrast to look for vertebral artery dissection, atherosclerosis ?-TTE for cardioembolic sources ?-If CTA neck and TTE negative, recommend 30-day event monitor for possible paroxysmal A-fib and will order work-up to look for hypercoagulable states, autoimmune workup, CTA C/A/P ?-Start aspirin 81 mg daily and atorvastatin 40 mg daily for secondary stroke prevention ?-Okay to use PRN IV Zofran/IV Compazine for vomiting and meclizine for vertigo ?-We will also start as needed Valium 2 mg 3 times daily for vertigo ?- Frequent Neuro checks per stroke unit protocol ?- DVT ppx - SQH ?- Smoking cessation - counsel patient ?- Goal blood pressure: normotension as stroke happened more than 24 hours  ago. PRN labetalol if HR>60 and PRN Hydralazine if HR<60 ?- Telemetry monitoring for arrhythmia ?- Swallow screen prior to PO intake ?- Stroke education ?- PT/OT/SLP ? ?Thank you for allowing [place] to participate in the care of this patient. If you have any further questions, please contact  me or neurohospitalist.  ? ?Zeb Comfort ?Epilepsy ?[facility] ? ? ?   ?  ? ? ? ? ? ?

## 2021-10-20 LAB — SEDIMENTATION RATE: Sed Rate: 29 mm/hr — ABNORMAL HIGH (ref 0–22)

## 2021-10-20 LAB — C-REACTIVE PROTEIN: CRP: 1.5 mg/dL — ABNORMAL HIGH (ref <1.0)

## 2021-10-20 MED ORDER — CEFDINIR 300 MG PO CAPS
300.0000 mg | ORAL_CAPSULE | Freq: Once | ORAL | Status: AC
  Administered 2021-10-20: 300 mg via ORAL
  Filled 2021-10-20: qty 1

## 2021-10-20 MED ORDER — MECLIZINE HCL 12.5 MG PO TABS: 25.0000 mg | ORAL_TABLET | Freq: Three times a day (TID) | ORAL | Status: DC | PRN

## 2021-10-20 MED ORDER — ASPIRIN EC 81 MG PO TBEC
81.0000 mg | DELAYED_RELEASE_TABLET | Freq: Every day | ORAL | Status: DC
  Administered 2021-10-20: 81 mg via ORAL
  Filled 2021-10-20: qty 1

## 2021-10-20 MED ORDER — CEFDINIR 300 MG PO CAPS: 300.0000 mg | ORAL_CAPSULE | Freq: Two times a day (BID) | ORAL | 0 refills | Status: AC

## 2021-10-20 MED ORDER — METRONIDAZOLE 500 MG PO TABS
500.0000 mg | ORAL_TABLET | Freq: Once | ORAL | Status: AC
  Administered 2021-10-20: 500 mg via ORAL
  Filled 2021-10-20: qty 1

## 2021-10-20 MED ORDER — ASPIRIN 81 MG PO TBEC: 81.0000 mg | DELAYED_RELEASE_TABLET | Freq: Every day | ORAL | 11 refills | Status: AC

## 2021-10-20 MED ORDER — SODIUM CHLORIDE 0.9 % IV SOLN
2.0000 g | Freq: Once | INTRAVENOUS | Status: DC
  Filled 2021-10-20: qty 20

## 2021-10-20 MED ORDER — ACETAMINOPHEN 325 MG PO TABS: 650.0000 mg | ORAL_TABLET | Freq: Four times a day (QID) | ORAL | 0 refills | Status: AC | PRN

## 2021-10-20 MED ORDER — ALBUTEROL SULFATE HFA 108 (90 BASE) MCG/ACT IN AERS: 2.0000 | INHALATION_SPRAY | Freq: Four times a day (QID) | RESPIRATORY_TRACT | 3 refills | Status: DC | PRN

## 2021-10-20 MED ORDER — ONDANSETRON 4 MG PO TBDP: 4.0000 mg | ORAL_TABLET | Freq: Four times a day (QID) | ORAL | 1 refills | Status: DC | PRN

## 2021-10-20 MED ORDER — ATORVASTATIN CALCIUM 40 MG PO TABS: 40.0000 mg | ORAL_TABLET | Freq: Every day | ORAL | 11 refills | Status: DC

## 2021-10-20 MED ORDER — METRONIDAZOLE 500 MG PO TABS: 500.0000 mg | ORAL_TABLET | Freq: Three times a day (TID) | ORAL | 0 refills | Status: AC

## 2021-10-20 MED ORDER — MECLIZINE HCL 25 MG PO TABS
25.0000 mg | ORAL_TABLET | Freq: Three times a day (TID) | ORAL | 0 refills | Status: DC | PRN
Start: 2021-10-20 — End: 2022-07-29

## 2021-10-20 MED ORDER — ONDANSETRON HCL 4 MG PO TABS: 4.0000 mg | ORAL_TABLET | Freq: Four times a day (QID) | ORAL | 0 refills | Status: DC | PRN

## 2021-10-20 NOTE — Discharge Instructions (Signed)
1) please take aspirin and Lipitor for secondary stroke prevention ?2)Please follow-up with Neurologist Dr. Phillips Odor-- Phone: [phone], Address: [address], [place] [address] in 4 to 6 weeks for recheck and reevaluation.  Please call to make appointment with him ?3) if you have hypercoagulability test Panel results does not indicate any genetic reason for strokes or blood clots you will need a Zio patch heart monitor to look for irregular heartbeat called atrial fibrillation ?4)Please complete Omnicef and Flagyl as prescribed for colitis/bowel infection ?5)Complete Abstinence from tobacco strongly advised ?6) please talk to your primary care physician about coming off your Depo shot and avoiding hormonal type of contraception given increased rate of blood clots and strokes in women over 82 years of age who are smokers and take hormonal contraception ?

## 2021-10-20 NOTE — TOC Progression Note (Addendum)
Transition of Care (TOC) - Progression Note  ? ? ?Patient Details  ?Name: Nancy Cochran ?MRN: [id number] ?Date of Birth: May 28, 1976 ? ?Transition of Care (TOC) CM/SW Contact  ?Kerin Salen, RN ?Phone Number: ?10/20/2021, 12:53 PM ? ?Clinical Narrative:  Due to improve condition patient declines CIR, will prefer outpatient services when discharged.  ?TOCRN spoke with patient about outpatient services, patient feels that her progress may not require services, she will wait until discharge Monday. Currently she has no Insurance coverage and not aware of free clinics in the area. ? ? ? ?  ?Barriers to Discharge: Continued Medical Work up ? ?Expected Discharge Plan and Services ?  ?  ?  ?  ?  ?Expected Discharge Date: 10/20/21               ?  ?  ?  ?  ?  ?  ?  ?  ?  ?  ? ? ?Social Determinants of Health (SDOH) Interventions ?  ? ?Readmission Risk Interventions ?No flowsheet data found. ? ?

## 2021-10-20 NOTE — Progress Notes (Signed)
Physical Therapy Treatment ?Patient Details ?Name: Nancy Cochran ?MRN: [id number] ?DOB: 11-Jul-1976 ?Today's Date: 10/20/2021 ? ? ?History of Present Illness Nancy Cochran  is a 46 y.o. female patient with history of tobacco abuse, anxiety disorder and insomnia presents with persistent vertigo associated with nausea and vomiting of less than 24-hour duration.  Imaging demonstrates a recent CVA.  Pt states she feels much better today, she is functioning much better as well. ?  ?PT Comments  ? ? Pt demonstrates I in bed mobility and transfers.  Mod I with ambulation secondary to balance pt will benefit from a rolling walker which pt states that she already has.  Therapy recommends OP PT at discharge.    ?Recommendations for follow up therapy are one component of a multi-disciplinary discharge planning process, led by the attending physician.  Recommendations may be updated based on patient status, additional functional criteria and insurance authorization. ? ?Follow Up Recommendations ? Outpatient PT ?  ?  ?Assistance Recommended at Discharge Intermittent Supervision/Assistance  ?Patient can return home with the following Assistance with cooking/housework;Help with stairs or ramp for entrance ?  ?Equipment Recommendations ? None recommended by PT  ?  ?Recommendations for Other Services OP OT ? ? ?  ?Precautions / Restrictions none   ?  ? ?Mobility ? Bed Mobility ?Overal bed mobility: Independent ?  ?  ?  ?Supine to sit: Independent ?Sit to supine: Independent ?  ?  ?  ? ?Transfers ?Overall transfer level: Independent ?Equipment used: Rolling walker (2 wheels) ?  ?Sit to Stand: Independent ?  ?Step pivot transfers: Independent ?  ?  ?  ?  ?  ? ?Ambulation/Gait ?Ambulation/Gait assistance: Modified independent (Device/Increase time) ?Gait Distance (Feet): 120 Feet ?Assistive device: Rolling walker (2 wheels) ?Gait Pattern/deviations: Step-through pattern ?  ? Pt demonstrated the ability to walk forward and backward in  room.   ?  ?  ? ? ?  ? ? ?  ?   ?Cognition Arousal/Alertness: Awake/alert ?Behavior During Therapy: [facility] for tasks assessed/performed ?Overall Cognitive Status: Within Functional Limits for tasks assessed ?  ?  ?  ?  ?  ?  ?  ?  ?  ?  ?  ?  ?  ?  ?  ?  ?  ?  ?  ? ?  ?   ?General Comments   ?  ?  ? ?Pertinent Vitals/Pain Pain Assessment ?Pain Assessment: No/denies pain  ? ? ?Home Living Family/patient expects to be discharged to:: Private residence ?Living Arrangements: Spouse/significant other ?Available Help at Discharge: Family;Available 24 hours/day ?Type of Home: House ?Home Access: Ramped entrance ?  ?  ?  ?Home Layout: One level ?Home Equipment: Conservation officer, nature (2 wheels);Rollator (4 wheels);Cane - quad;Shower seat;BSC/3in1;Wheelchair - manual ?   ?  ?Prior Function    ?  ?  ?   ? ?PT Goals (current goals can now be found in the care plan section) Acute Rehab PT Goals ?Patient Stated Goal: to go home ?PT Goal Formulation: With patient ?Time For Goal Achievement: 10/20/21 ?Potential to Achieve Goals: Good ? ?  ?Frequency ? ? ? Min 4X/week ? ? ? ?  ?   ? ? ?   ?End of Session Equipment Utilized During Treatment: Gait belt ?Activity Tolerance: Patient tolerated treatment well;Patient limited by fatigue;Other (comment) ?Patient left: in bed;with call bell/phone within reach;with family/visitor present ?Nurse Communication: Mobility status ?PT Visit Diagnosis: Unsteadiness on feet (R26.81);Other abnormalities of gait and mobility (R26.89);Muscle weakness (generalized) (M62.81) ?  ? ? ?  Time: 1010-1040 ?PT Time Calculation (min) (ACUTE ONLY): 30 min ? ?Charges:  $Gait Training: 8-22 mins ?$Therapeutic Activity: 8-22 mins          ?          ? ? ? ?Rayetta Humphrey, PT CLT ?[phone]  ?10/20/2021, 11:43 AM ? ?

## 2021-10-20 NOTE — Discharge Summary (Incomplete)
Nancy Cochran, is a 46 y.o. female  DOB 09-Oct-1975  MRN [id number].  Admission date:  10/18/2021  Admitting Physician  Santiago Stenzel Denton Brick, MD  Discharge Date:  10/20/2021   Primary MD  Luetta Nutting, DO  Recommendations for primary care physician for things to follow:   1) please take aspirin and Lipitor for secondary stroke prevention 2)Please follow-up with Neurologist Dr. Phillips Odor-- Phone: [phone], Address: [address], [place], [place] [address] in 4 to 6 weeks for recheck and reevaluation.  Please call to make appointment with him 3) if you have hypercoagulability test Panel results does not indicate any genetic reason for strokes or blood clots you will need a Zio patch heart monitor to look for irregular heartbeat called atrial fibrillation 4)Please complete Omnicef and Flagyl as prescribed for colitis/bowel infection 5)Complete Abstinence from tobacco strongly advised 6) please talk to your primary care physician about coming off your Depo shot and avoiding hormonal type of contraception given increased rate of blood clots and strokes in women over 55 years of age who are smokers and take hormonal contraception  Admission Diagnosis  Colitis [K52.9] Cerebellar stroke, acute ([place]) [I63.9]   Discharge Diagnosis  Colitis [K52.9] Cerebellar stroke, acute ([place]) [I63.9]  ***  Principal Problem:   Cerebellar stroke, acute ([place]) Active Problems:   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   Colitis      Past Medical History:  Diagnosis Date   DDD (degenerative disc disease), cervical 2016   Dr. Karie Schwalbe   Gestational diabetes     History reviewed. No pertinent surgical history.     HPI  from the history and physical done on the day of admission:     ***  ****     Hospital Course:     No notes on file  ***** Assessment and Plan: No notes have been filed  under this hospital service. Service: Hospitalist       Discharge Condition: ***  Follow UP   Follow-up Information     Phillips Odor, MD Follow up in 1 month(s).   Specialty: Neurology Why: acute stroke Contact information: [address] [address] [phone]                  Consults obtained - ***  Diet and Activity recommendation:  As advised  Discharge Instructions    **** Discharge Instructions     Ambulatory referral to Physical Therapy   Complete by: As directed    Call MD for:  difficulty breathing, headache or visual disturbances   Complete by: As directed    Call MD for:  difficulty breathing, headache or visual disturbances   Complete by: As directed    Call MD for:  persistant dizziness or light-headedness   Complete by: As directed    Call MD for:  persistant dizziness or light-headedness   Complete by: As directed    Call MD for:  persistant nausea and vomiting   Complete by: As directed    Call  MD for:  persistant nausea and vomiting   Complete by: As directed    Call MD for:  severe uncontrolled pain   Complete by: As directed    Call MD for:  temperature >100.4   Complete by: As directed    Call MD for:  temperature >100.4   Complete by: As directed    Diet - low sodium heart healthy   Complete by: As directed    Diet - low sodium heart healthy   Complete by: As directed    Discharge instructions   Complete by: As directed    1) please take aspirin and Lipitor for secondary stroke prevention 2)Please follow-up with Neurologist Dr. Phillips Odor-- Phone: [phone], Address: [address], [place], [place] [address] in 4 to 6 weeks for recheck and reevaluation.  Please call to make appointment with him 3) if you have hypercoagulability test Panel results does not indicate any genetic reason for strokes or blood clots you will need a Zio patch heart monitor to look for irregular heartbeat called atrial  fibrillation 4)Please complete Omnicef and Flagyl as prescribed for colitis/bowel infection 5)Complete Abstinence from tobacco strongly advised 6) please talk to your primary care physician about coming off your Depo shot and avoiding hormonal type of contraception given increased rate of blood clots and strokes in women over 65 years of age who are smokers and take hormonal contraception   Discharge instructions   Complete by: As directed    1) please take aspirin and Lipitor for secondary stroke prevention 2)Please follow-up with Neurologist Dr. Phillips Odor-- Phone: [phone], Address: [address], [place],  [address] in 4 to 6 weeks for recheck and reevaluation.  Please call to make appointment with him 3) if you have hypercoagulability test Panel results does not indicate any genetic reason for strokes or blood clots you will need a Zio patch heart monitor to look for irregular heartbeat called atrial fibrillation 4)Please complete Omnicef and Flagyl as prescribed for colitis/bowel infection 5)Complete Abstinence from tobacco strongly advised 6) please talk to your primary care physician about coming off your Depo shot and avoiding hormonal type of contraception given increased rate of blood clots and strokes in women over 30 years of age who are smokers and take hormonal contraception   Increase activity slowly   Complete by: As directed    -- Fall precautions, outpatient physical therapy advised   Increase activity slowly   Complete by: As directed    Fall Precautions--No driving for next 72 hrs and if still Dizzy after that         Discharge Medications     Allergies as of 10/20/2021   No Known Allergies      Medication List     STOP taking these medications    hydrOXYzine 25 MG tablet Commonly known as: ATARAX   ibuprofen 400 MG tablet Commonly known as: ADVIL   medroxyPROGESTERone 150 MG/ML injection Commonly known as: DEPO-PROVERA    multivitamin-iron-minerals-folic acid chewable tablet       TAKE these medications    acetaminophen 325 MG tablet Commonly known as: TYLENOL Take 2 tablets (650 mg total) by mouth every 6 (six) hours as needed for mild pain, fever or headache (or Fever >/= 101).   albuterol 108 (90 Base) MCG/ACT inhaler Commonly known as: VENTOLIN HFA Inhale 2 puffs into the lungs every 6 (six) hours as needed for wheezing or shortness of breath.   amitriptyline 25 MG tablet  Commonly known as: ELAVIL Take 1 tab PO qhs   aspirin 81 MG EC tablet Take 1 tablet (81 mg total) by mouth daily with breakfast. Swallow whole. Start taking on: October 21, 2021   atorvastatin 40 MG tablet Commonly known as: LIPITOR Take 1 tablet (40 mg total) by mouth daily. Start taking on: October 21, 2021   cefdinir 300 MG capsule Commonly known as: OMNICEF Take 1 capsule (300 mg total) by mouth 2 (two) times daily for 7 days.   meclizine 25 MG tablet Commonly known as: ANTIVERT Take 1 tablet (25 mg total) by mouth 3 (three) times daily as needed for dizziness or nausea (Vertigo).   metroNIDAZOLE 500 MG tablet Commonly known as: FLAGYL Take 1 tablet (500 mg total) by mouth 3 (three) times daily for 7 days.   ondansetron 4 MG disintegrating tablet Commonly known as: Zofran ODT Take 1 tablet (4 mg total) by mouth every 6 (six) hours as needed for nausea or vomiting. What changed: when to take this   ondansetron 4 MG tablet Commonly known as: ZOFRAN Take 1 tablet (4 mg total) by mouth every 6 (six) hours as needed for nausea.   zolpidem 10 MG tablet Commonly known as: AMBIEN TAKE 1/2-1 TABLET BY MOUTH EVERY DAY AT BEDTIME AS NEEDED FOR SLEEP        Major procedures and Radiology Reports - PLEASE review detailed and final reports for all details, in brief -   ***  CT Head Wo Contrast  Result Date: 10/18/2021 CLINICAL DATA:  Vertigo, central EXAM: CT HEAD WITHOUT CONTRAST TECHNIQUE: Contiguous axial  images were obtained from the base of the skull through the vertex without intravenous contrast. RADIATION DOSE REDUCTION: This exam was performed according to the departmental dose-optimization program which includes automated exposure control, adjustment of the mA and/or kV according to patient size and/or use of iterative reconstruction technique. COMPARISON:  None. FINDINGS: Brain: There is no acute intracranial hemorrhage, mass effect, or edema. Gray-white differentiation is preserved. There is no extra-axial fluid collection. Ventricles and sulci are within normal limits in size and configuration. Vascular: No hyperdense vessel or unexpected calcification. Skull: Calvarium is unremarkable. Sinuses/Orbits: No acute finding. Other: None. IMPRESSION: No acute intracranial abnormality. Electronically Signed   By: Macy Mis M.D.   On: 10/18/2021 08:49   CT ANGIO NECK W OR WO CONTRAST  Result Date: 10/19/2021 CLINICAL DATA:  Nausea, vomiting, dizziness, history of vertigo EXAM: CT ANGIOGRAPHY NECK TECHNIQUE: Multidetector CT imaging of the neck was performed using the standard protocol during bolus administration of intravenous contrast. Multiplanar CT image reconstructions and MIPs were obtained to evaluate the vascular anatomy. Carotid stenosis measurements (when applicable) are obtained utilizing NASCET criteria, using the distal internal carotid diameter as the denominator. RADIATION DOSE REDUCTION: This exam was performed according to the departmental dose-optimization program which includes automated exposure control, adjustment of the mA and/or kV according to patient size and/or use of iterative reconstruction technique. CONTRAST:  3m OMNIPAQUE IOHEXOL 350 MG/ML SOLN COMPARISON:  None. FINDINGS: Aortic arch: The aortic arch is unremarkable. There is a common origin of the brachiocephalic and left common carotid arteries, a normal variant. The subclavian arteries are patent to the level imaged.  Right carotid system: Right common, internal, and external carotid arteries are patent, without hemodynamically significant stenosis or occlusion. There is no dissection or aneurysm. Left carotid system: The left common, internal, and external carotid arteries are patent, without hemodynamically significant stenosis or occlusion. There is no dissection or  aneurysm. Vertebral arteries: The vertebral arteries are widely patent, without hemodynamically significant stenosis or occlusion. There is no evidence of dissection or aneurysm. Skeleton: There is no acute osseous abnormality or aggressive osseous lesion. There is no significant degenerative change in the cervical spine. There is no visible canal hematoma. Other neck: The soft tissues are unremarkable. Upper chest: The imaged lung apices are clear. IMPRESSION: Normal CTA neck. No hemodynamically significant stenosis, occlusion, dissection, or aneurysm. Electronically Signed   By: Valetta Mole M.D.   On: 10/19/2021 13:02   MR ANGIO HEAD WO CONTRAST  Result Date: 10/18/2021 CLINICAL DATA:  Initial evaluation for acute dizziness. EXAM: MRI HEAD WITHOUT CONTRAST MRA HEAD WITHOUT CONTRAST TECHNIQUE: Multiplanar, multi-echo pulse sequences of the brain and surrounding structures were acquired without intravenous contrast. Angiographic images of the Circle of Willis were acquired using MRA technique without intravenous contrast. COMPARISON:  Prior head CT from earlier the same day. FINDINGS: MRI HEAD FINDINGS Brain: Cerebral volume within normal limits. Few scattered subcentimeter foci of T2/FLAIR hyperintensity noted involving the supratentorial cerebral white matter, nonspecific, but overall minimal in nature, and of doubtful significance. Small focus of restricted diffusion seen involving the superior right cerebellum and cerebellar vermis, consistent with an acute ischemic infarct (series 5, images 11-7). No associated hemorrhage or mass effect. No other evidence  for acute or subacute ischemia. Gray-white matter differentiation otherwise maintained. No other areas of remote cortical infarction. No acute or chronic intracranial blood products. No mass lesion or midline shift. No hydrocephalus or extra-axial fluid collection. Pituitary gland suprasellar region within normal limits. Midline structures intact. Vascular: Major intracranial vascular flow voids are maintained. Skull and upper cervical spine: Craniocervical junction within normal limits. Bone marrow signal intensity normal. No scalp soft tissue abnormality. Sinuses/Orbits: Globes orbital soft tissues within normal limits. Scattered mucosal thickening noted about the ethmoidal air cells and maxillary sinuses. Paranasal sinuses are otherwise clear. No mastoid effusion. Other: None. MRA HEAD FINDINGS Anterior circulation: Examination moderately degraded by motion artifact. Visualized distal cervical segments of the internal carotid arteries are patent with antegrade flow. Petrous, cavernous, and supraclinoid segments patent without hemodynamically significant stenosis or other abnormality. A1 segments patent bilaterally. Normal anterior communicating artery complex. Both ACAs patent to their distal aspects without stenosis. M1 segments widely patent bilaterally. Left M1 is fenestrated distally. Normal MCA bifurcations. Distal MCA branches perfused and symmetric. Posterior circulation: Visualized V4 segments widely patent. Right PICA patent at its origin. Left PICA not well seen. Basilar patent to its distal aspect without stenosis. Superior cerebral arteries patent bilaterally. Both PCAs primarily supplied via the basilar well perfused to their distal aspects. Anatomic variants: None significant.  No intracranial aneurysm. IMPRESSION: MRI HEAD IMPRESSION: 1. Small acute ischemic nonhemorrhagic infarct involving the superior right cerebellum. 2. Otherwise normal brain MRI for age. MRA HEAD IMPRESSION: 1. Motion degraded  exam. 2. Negative intracranial MRA. No large vessel occlusion, hemodynamically significant stenosis, or other acute vascular abnormality. Electronically Signed   By: Jeannine Boga M.D.   On: 10/18/2021 19:21   MR BRAIN WO CONTRAST  Result Date: 10/18/2021 CLINICAL DATA:  Initial evaluation for acute dizziness. EXAM: MRI HEAD WITHOUT CONTRAST MRA HEAD WITHOUT CONTRAST TECHNIQUE: Multiplanar, multi-echo pulse sequences of the brain and surrounding structures were acquired without intravenous contrast. Angiographic images of the Circle of Willis were acquired using MRA technique without intravenous contrast. COMPARISON:  Prior head CT from earlier the same day. FINDINGS: MRI HEAD FINDINGS Brain: Cerebral volume within normal limits. Few scattered subcentimeter  foci of T2/FLAIR hyperintensity noted involving the supratentorial cerebral white matter, nonspecific, but overall minimal in nature, and of doubtful significance. Small focus of restricted diffusion seen involving the superior right cerebellum and cerebellar vermis, consistent with an acute ischemic infarct (series 5, images 11-7). No associated hemorrhage or mass effect. No other evidence for acute or subacute ischemia. Gray-white matter differentiation otherwise maintained. No other areas of remote cortical infarction. No acute or chronic intracranial blood products. No mass lesion or midline shift. No hydrocephalus or extra-axial fluid collection. Pituitary gland suprasellar region within normal limits. Midline structures intact. Vascular: Major intracranial vascular flow voids are maintained. Skull and upper cervical spine: Craniocervical junction within normal limits. Bone marrow signal intensity normal. No scalp soft tissue abnormality. Sinuses/Orbits: Globes orbital soft tissues within normal limits. Scattered mucosal thickening noted about the ethmoidal air cells and maxillary sinuses. Paranasal sinuses are otherwise clear. No mastoid effusion.  Other: None. MRA HEAD FINDINGS Anterior circulation: Examination moderately degraded by motion artifact. Visualized distal cervical segments of the internal carotid arteries are patent with antegrade flow. Petrous, cavernous, and supraclinoid segments patent without hemodynamically significant stenosis or other abnormality. A1 segments patent bilaterally. Normal anterior communicating artery complex. Both ACAs patent to their distal aspects without stenosis. M1 segments widely patent bilaterally. Left M1 is fenestrated distally. Normal MCA bifurcations. Distal MCA branches perfused and symmetric. Posterior circulation: Visualized V4 segments widely patent. Right PICA patent at its origin. Left PICA not well seen. Basilar patent to its distal aspect without stenosis. Superior cerebral arteries patent bilaterally. Both PCAs primarily supplied via the basilar well perfused to their distal aspects. Anatomic variants: None significant.  No intracranial aneurysm. IMPRESSION: MRI HEAD IMPRESSION: 1. Small acute ischemic nonhemorrhagic infarct involving the superior right cerebellum. 2. Otherwise normal brain MRI for age. MRA HEAD IMPRESSION: 1. Motion degraded exam. 2. Negative intracranial MRA. No large vessel occlusion, hemodynamically significant stenosis, or other acute vascular abnormality. Electronically Signed   By: Jeannine Boga M.D.   On: 10/18/2021 19:21   CT ABDOMEN PELVIS W CONTRAST  Result Date: 10/18/2021 CLINICAL DATA:  Left lower quadrant abdominal pain EXAM: CT ABDOMEN AND PELVIS WITH CONTRAST TECHNIQUE: Multidetector CT imaging of the abdomen and pelvis was performed using the standard protocol following bolus administration of intravenous contrast. RADIATION DOSE REDUCTION: This exam was performed according to the departmental dose-optimization program which includes automated exposure control, adjustment of the mA and/or kV according to patient size and/or use of iterative reconstruction  technique. CONTRAST:  114m OMNIPAQUE IOHEXOL 300 MG/ML  SOLN COMPARISON:  10/02/2007 FINDINGS: Lower chest: No acute abnormality. Hepatobiliary: No focal liver abnormality is seen. No gallstones, gallbladder wall thickening, or biliary dilatation. Pancreas: Unremarkable. No pancreatic ductal dilatation or surrounding inflammatory changes. Spleen: Normal in size without focal abnormality. Adrenals/Urinary Tract: Unremarkable adrenal glands. Kidneys enhance symmetrically without focal lesion, stone, or hydronephrosis. Ureters are nondilated. Urinary bladder appears unremarkable. Stomach/Bowel: Stomach and small bowel within normal limits. No dilated loops of bowel. Pancolonic mild circumferential bowel wall thickening with relative sparing of the rectum. Normal appendix in the right lower quadrant. Vascular/Lymphatic: Scattered aortoiliac atherosclerotic calcifications without aneurysm. No abdominopelvic lymphadenopathy. Reproductive: Uterus and bilateral adnexa are unremarkable. Other: No free fluid. No abdominopelvic fluid collection. No pneumoperitoneum. No abdominal wall hernia. Musculoskeletal: No acute or significant osseous findings. IMPRESSION: Pancolonic mild circumferential bowel wall thickening with relative sparing of the rectum, suggestive of an infectious or inflammatory colitis. Aortic Atherosclerosis (ICD10-I70.0). Electronically Signed   By: NDavina PokeD.O.  On: 10/18/2021 11:13   ECHOCARDIOGRAM COMPLETE  Result Date: 10/19/2021    ECHOCARDIOGRAM REPORT   Patient Name:   Nancy Cochran Date of Exam: 10/19/2021 Medical Rec #:  [id number]      Height:       64.0 in Accession #:    [id number]     Weight:       140.0 lb Date of Birth:  October 30, 1975     BSA:          1.681 m Patient Age:    28 years       BP:           110/72 mmHg Patient Gender: F              HR:           98 bpm. Exam Location:  [facility] Procedure: 2D Echo, Cardiac Doppler and Color Doppler Indications:    Stroke  History:         Patient has no prior history of Echocardiogram examinations.                 Stroke; Risk Factors:Current Smoker.  Sonographer:    Wenda Low Referring Phys: [place]  1. Left ventricular ejection fraction, by estimation, is 55 to 60%. The left ventricle has normal function. The left ventricle has no regional wall motion abnormalities. Left ventricular diastolic parameters were normal.  2. Right ventricular systolic function is normal. The right ventricular size is normal. Tricuspid regurgitation signal is inadequate for assessing PA pressure.  3. The mitral valve is grossly normal. Mild mitral valve regurgitation.  4. The aortic valve is tricuspid. Aortic valve regurgitation is not visualized. No aortic stenosis is present. Aortic valve mean gradient measures 3.0 mmHg.  5. The inferior vena cava is normal in size with greater than 50% respiratory variability, suggesting right atrial pressure of 3 mmHg. Comparison(s): No prior Echocardiogram. FINDINGS  Left Ventricle: Left ventricular ejection fraction, by estimation, is 55 to 60%. The left ventricle has normal function. The left ventricle has no regional wall motion abnormalities. The left ventricular internal cavity size was normal in size. There is  borderline left ventricular hypertrophy. Left ventricular diastolic parameters were normal. Right Ventricle: The right ventricular size is normal. No increase in right ventricular wall thickness. Right ventricular systolic function is normal. Tricuspid regurgitation signal is inadequate for assessing PA pressure. Left Atrium: Left atrial size was normal in size. Right Atrium: Right atrial size was normal in size. Pericardium: There is no evidence of pericardial effusion. Mitral Valve: The mitral valve is grossly normal. Mild mitral valve regurgitation. MV peak gradient, 4.5 mmHg. The mean mitral valve gradient is 2.0 mmHg. Tricuspid Valve: The tricuspid valve is grossly normal.  Tricuspid valve regurgitation is trivial. Aortic Valve: The aortic valve is tricuspid. Aortic valve regurgitation is not visualized. No aortic stenosis is present. Aortic valve mean gradient measures 3.0 mmHg. Aortic valve peak gradient measures 6.8 mmHg. Aortic valve area, by VTI measures 2.06 cm. Pulmonic Valve: The pulmonic valve was grossly normal. Pulmonic valve regurgitation is trivial. Aorta: The aortic root is normal in size and structure. Venous: The inferior vena cava is normal in size with greater than 50% respiratory variability, suggesting right atrial pressure of 3 mmHg. IAS/Shunts: No atrial level shunt detected by color flow Doppler.  LEFT VENTRICLE PLAX 2D LVIDd:         3.70 cm     Diastology LVIDs:  2.40 cm     LV e' medial:    13.40 cm/s LV PW:         1.10 cm     LV E/e' medial:  6.7 LV IVS:        0.80 cm     LV e' lateral:   12.00 cm/s LVOT diam:     2.00 cm     LV E/e' lateral: 7.5 LV SV:         55 LV SV Index:   33 LVOT Area:     3.14 cm  LV Volumes (MOD) LV vol d, MOD A2C: 40.8 ml LV vol d, MOD A4C: 37.0 ml LV vol s, MOD A2C: 16.7 ml LV vol s, MOD A4C: 17.8 ml LV SV MOD A2C:     24.1 ml LV SV MOD A4C:     37.0 ml LV SV MOD BP:      21.5 ml RIGHT VENTRICLE RV Basal diam:  2.35 cm RV Mid diam:    2.50 cm RV S prime:     17.50 cm/s TAPSE (M-mode): 2.9 cm LEFT ATRIUM             Index        RIGHT ATRIUM          Index LA diam:        2.50 cm 1.49 cm/m   RA Area:     9.12 cm LA Vol (A2C):   33.4 ml 19.87 ml/m  RA Volume:   16.60 ml 9.87 ml/m LA Vol (A4C):   32.8 ml 19.51 ml/m LA Biplane Vol: 34.5 ml 20.52 ml/m  AORTIC VALVE                    PULMONIC VALVE AV Area (Vmax):    2.18 cm     PV Vmax:       0.81 m/s AV Area (Vmean):   2.09 cm     PV Peak grad:  2.7 mmHg AV Area (VTI):     2.06 cm AV Vmax:           130.00 cm/s AV Vmean:          82.700 cm/s AV VTI:            0.268 m AV Peak Grad:      6.8 mmHg AV Mean Grad:      3.0 mmHg LVOT Vmax:         90.10 cm/s LVOT Vmean:         55.100 cm/s LVOT VTI:          0.176 m LVOT/AV VTI ratio: 0.66  AORTA Ao Root diam: 3.00 cm MITRAL VALVE MV Area (PHT): 5.46 cm    SHUNTS MV Area VTI:   2.56 cm    Systemic VTI:  0.18 m MV Peak grad:  4.5 mmHg    Systemic Diam: 2.00 cm MV Mean grad:  2.0 mmHg MV Vmax:       1.06 m/s MV Vmean:      67.5 cm/s MV Decel Time: 139 msec MV E velocity: 89.50 cm/s MV A velocity: 98.50 cm/s MV E/A ratio:  0.91 Rozann Lesches MD Electronically signed by Rozann Lesches MD Signature Date/Time: 10/19/2021/1:14:17 PM    Final     Micro Results   *** Recent Results (from the past 240 hour(s))  Resp Panel by RT-PCR (Flu A&B, Covid) Nasopharyngeal Swab     Status: None   Collection Time:  10/18/21 10:07 AM   Specimen: Nasopharyngeal Swab; Nasopharyngeal(NP) swabs in vial transport medium  Result Value Ref Range Status   SARS Coronavirus 2 by RT PCR NEGATIVE NEGATIVE Final    Comment: (NOTE) SARS-CoV-2 target nucleic acids are NOT DETECTED.  The SARS-CoV-2 RNA is generally detectable in upper respiratory specimens during the acute phase of infection. The lowest concentration of SARS-CoV-2 viral copies this assay can detect is 138 copies/mL. A negative result does not preclude SARS-Cov-2 infection and should not be used as the sole basis for treatment or other patient management decisions. A negative result may occur with  improper specimen collection/handling, submission of specimen other than nasopharyngeal swab, presence of viral mutation(s) within the areas targeted by this assay, and inadequate number of viral copies(<138 copies/mL). A negative result must be combined with clinical observations, patient history, and epidemiological information. The expected result is Negative.  Fact Sheet for Patients:  EntrepreneurPulse.com.au  Fact Sheet for Healthcare Providers:  IncredibleEmployment.be  This test is no t yet approved or cleared by the [place]  FDA and  has been authorized for detection and/or diagnosis of SARS-CoV-2 by FDA under an Emergency Use Authorization (EUA). This EUA will remain  in effect (meaning this test can be used) for the duration of the COVID-19 declaration under Section 564(b)(1) of the Act, 21 U.S.C.section 360bbb-3(b)(1), unless the authorization is terminated  or revoked sooner.       Influenza A by PCR NEGATIVE NEGATIVE Final   Influenza B by PCR NEGATIVE NEGATIVE Final    Comment: (NOTE) The Xpert Xpress SARS-CoV-2/FLU/RSV plus assay is intended as an aid in the diagnosis of influenza from Nasopharyngeal swab specimens and should not be used as a sole basis for treatment. Nasal washings and aspirates are unacceptable for Xpert Xpress SARS-CoV-2/FLU/RSV testing.  Fact Sheet for Patients: EntrepreneurPulse.com.au  Fact Sheet for Healthcare Providers: IncredibleEmployment.be  This test is not yet approved or cleared by the [place] FDA and has been authorized for detection and/or diagnosis of SARS-CoV-2 by FDA under an Emergency Use Authorization (EUA). This EUA will remain in effect (meaning this test can be used) for the duration of the COVID-19 declaration under Section 564(b)(1) of the Act, 21 U.S.C. section 360bbb-3(b)(1), unless the authorization is terminated or revoked.  Performed at [facility], [address], [place], [place] [address]     Today   [place] today has no ***          Patient has been seen and examined prior to discharge   Objective   Blood pressure 128/79, pulse (!) 101, temperature 98.1 F (36.7 C), temperature source Oral, resp. rate 18, height 5' 4"  (1.626 m), weight 63.5 kg, SpO2 97 %.   Intake/Output Summary (Last 24 hours) at 10/20/2021 1629 Last data filed at 10/20/2021 1300 Gross per 24 hour  Intake 600 ml  Output 1000 ml  Net -400 ml    Exam Gen:- Awake Alert, no acute distress  *** HEENT:- [place].AT, No sclera icterus Neck-Supple Neck,No JVD,.  Lungs-  CTAB , good air movement bilaterally CV- S1, S2 normal, regular Abd-  +ve B.Sounds, Abd Soft, No tenderness,    Extremity/Skin:- No  edema,   good pulses Psych-affect is appropriate, oriented x3 Neuro-no new focal deficits, no tremors ***   Data Review   CBC w Diff:  Lab Results  Component Value Date   WBC 17.2 (H) 10/19/2021   HGB 11.8 (L) 10/19/2021   HCT 36.1 10/19/2021  PLT 329 10/19/2021    CMP:  Lab Results  Component Value Date   NA 140 10/19/2021   K 3.4 (L) 10/19/2021   CL 111 10/19/2021   CO2 22 10/19/2021   BUN 12 10/19/2021   CREATININE 0.68 10/19/2021   CREATININE 0.72 10/12/2020   PROT 6.3 (L) 10/18/2021   ALBUMIN 3.6 10/18/2021   BILITOT 0.4 10/18/2021   ALKPHOS 73 10/18/2021   AST 13 (L) 10/18/2021   ALT 12 10/18/2021  .  Total Discharge time is about 33 minutes  Roxan Hockey M.D on 10/20/2021 at 4:29 PM  Go to [email or web address] -  for contact info  [facility] - Office  [phone]

## 2021-10-20 NOTE — Progress Notes (Signed)
Inpatient Rehab Admissions: ? ?Inpatient Rehab Consult received.  I spoke with patient on the telephone for rehabilitation assessment and to discuss goals and expectations of an inpatient rehab admission.  She is not interested in CIR. She would prefer to go home and receive OP versus HH therapy. Note PT is now recommending OP PT for pt after discharge. TOC made aware. AC will sign off.  ? ?Signed: ?Gayland Curry, MS, CCC-SLP ?Admissions Coordinator ?[phone] ? ? ?

## 2021-10-22 ENCOUNTER — Telehealth: Payer: Self-pay | Admitting: General Practice

## 2021-10-22 LAB — ANCA TITERS
Atypical P-ANCA titer: <1:20 {titer}
C-ANCA: <1:20 {titer}
P-ANCA: <1:20 {titer}

## 2021-10-22 LAB — LUPUS ANTICOAGULANT PANEL
DRVVT: 40.7 s (ref 0.0–47.0)
PTT Lupus Anticoagulant: 33.3 s (ref 0.0–43.5)

## 2021-10-22 LAB — SJOGRENS SYNDROME-B EXTRACTABLE NUCLEAR ANTIBODY: SSB (La) (ENA) Antibody, IgG: <0.2 AI (ref 0.0–0.9)

## 2021-10-22 LAB — BETA-2-GLYCOPROTEIN I ABS, IGG/M/A
Beta-2 Glyco I IgG: <9 GPI IgG units (ref 0–20)
Beta-2-Glycoprotein I IgA: <9 GPI IgA units (ref 0–25)
Beta-2-Glycoprotein I IgM: <9 GPI IgM units (ref 0–32)

## 2021-10-22 LAB — HOMOCYSTEINE: Homocysteine: 14.7 umol/L — ABNORMAL HIGH (ref 0.0–14.5)

## 2021-10-22 LAB — SJOGRENS SYNDROME-A EXTRACTABLE NUCLEAR ANTIBODY: SSA (Ro) (ENA) Antibody, IgG: <0.2 AI (ref 0.0–0.9)

## 2021-10-22 LAB — PROTEIN C, TOTAL: Protein C, Total: 83 % (ref 60–150)

## 2021-10-22 LAB — PROTEIN S, TOTAL: Protein S Ag, Total: 113 % (ref 60–150)

## 2021-10-22 LAB — PROTEIN S ACTIVITY: Protein S Activity: 101 % (ref 63–140)

## 2021-10-22 LAB — PROTEIN C ACTIVITY: Protein C Activity: 111 % (ref 73–180)

## 2021-10-22 NOTE — Telephone Encounter (Signed)
Transition Care Management Follow-up Telephone Call ?Date of discharge and from where: 10/20/21 from [facility] ?How have you been since you were released from the hospital? Doing ok. She is still sore. ?Any questions or concerns? No ? ?Items Reviewed: ?Did the pt receive and understand the discharge instructions provided? Yes  ?Medications obtained and verified? No  ?Other? No  ?Any new allergies since your discharge? No  ?Dietary orders reviewed? No ?Do you have support at home? Yes  ? ?Home Care and Equipment/Supplies: ?Were home health services ordered? no ? ?Functional Questionnaire: (I = Independent and D = Dependent) ?ADLs: I ? ?Bathing/Dressing- I ? ?Meal Prep- I ? ?Eating- I ? ?Maintaining continence- I ? ?Transferring/Ambulation- I ? ?Managing Meds- I ? ?Follow up appointments reviewed: ? ?PCP Hospital f/u appt confirmed? Yes  Scheduled to see Dr. Zigmund Daniel on 10/25/21 @ 310pm. ?[facility] f/u appt confirmed? No; she doesn't have that appt scheduled yet. ?Are transportation arrangements needed? No  ?If their condition worsens, is the pt aware to call PCP or go to the Emergency Dept.? Yes ?Was the patient provided with contact information for the PCP's office or ED? Yes ?Was to pt encouraged to call back with questions or concerns? Yes  ?

## 2021-10-23 LAB — PROTEIN ELECTROPHORESIS, SERUM
A/G Ratio: 1.2 (ref 0.7–1.7)
Albumin ELP: 3.3 g/dL (ref 2.9–4.4)
Alpha-1-Globulin: 0.2 g/dL (ref 0.0–0.4)
Alpha-2-Globulin: 0.9 g/dL (ref 0.4–1.0)
Beta Globulin: 0.9 g/dL (ref 0.7–1.3)
Gamma Globulin: 0.7 g/dL (ref 0.4–1.8)
Globulin, Total: 2.7 g/dL (ref 2.2–3.9)
Total Protein ELP: 6 g/dL (ref 6.0–8.5)

## 2021-10-23 LAB — HGB FRACTIONATION CASCADE
Hgb A2: 2 % (ref 1.8–3.2)
Hgb A: 98 % (ref 96.4–98.8)
Hgb F: 0 % (ref 0.0–2.0)
Hgb S: 0 %

## 2021-10-23 LAB — COMPLEMENT, TOTAL: Compl, Total (CH50): >60 U/mL (ref >41)

## 2021-10-24 LAB — PROTHROMBIN GENE MUTATION

## 2021-10-24 LAB — CARDIOLIPIN ANTIBODIES, IGG, IGM, IGA
Anticardiolipin IgA: <9 APL U/mL (ref 0–11)
Anticardiolipin IgG: <9 GPL U/mL (ref 0–14)
Anticardiolipin IgM: <9 MPL U/mL (ref 0–12)

## 2021-10-25 ENCOUNTER — Ambulatory Visit (INDEPENDENT_AMBULATORY_CARE_PROVIDER_SITE_OTHER): Payer: Self-pay | Admitting: Family Medicine

## 2021-10-25 ENCOUNTER — Other Ambulatory Visit: Payer: Self-pay | Admitting: Family Medicine

## 2021-10-25 ENCOUNTER — Other Ambulatory Visit: Payer: Self-pay

## 2021-10-25 ENCOUNTER — Ambulatory Visit (INDEPENDENT_AMBULATORY_CARE_PROVIDER_SITE_OTHER): Payer: Self-pay

## 2021-10-25 ENCOUNTER — Encounter: Payer: Self-pay | Admitting: Family Medicine

## 2021-10-25 VITALS — BP 118/80 | HR 105 | Ht 63.0 in | Wt 141.0 lb

## 2021-10-25 DIAGNOSIS — R000 Tachycardia, unspecified: Secondary | ICD-10-CM

## 2021-10-25 DIAGNOSIS — I639 Cerebral infarction, unspecified: Secondary | ICD-10-CM

## 2021-10-25 DIAGNOSIS — K529 Noninfective gastroenteritis and colitis, unspecified: Secondary | ICD-10-CM

## 2021-10-25 DIAGNOSIS — I4510 Unspecified right bundle-branch block: Secondary | ICD-10-CM

## 2021-10-25 DIAGNOSIS — Z8673 Personal history of transient ischemic attack (TIA), and cerebral infarction without residual deficits: Secondary | ICD-10-CM

## 2021-10-25 DIAGNOSIS — I480 Paroxysmal atrial fibrillation: Secondary | ICD-10-CM

## 2021-10-25 HISTORY — DX: Personal history of transient ischemic attack (TIA), and cerebral infarction without residual deficits: Z86.73

## 2021-10-25 LAB — HM MAMMOGRAPHY

## 2021-10-25 NOTE — Progress Notes (Unsigned)
Enrolled for [employer] to mail a ZIO XT long term holter monitor to the patients address on file.  

## 2021-10-25 NOTE — Patient Instructions (Signed)
Continue current medications.  ?Avoid smoking. ?You should get heart monitor to wear for 2 weeks.  ?Follow up in 3 months.  ?

## 2021-10-25 NOTE — Assessment & Plan Note (Signed)
Recent mission for cerebellar CVA.  She will continue aspirin as well as statin.  Reminded to call and schedule follow-up with neurology.  Hypercoagulability work-up has been negative.  Adding 14-day Zio patch.  I do not think that Depo injections would have a strong association with her stroke.  I do think she needs to remain quit from smoking. ?

## 2021-10-25 NOTE — Progress Notes (Signed)
?Nancy Cochran - 46 y.o. female MRN [id number]  Date of birth: [birth year] ? ?Subjective ?Chief Complaint  ?Patient presents with  ? Hospitalization Follow-up  ? ? ?HPI ?Nancy Cochran is a 46 year old female here today for hospital follow-up.  Recently admitted to the hospital after presenting to the ED with vision loss.  Found to have infarct in the superior right cerebellum.  She reports her vision has improved.  She does have some difficulty with word finding and some weakness and balance issues on the right side.  She was started on aspirin, statin and recommended to discontinue smoking.  She has not smoked since discharge from the hospital.  She has been doing to maintain this however her mother who is staying with her does smoke.  She did have hypercoagulability work-up completed which has been negative so far.  Recommendations were to add Holter if hypercoagulability work-up is negative.  Additionally it was recommended that she discuss her hormonal contraception.  She is currently on progesterone only hormonal contraception which would typically not be expected to cause any significant hypercoagulability.  She did see her GYN this morning as well who presents as well. ? ?Additionally she was noted to have colitis while in the hospital.  This was treated with combination of Flagyl and cefdinir.  She does have a few days left of this.   ? ?ROS:  A comprehensive ROS was completed and negative except as noted per HPI ? ?No Known Allergies ? ?Past Medical History:  ?Diagnosis Date  ? DDD (degenerative disc disease), cervical 2016  ? Dr. Karie Schwalbe  ? Gestational diabetes   ? ? ?History reviewed. No pertinent surgical history. ? ?Social History  ? ?Socioeconomic History  ? Marital status: Married  ?  Spouse name: Not on file  ? Number of children: 2  ? Years of education: Not on file  ? Highest education level: Not on file  ?Occupational History  ? Occupation: Oceanographer  ?Tobacco Use  ? Smoking status: Every Day   ?  Packs/day: 1.00  ?  Years: 28.00  ?  Pack years: 28.00  ?  Types: Cigarettes  ? Smokeless tobacco: Never  ?Vaping Use  ? Vaping Use: Never used  ?Substance and Sexual Activity  ? Alcohol use: No  ? Drug use: No  ? Sexual activity: Yes  ?  Partners: Male  ?  Birth control/protection: Injection  ?Other Topics Concern  ? Not on file  ?Social History Narrative  ? Not on file  ? ?Social Determinants of Health  ? ?Financial Resource Strain: Not on file  ?Food Insecurity: Not on file  ?Transportation Needs: Not on file  ?Physical Activity: Not on file  ?Stress: Not on file  ?Social Connections: Not on file  ? ? ?Family History  ?Problem Relation Age of Onset  ? Hypertension Maternal Grandmother   ? Heart attack Maternal Grandmother   ? Hypertension Maternal Grandfather   ? Heart attack Maternal Grandfather   ? Hypertension Paternal Grandmother   ? Heart attack Paternal Grandmother   ? Stroke Paternal Grandmother   ? Hypertension Paternal Grandfather   ? Heart attack Paternal Grandfather   ? Diabetes Maternal Uncle   ? Stroke Maternal Aunt   ? Breast cancer Neg Hx   ?     Mothers family  ? Cancer Neg Hx   ?     Fathers family  ? ? ?Health Maintenance  ?Topic Date Due  ? Hepatitis C Screening  Never done  ?  INFLUENZA VACCINE  11/09/2021 (Originally 03/12/2021)  ? MAMMOGRAM  10/26/2022 (Originally 07/21/2021)  ? TETANUS/TDAP  10/26/2022 (Originally 06/26/1997)  ? PAP SMEAR-Modifier  07/29/2022  ? HIV Screening  Completed  ? HPV VACCINES  Aged Out  ? COVID-19 Vaccine  Discontinued  ? ? ? ?----------------------------------------------------------------------------------------------------------------------------------------------------------------------------------------------------------------- ?Physical Exam ?BP 118/80 (BP Location: Left Arm, Patient Position: Sitting, Cuff Size: Normal)   Pulse (!) 105   Ht 5' 3"  (1.6 m)   Wt 141 lb (64 kg)   SpO2 96%   BMI 24.98 kg/m?  ? ?Physical Exam ?Constitutional:   ?    Appearance: Normal appearance.  ?HENT:  ?   Head: Normocephalic and atraumatic.  ?Eyes:  ?   General: No scleral icterus. ?Cardiovascular:  ?   Rate and Rhythm: Normal rate and regular rhythm.  ?Pulmonary:  ?   Effort: Pulmonary effort is normal.  ?   Breath sounds: Normal breath sounds.  ?Musculoskeletal:  ?   Cervical back: Neck supple.  ?Neurological:  ?   Mental Status: She is alert.  ?   Comments: Mild weakness of the right lower extremity.  Gait instability, using cane.  ?Psychiatric:     ?   Mood and Affect: Mood normal.     ?   Behavior: Behavior normal.  ? ? ?------------------------------------------------------------------------------------------------------------------------------------------------------------------------------------------------------------------- ?Assessment and Plan ? ?History of cerebellar stroke ?Recent mission for cerebellar CVA.  She will continue aspirin as well as statin.  Reminded to call and schedule follow-up with neurology.  Hypercoagulability work-up has been negative.  Adding 14-day Zio patch.  I do not think that Depo injections would have a strong association with her stroke.  I do think she needs to remain quit from smoking. ? ?Colitis ?She will complete course of cefdinir and Flagyl. ? ? ?No orders of the defined types were placed in this encounter. ? ? ?Return in about 3 months (around 01/25/2022) for F/u CVA. ? ? ? ?This visit occurred during the SARS-CoV-2 public health emergency.  Safety protocols were in place, including screening questions prior to the visit, additional usage of staff PPE, and extensive cleaning of exam room while observing appropriate contact time as indicated for disinfecting solutions.  ? ?

## 2021-10-25 NOTE — Assessment & Plan Note (Signed)
She will complete course of cefdinir and Flagyl. ?

## 2021-10-26 ENCOUNTER — Telehealth: Payer: Self-pay | Admitting: Hematology and Oncology

## 2021-10-26 LAB — FACTOR 5 LEIDEN

## 2021-10-26 NOTE — Telephone Encounter (Signed)
Scheduled appt per 3/16 referral. Pt is aware of appt date and time. Pt is aware to arrive 15 mins prior to appt time and to bring and updated insurance card. Pt is aware of appt location.   ?

## 2021-10-29 ENCOUNTER — Encounter: Payer: Self-pay | Admitting: Family Medicine

## 2021-10-29 DIAGNOSIS — I480 Paroxysmal atrial fibrillation: Secondary | ICD-10-CM

## 2021-10-29 DIAGNOSIS — I4510 Unspecified right bundle-branch block: Secondary | ICD-10-CM

## 2021-10-29 DIAGNOSIS — R000 Tachycardia, unspecified: Secondary | ICD-10-CM

## 2021-10-29 DIAGNOSIS — Z8673 Personal history of transient ischemic attack (TIA), and cerebral infarction without residual deficits: Secondary | ICD-10-CM

## 2021-10-29 DIAGNOSIS — I639 Cerebral infarction, unspecified: Secondary | ICD-10-CM

## 2021-10-30 NOTE — Telephone Encounter (Signed)
Not sure why the Depo was denied as I haven't seen any request regarding this.   It looks like she was getting this at GYN.  ? ?We'll be in touch when [facility] paperwork is ready.

## 2021-11-02 ENCOUNTER — Telehealth: Payer: Self-pay | Admitting: *Deleted

## 2021-11-02 NOTE — Telephone Encounter (Signed)
This RN received a VM from pt stating " this is the 4th person I have been transferred to- and I am just trying to find out how much I have to pay upfront when I come in to see Dr Chryl Heck on the 4th ( April ). ? ?This RN returned her call and informed her as a nurse I do not have the answer but could help her. ? ?She states she is un insured. ? ?This RN informed her - inquiry will be forwarded to [employer]. ? ?Of note pt's husband, Gerald Stabs, is a patient under Dr Alen Blew - and Judeen Hammans states she has spoken with her before and obtained financial help for him. ? ?This note will be forwarded for further assistance. ?

## 2021-11-08 ENCOUNTER — Telehealth: Payer: Self-pay

## 2021-11-08 NOTE — Telephone Encounter (Signed)
Rcvd vm from patient stating she left 2 MyChart messages but didn't received responses.  ? ?Returned pt's call. She's having continued stomach spasms and was wondering if it could be related to the stroke. She did admit to dry heaving that could have caused the stomach pain. ? ?Per Dr. Zigmund Daniel, spasms are probably not related to the stroke.  ? ?Pt was offered an appt for the stomach spasms or to wait a few days and see if they subside. She prefers to wait a few days. ?

## 2021-11-09 ENCOUNTER — Encounter: Payer: Self-pay | Admitting: Family Medicine

## 2021-11-12 ENCOUNTER — Encounter: Payer: Self-pay | Admitting: Hematology and Oncology

## 2021-11-12 ENCOUNTER — Other Ambulatory Visit: Payer: Self-pay

## 2021-11-12 ENCOUNTER — Inpatient Hospital Stay: Payer: Self-pay | Attending: Hematology and Oncology

## 2021-11-12 ENCOUNTER — Inpatient Hospital Stay (HOSPITAL_BASED_OUTPATIENT_CLINIC_OR_DEPARTMENT_OTHER): Payer: Self-pay | Admitting: Hematology and Oncology

## 2021-11-12 VITALS — BP 126/86 | HR 96 | Temp 97.5°F | Resp 17 | Wt 145.5 lb

## 2021-11-12 DIAGNOSIS — Z8673 Personal history of transient ischemic attack (TIA), and cerebral infarction without residual deficits: Secondary | ICD-10-CM | POA: Insufficient documentation

## 2021-11-12 DIAGNOSIS — F17210 Nicotine dependence, cigarettes, uncomplicated: Secondary | ICD-10-CM | POA: Insufficient documentation

## 2021-11-12 DIAGNOSIS — Z823 Family history of stroke: Secondary | ICD-10-CM

## 2021-11-12 DIAGNOSIS — Z8249 Family history of ischemic heart disease and other diseases of the circulatory system: Secondary | ICD-10-CM

## 2021-11-12 DIAGNOSIS — Z79899 Other long term (current) drug therapy: Secondary | ICD-10-CM | POA: Insufficient documentation

## 2021-11-12 LAB — CBC WITH DIFFERENTIAL/PLATELET
Abs Immature Granulocytes: 0.04 10*3/uL (ref 0.00–0.07)
Basophils Absolute: 0.1 10*3/uL (ref 0.0–0.1)
Basophils Relative: 1 %
Eosinophils Absolute: 0.3 10*3/uL (ref 0.0–0.5)
Eosinophils Relative: 2 %
HCT: 44.4 % (ref 36.0–46.0)
Hemoglobin: 14.6 g/dL (ref 12.0–15.0)
Immature Granulocytes: 0 %
Lymphocytes Relative: 26 %
Lymphs Abs: 2.8 10*3/uL (ref 0.7–4.0)
MCH: 30.5 pg (ref 26.0–34.0)
MCHC: 32.9 g/dL (ref 30.0–36.0)
MCV: 92.7 fL (ref 80.0–100.0)
Monocytes Absolute: 0.7 10*3/uL (ref 0.1–1.0)
Monocytes Relative: 7 %
Neutro Abs: 6.8 10*3/uL (ref 1.7–7.7)
Neutrophils Relative %: 64 %
Platelets: 321 10*3/uL (ref 150–400)
RBC: 4.79 MIL/uL (ref 3.87–5.11)
RDW: 12.6 % (ref 11.5–15.5)
WBC: 10.7 10*3/uL — ABNORMAL HIGH (ref 4.0–10.5)
nRBC: 0 % (ref 0.0–0.2)

## 2021-11-12 LAB — ANTITHROMBIN III: AntiThromb III Func: 115 % (ref 75–120)

## 2021-11-12 NOTE — Progress Notes (Signed)
[place] ?CONSULT NOTE ? ?Patient Care Team: ?Luetta Nutting, DO as PCP - General (Family Medicine) ?Silverio Decamp, MD as Consulting Physician (Sports Medicine) ? ?CHIEF COMPLAINTS/PURPOSE OF CONSULTATION:  ?Acute stroke ? ?ASSESSMENT & PLAN:  ?History of cerebellar stroke ?This is a very pleasant 46 year old female patient with no past medical history of DVT/PE recently admitted with acute cerebellar stroke when she presented with dizziness, nausea and vomiting.  Her risk factors include active smoking, hyperlipidemia at that time. ?She had hypercoagulable work-up done in the hospital which did not show any evidence of hypercoagulable disorders.  Antithrombin deficiency was not checked at the time of the hospital visit. ?She denies any prior miscarriages, family history of blood clotting disorder. ?We have discussed that we could also consider checking her for JAK2 mutation, Antithrombin III levels today.  However given the cost she wants to forego the JAK2 mutation at this time.  She will continue to take aspirin and lipid-lowering agent as prescribed.  If Antithrombin levels are normal, then there is no clear explanation from the hypercoagulable standpoint as to why she had this acute ischemic stroke.  It could very well be from her active smoking history as well as hyperlipidemia.  We have discussed about smoking cessation.  She should avoid any form of estrogen-based contraception in the future. ? ?She should continue follow-up with her PCP, aspirin and lipid-lowering agents.  She should consider follow-up with neurology. ?Return to clinic with hematology as needed. ? ?Orders Placed This Encounter  ?Procedures  ? CBC with Differential/Platelet  ?  Standing Status:   Standing  ?  Number of Occurrences:   64  ?  Standing Expiration Date:   11/13/2022  ? Antithrombin III*  ?  Standing Status:   Future  ?  Standing Expiration Date:   11/12/2022  ? ? ? ?HISTORY OF PRESENTING ILLNESS:  ?ARDEAN SIMONICH 46 y.o. female is here because of recent cerebellar stroke. ? ?This is a very pleasant 46 year old female patient with past medical history significant for prediabetes, hyperlipidemia, smoking referred to hematology for evaluation of hypercoagulable disorders given her recent stroke.  Patient presented to the hospital in early March 2023 with chief complaint of dizziness, nausea and vomiting and was found to have an acute cerebellar stroke, hypercoagulable work-up done in the hospital without any concerns, referred to hematology for any additional recommendations.  She was on progesterone only contraception at the time of the stroke.  She denies taking any estrogen-based medication.  She denies any prior miscarriages.  No other events concerning for DVT/PE in the past.  No family history of blood clots however cardiac events run in the family's.  Many family members had heart attacks and strokes.  She denies any recent COVID-19 infection. ?She had hypercoagulable work-up including factor V Leiden, prothrombin gene mutation, cardiolipin antibodies, beta-2 glycoprotein antibodies, lupus anticoagulant panel which were all unremarkable.  SPEP unremarkable.  No evidence of elevated ANCA titers or complement levels.  Protein S total, activity, protein C total, activity is also unremarkable.  No evidence of hypothyroidism. ? ?She continues to deal with some balance issues and leans more towards the right when she walks but she is working with physical therapy and has been steadily seeing an improvement.  She does not have very much nausea or dizziness anymore except when she moves very quickly. ? ?Rest of the pertinent 10 point ROS reviewed and negative. ? ?MEDICAL HISTORY:  ?Past Medical History:  ?Diagnosis Date  ?  DDD (degenerative disc disease), cervical 2016  ? Dr. Karie Schwalbe  ? Gestational diabetes   ? ? ?SURGICAL HISTORY: ?History reviewed. No pertinent surgical history. ? ?SOCIAL HISTORY: ?Social History   ? ?Socioeconomic History  ? Marital status: Married  ?  Spouse name: Not on file  ? Number of children: 2  ? Years of education: Not on file  ? Highest education level: Not on file  ?Occupational History  ? Occupation: Oceanographer  ?Tobacco Use  ? Smoking status: Every Day  ?  Packs/day: 1.00  ?  Years: 28.00  ?  Pack years: 28.00  ?  Types: Cigarettes  ? Smokeless tobacco: Never  ?Vaping Use  ? Vaping Use: Never used  ?Substance and Sexual Activity  ? Alcohol use: No  ? Drug use: No  ? Sexual activity: Yes  ?  Partners: Male  ?  Birth control/protection: Injection  ?Other Topics Concern  ? Not on file  ?Social History Narrative  ? Not on file  ? ?Social Determinants of Health  ? ?Financial Resource Strain: Not on file  ?Food Insecurity: Not on file  ?Transportation Needs: Not on file  ?Physical Activity: Not on file  ?Stress: Not on file  ?Social Connections: Not on file  ?Intimate Partner Violence: Not on file  ? ? ?FAMILY HISTORY: ?Family History  ?Problem Relation Age of Onset  ? Hypertension Maternal Grandmother   ? Heart attack Maternal Grandmother   ? Hypertension Maternal Grandfather   ? Heart attack Maternal Grandfather   ? Hypertension Paternal Grandmother   ? Heart attack Paternal Grandmother   ? Stroke Paternal Grandmother   ? Hypertension Paternal Grandfather   ? Heart attack Paternal Grandfather   ? Diabetes Maternal Uncle   ? Stroke Maternal Aunt   ? Breast cancer Neg Hx   ?     Mothers family  ? Cancer Neg Hx   ?     Fathers family  ? ? ?ALLERGIES:  has No Known Allergies. ? ?MEDICATIONS:  ?Current Outpatient Medications  ?Medication Sig Dispense Refill  ? acetaminophen (TYLENOL) 325 MG tablet Take 2 tablets (650 mg total) by mouth every 6 (six) hours as needed for mild pain, fever or headache (or Fever >/= 101). 12 tablet 0  ? albuterol (VENTOLIN HFA) 108 (90 Base) MCG/ACT inhaler Inhale 2 puffs into the lungs every 6 (six) hours as needed for wheezing or shortness of breath. 18 g 3  ?  amitriptyline (ELAVIL) 25 MG tablet Take 1 tab PO qhs 90 tablet 2  ? aspirin EC 81 MG EC tablet Take 1 tablet (81 mg total) by mouth daily with breakfast. Swallow whole. 30 tablet 11  ? atorvastatin (LIPITOR) 40 MG tablet Take 1 tablet (40 mg total) by mouth daily. 30 tablet 11  ? meclizine (ANTIVERT) 25 MG tablet Take 1 tablet (25 mg total) by mouth 3 (three) times daily as needed for dizziness or nausea (Vertigo). 30 tablet 0  ? ondansetron (ZOFRAN ODT) 4 MG disintegrating tablet Take 1 tablet (4 mg total) by mouth every 6 (six) hours as needed for nausea or vomiting. 20 tablet 1  ? ondansetron (ZOFRAN) 4 MG tablet Take 1 tablet (4 mg total) by mouth every 6 (six) hours as needed for nausea. 20 tablet 0  ? zolpidem (AMBIEN) 10 MG tablet TAKE 1/2-1 TABLET BY MOUTH EVERY DAY AT BEDTIME AS NEEDED FOR SLEEP 90 tablet 1  ? ?No current facility-administered medications for this visit.  ? ? ? ?PHYSICAL  EXAMINATION: ?ECOG PERFORMANCE STATUS: 0 - Asymptomatic ? ?Vitals:  ? 11/12/21 0950  ?BP: 126/86  ?Pulse: 96  ?Resp: 17  ?Temp: (!) 97.5 ?F (36.4 ?C)  ?SpO2: 100%  ? ?Filed Weights  ? 11/12/21 0950  ?Weight: 145 lb 8 oz (66 kg)  ? ?Physical Exam ?Constitutional:   ?   Appearance: Normal appearance.  ?Cardiovascular:  ?   Rate and Rhythm: Normal rate and regular rhythm.  ?   Pulses: Normal pulses.  ?   Heart sounds: Normal heart sounds.  ?Pulmonary:  ?   Effort: Pulmonary effort is normal.  ?   Breath sounds: Normal breath sounds.  ?Abdominal:  ?   General: Abdomen is flat. Bowel sounds are normal.  ?Musculoskeletal:  ?   Cervical back: Normal range of motion and neck supple. No rigidity.  ?Lymphadenopathy:  ?   Cervical: No cervical adenopathy.  ?Skin: ?   General: Skin is warm and dry.  ?Neurological:  ?   General: No focal deficit present.  ?   Mental Status: She is alert.  ? ? ?LABORATORY DATA:  ?I have reviewed the data as listed ?Lab Results  ?Component Value Date  ? WBC 17.2 (H) 10/19/2021  ? HGB 11.8 (L) 10/19/2021   ? HCT 36.1 10/19/2021  ? MCV 93.5 10/19/2021  ? PLT 329 10/19/2021  ? ?  Chemistry   ?   ?Component Value Date/Time  ? NA 140 10/19/2021 0456  ? K 3.4 (L) 10/19/2021 0456  ? CL 111 10/19/2021 0456  ? CO2 22 03

## 2021-11-12 NOTE — Assessment & Plan Note (Signed)
This is a very pleasant 46 year old female patient with no past medical history of DVT/PE recently admitted with acute cerebellar stroke when she presented with dizziness, nausea and vomiting.  Her risk factors include active smoking, hyperlipidemia at that time. ?She had hypercoagulable work-up done in the hospital which did not show any evidence of hypercoagulable disorders.  Antithrombin deficiency was not checked at the time of the hospital visit. ?She denies any prior miscarriages, family history of blood clotting disorder. ?We have discussed that we could also consider checking her for JAK2 mutation, Antithrombin III levels today.  However given the cost she wants to forego the JAK2 mutation at this time.  She will continue to take aspirin and lipid-lowering agent as prescribed.  If Antithrombin levels are normal, then there is no clear explanation from the hypercoagulable standpoint as to why she had this acute ischemic stroke.  It could very well be from her active smoking history as well as hyperlipidemia.  We have discussed about smoking cessation.  She should avoid any form of estrogen-based contraception in the future. ? ?She should continue follow-up with her PCP, aspirin and lipid-lowering agents.  She should consider follow-up with neurology. ?Return to clinic with hematology as needed. ?

## 2021-11-23 ENCOUNTER — Encounter: Payer: Self-pay | Admitting: Family Medicine

## 2021-11-23 ENCOUNTER — Other Ambulatory Visit: Payer: Self-pay | Admitting: Family Medicine

## 2021-11-23 MED ORDER — METOPROLOL TARTRATE 25 MG PO TABS: 25.0000 mg | ORAL_TABLET | Freq: Two times a day (BID) | ORAL | 3 refills | Status: DC

## 2021-12-06 ENCOUNTER — Encounter: Payer: Self-pay | Admitting: Family Medicine

## 2021-12-07 ENCOUNTER — Other Ambulatory Visit: Payer: Self-pay | Admitting: Family Medicine

## 2021-12-07 MED ORDER — ROSUVASTATIN CALCIUM 10 MG PO TABS: 10.0000 mg | ORAL_TABLET | Freq: Every day | ORAL | 3 refills | Status: DC

## 2021-12-07 MED ORDER — DICYCLOMINE HCL 10 MG PO CAPS: 10.0000 mg | ORAL_CAPSULE | Freq: Three times a day (TID) | ORAL | 0 refills | Status: DC

## 2021-12-13 ENCOUNTER — Other Ambulatory Visit: Payer: Self-pay | Admitting: Family Medicine

## 2021-12-13 DIAGNOSIS — F5101 Primary insomnia: Secondary | ICD-10-CM

## 2021-12-13 NOTE — Telephone Encounter (Signed)
Last written 04/13/2021#90 with 1 refill ?Last appt 10/25/2021 ?

## 2021-12-21 ENCOUNTER — Encounter: Payer: Self-pay | Admitting: Family Medicine

## 2021-12-21 DIAGNOSIS — Z8673 Personal history of transient ischemic attack (TIA), and cerebral infarction without residual deficits: Secondary | ICD-10-CM

## 2021-12-28 ENCOUNTER — Other Ambulatory Visit: Payer: Self-pay | Admitting: Family Medicine

## 2022-01-15 ENCOUNTER — Other Ambulatory Visit: Payer: Self-pay | Admitting: Family Medicine

## 2022-01-25 ENCOUNTER — Encounter: Payer: Self-pay | Admitting: Family Medicine

## 2022-01-25 ENCOUNTER — Ambulatory Visit (INDEPENDENT_AMBULATORY_CARE_PROVIDER_SITE_OTHER): Payer: Self-pay | Admitting: Family Medicine

## 2022-01-25 VITALS — BP 114/75 | HR 84 | Ht 63.0 in | Wt 156.0 lb

## 2022-01-25 DIAGNOSIS — R5383 Other fatigue: Secondary | ICD-10-CM

## 2022-01-25 DIAGNOSIS — G43819 Other migraine, intractable, without status migrainosus: Secondary | ICD-10-CM

## 2022-01-25 DIAGNOSIS — L659 Nonscarring hair loss, unspecified: Secondary | ICD-10-CM

## 2022-01-25 DIAGNOSIS — H60501 Unspecified acute noninfective otitis externa, right ear: Secondary | ICD-10-CM

## 2022-01-25 DIAGNOSIS — Z8673 Personal history of transient ischemic attack (TIA), and cerebral infarction without residual deficits: Secondary | ICD-10-CM

## 2022-01-25 DIAGNOSIS — E785 Hyperlipidemia, unspecified: Secondary | ICD-10-CM

## 2022-01-25 MED ORDER — NEOMYCIN-POLYMYXIN-HC 3.5-10000-1 OT SOLN: 3.0000 [drp] | Freq: Four times a day (QID) | OTIC | 0 refills | Status: DC

## 2022-01-26 LAB — IRON,TIBC AND FERRITIN PANEL
%SAT: 18 % (calc) (ref 16–45)
Ferritin: 70 ng/mL (ref 16–232)
Iron: 63 ug/dL (ref 40–190)
TIBC: 350 mcg/dL (calc) (ref 250–450)

## 2022-01-26 LAB — TSH+FREE T4: TSH W/REFLEX TO FT4: 0.92 mIU/L

## 2022-01-26 LAB — COMPLETE METABOLIC PANEL WITH GFR
AST: 19 U/L (ref 10–30)
Alkaline phosphatase (APISO): 82 U/L (ref 31–125)
Chloride: 108 mmol/L (ref 98–110)
Creat: 0.78 mg/dL (ref 0.50–0.99)
Globulin: 2.5 g/dL (calc) (ref 1.9–3.7)
Potassium: 4.6 mmol/L (ref 3.5–5.3)
Sodium: 140 mmol/L (ref 135–146)
Total Bilirubin: 0.3 mg/dL (ref 0.2–1.2)
Total Protein: 6.5 g/dL (ref 6.1–8.1)
eGFR: 97 mL/min/{1.73_m2} (ref >=60)

## 2022-01-26 LAB — VITAMIN B12: Vitamin B-12: 367 pg/mL (ref 200–1100)

## 2022-01-26 LAB — LIPID PANEL W/REFLEX DIRECT LDL
Cholesterol: 118 mg/dL (ref <200)
HDL: 34 mg/dL — ABNORMAL LOW (ref >=50)
LDL Cholesterol (Calc): 58 mg/dL (calc)
Non-HDL Cholesterol (Calc): 84 mg/dL (calc) (ref <130)
Total CHOL/HDL Ratio: 3.5 (calc) (ref <5.0)
Triglycerides: 183 mg/dL — ABNORMAL HIGH (ref <150)

## 2022-01-27 DIAGNOSIS — L659 Nonscarring hair loss, unspecified: Secondary | ICD-10-CM

## 2022-01-27 DIAGNOSIS — H6090 Unspecified otitis externa, unspecified ear: Secondary | ICD-10-CM | POA: Insufficient documentation

## 2022-01-27 DIAGNOSIS — R5383 Other fatigue: Secondary | ICD-10-CM | POA: Insufficient documentation

## 2022-01-27 HISTORY — DX: Unspecified otitis externa, unspecified ear: H60.90

## 2022-01-27 HISTORY — DX: Nonscarring hair loss, unspecified: L65.9

## 2022-01-27 HISTORY — DX: Other fatigue: R53.83

## 2022-01-27 NOTE — Assessment & Plan Note (Signed)
She remains on Elavil.  Her migraines have improved.

## 2022-01-27 NOTE — Assessment & Plan Note (Signed)
Treating with Cortisporin

## 2022-01-27 NOTE — Assessment & Plan Note (Signed)
She continues to have some mild right upper extremity weakness.  She will continue home exercises prescribed by physical therapy.  Continue aspirin and rosuvastatin.  Updating lipid panel.  Referral placed to neurology as she has not had any follow-up with them.

## 2022-01-27 NOTE — Assessment & Plan Note (Signed)
She is having fatigue and hair loss.  I do not think this is related to any medication she is currently taking.  Updating labs today.

## 2022-01-27 NOTE — Progress Notes (Signed)
Nancy Cochran - 46 y.o. female MRN [id number]  Date of birth: [birth year]  Subjective Chief Complaint  Patient presents with   Follow-up    HPI Nancy Cochran is a 46 year old female here today for follow-up visit.  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  She reports she is doing okay today.  Continues to have some right upper extremity deficit and mild dysarthria.  She was doing physical therapy however could not continue this due to continued cost and being uninsured.  She has not had any follow-up with neurology and would be interested in this.  She does report that her migraines seem to be better.  She remains on amitriptyline.  Additionally she was unable to tolerate atorvastatin due to muscle cramps and myalgias.  So far she is tolerating rosuvastatin well.  She reports she has had some fatigue and hair loss.  Felt like she should have bounced back more from her stroke at this point.  She is having some drainage from her right ear.  She was seen by ENT previously due to continued vertigo.  No middle ear effusion noted at that time.  ROS:  A comprehensive ROS was completed and negative except as noted per HPI   No Known Allergies  Past Medical History:  Diagnosis Date   DDD (degenerative disc disease), cervical 2016   Dr. Karie Schwalbe   Gestational diabetes     History reviewed. No pertinent surgical history.  Social History   Socioeconomic History   Marital status: Married    Spouse name: Not on file   Number of children: 2   Years of education: Not on file   Highest education level: Not on file  Occupational History   Occupation: Oceanographer  Tobacco Use   Smoking status: Every Day    Packs/day: 1.00    Years: 28.00    Total pack years: 28.00    Types: Cigarettes   Smokeless tobacco: Never  Vaping Use   Vaping Use: Never used  Substance and Sexual Activity   Alcohol use: No   Drug use: No   Sexual activity: Yes    Partners: Male    Birth  control/protection: Injection  Other Topics Concern   Not on file  Social History Narrative   Not on file   Social Determinants of Health   Financial Resource Strain: Not on file  Food Insecurity: Not on file  Transportation Needs: Not on file  Physical Activity: Not on file  Stress: Not on file  Social Connections: Not on file    Family History  Problem Relation Age of Onset   Hypertension Maternal Grandmother    Heart attack Maternal Grandmother    Hypertension Maternal Grandfather    Heart attack Maternal Grandfather    Hypertension Paternal Grandmother    Heart attack Paternal Grandmother    Stroke Paternal Grandmother    Hypertension Paternal Grandfather    Heart attack Paternal Grandfather    Diabetes Maternal Uncle    Stroke Maternal Aunt    Breast cancer Neg Hx        Mothers family   Cancer Neg Hx        Fathers family    Health Maintenance  Topic Date Due   TETANUS/TDAP  10/26/2022 (Originally 06/26/1997)   Hepatitis C Screening  01/26/2023 (Originally 06/26/1996)   INFLUENZA VACCINE  03/12/2022   PAP SMEAR-Modifier  07/29/2022   MAMMOGRAM  10/26/2023   HIV Screening  Completed   HPV  VACCINES  Aged Out   COVID-19 Vaccine  Discontinued     ----------------------------------------------------------------------------------------------------------------------------------------------------------------------------------------------------------------- Physical Exam BP 114/75 (BP Location: Right Arm, Patient Position: Sitting, Cuff Size: Normal)   Pulse 84   Ht 5' 3"  (1.6 m)   Wt 156 lb (70.8 kg)   SpO2 99%   BMI 27.63 kg/m   Physical Exam Constitutional:      Appearance: Normal appearance.  HENT:     Right Ear: Tympanic membrane normal.     Left Ear: Tympanic membrane and ear canal normal.     Ears:     Comments: Right canal with erythema and drainage.  TM without effusion Eyes:     General: No scleral icterus. Cardiovascular:     Rate and  Rhythm: Normal rate and regular rhythm.  Pulmonary:     Effort: Pulmonary effort is normal.     Breath sounds: Normal breath sounds.  Musculoskeletal:     Cervical back: Neck supple.  Neurological:     Mental Status: She is alert.     Comments: Right arm weakness with mild dysarthria.  Psychiatric:        Mood and Affect: Mood normal.        Behavior: Behavior normal.     ------------------------------------------------------------------------------------------------------------------------------------------------------------------------------------------------------------------- Assessment and Plan  History of cerebellar stroke She continues to have some mild right upper extremity weakness.  She will continue home exercises prescribed by physical therapy.  Continue aspirin and rosuvastatin.  Updating lipid panel.  Referral placed to neurology as she has not had any follow-up with them.  Migraines She remains on Elavil.  Her migraines have improved.  Other fatigue She is having fatigue and hair loss.  I do not think this is related to any medication she is currently taking.  Updating labs today.  Otitis externa Treating with Cortisporin   Meds ordered this encounter  Medications   neomycin-polymyxin-hydrocortisone (CORTISPORIN) OTIC solution    Sig: Place 3 drops into both ears 4 (four) times daily.    Dispense:  10 mL    Refill:  0    Return in about 6 months (around 07/27/2022) for Stroke f/u.    This visit occurred during the SARS-CoV-2 public health emergency.  Safety protocols were in place, including screening questions prior to the visit, additional usage of staff PPE, and extensive cleaning of exam room while observing appropriate contact time as indicated for disinfecting solutions.

## 2022-02-05 ENCOUNTER — Encounter: Payer: Self-pay | Admitting: Family Medicine

## 2022-02-12 NOTE — Telephone Encounter (Signed)
Please route referral to [facility] neuro rather than [facility].  Sounds like she does not want to see her previous neurologist.  Thanks!

## 2022-02-18 ENCOUNTER — Other Ambulatory Visit: Payer: Self-pay | Admitting: Family Medicine

## 2022-02-18 DIAGNOSIS — Z8673 Personal history of transient ischemic attack (TIA), and cerebral infarction without residual deficits: Secondary | ICD-10-CM

## 2022-02-18 DIAGNOSIS — R42 Dizziness and giddiness: Secondary | ICD-10-CM

## 2022-02-20 ENCOUNTER — Encounter: Payer: Self-pay | Admitting: Family Medicine

## 2022-02-28 ENCOUNTER — Other Ambulatory Visit: Payer: Self-pay | Admitting: Family Medicine

## 2022-02-28 DIAGNOSIS — Z8673 Personal history of transient ischemic attack (TIA), and cerebral infarction without residual deficits: Secondary | ICD-10-CM

## 2022-02-28 DIAGNOSIS — R42 Dizziness and giddiness: Secondary | ICD-10-CM

## 2022-03-05 ENCOUNTER — Encounter: Payer: Self-pay | Admitting: Neurology

## 2022-03-13 ENCOUNTER — Encounter: Payer: Self-pay | Admitting: Family Medicine

## 2022-03-25 MED ORDER — BACLOFEN 10 MG PO TABS: 10.0000 mg | ORAL_TABLET | Freq: Three times a day (TID) | ORAL | 0 refills | Status: DC

## 2022-04-22 ENCOUNTER — Inpatient Hospital Stay: Payer: Self-pay | Attending: Licensed Clinical Social Worker | Admitting: Licensed Clinical Social Worker

## 2022-04-22 NOTE — Progress Notes (Signed)
[place] Social Work  Patient presented to [facility]  to review and complete healthcare advance directives.  Clinical Social Worker met with patient and husband, Gerald Stabs.  The patient designated Pepper Kerrick (husband) as their primary healthcare agent and did not assign a secondary agent.  Patient also completed healthcare living will.    Documents were notarized and copies made for patient/family. Clinical Social Worker will send documents to medical records to be scanned into patient's chart. Clinical Social Worker encouraged patient/family to contact with any additional questions or concerns.   [place], [place] Worker [employer]

## 2022-05-22 ENCOUNTER — Other Ambulatory Visit: Payer: Self-pay | Admitting: Family Medicine

## 2022-05-22 DIAGNOSIS — F5101 Primary insomnia: Secondary | ICD-10-CM

## 2022-06-10 NOTE — Progress Notes (Unsigned)
Initial neurology clinic note  Nancy Cochran MRN: [id number] DOB: 02-Oct-1975  Referring provider: Luetta Nutting, DO  Primary care provider: Luetta Nutting, DO  Reason for consult:  stroke follow up  Subjective:  This is Nancy Cochran, a 46 y.o. right-handed female with a medical history of DM, migraines, chronic low back pain, tobacco use, anxiety who presents to neurology clinic for post stroke follow up. The patient is accompanied by husband.  Patient presented to the ED on 10/18/21 for vertigo, nausea, and vomiting. Patient was found to have colitis by CT abdomen and pelvis, but work up for stroke was also started. 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 She had dysarthria and right sided weakness (arm and leg) as well. Patient is not sure if this was present initially, but it was definitely noted when she returned home.   She had hypercoagulable work-up including factor V Leiden, prothrombin gene mutation, cardiolipin antibodies, beta-2 glycoprotein antibodies, lupus anticoagulant panel which were all unremarkable.  SPEP unremarkable.  No evidence of elevated ANCA titers or complement levels.  Protein S total, activity, protein C total, activity is also unremarkable.  No evidence of hypothyroidism. Cholesterol was elevated, so statin was started. She was also started on asa 81 mg. HbA1c was 5.2, TSH 0.845. Telemetry showed no arrhythmias. Echo showed no significant abnormalities. Zio patch was ordered for outpatient. Patient improved and was discharged with outpatient PT. She has not done speech therapy (was referred but could not find someone that would take her).  Patient was initially seen at [facility] in 12/2021 for post stroke care. Per their documentation, patient had aphasia and right sided weakness. Their documentation implicates depo birth control shot and smoking as likely causes of her stroke. Per patient, [facility] told her to take 2 asa 86m  daily, so she has been. She also stopped the Crestor 10 mg due to price increase (patient does not have insurance). Patient is still taking the depo birth control shot. She quit smoking after her stroke.  Current symptoms: -Has mild dysarthria -Thumping in right ear, which she has seen ENT and PCP without cause. It has improved but still there. -Right sided weakness, improving but still weak -Occasional dizziness, especially with quick movements -Muscle spasms on right face, arm, and stomach -Feels like her heart races more often -Takes amitriptyline for migraines (having 4-5 per month); takes tylenol for abortive but does not help much  MEDICATIONS:  Outpatient Encounter Medications as of 06/12/2022  Medication Sig   acetaminophen (TYLENOL) 325 MG tablet Take 2 tablets (650 mg total) by mouth every 6 (six) hours as needed for mild pain, fever or headache (or Fever >/= 101).   albuterol (VENTOLIN HFA) 108 (90 Base) MCG/ACT inhaler Inhale 2 puffs into the lungs every 6 (six) hours as needed for wheezing or shortness of breath.   amitriptyline (ELAVIL) 25 MG tablet TAKE 1 TABLET BY MOUTH EVERYDAY AT BEDTIME   aspirin EC 81 MG EC tablet Take 1 tablet (81 mg total) by mouth daily with breakfast. Swallow whole. (Patient taking differently: Take 162 mg by mouth daily with breakfast. Swallow whole.)   metoprolol tartrate (LOPRESSOR) 25 MG tablet Take 1 tablet (25 mg total) by mouth 2 (two) times daily.   Multiple Vitamin (MULTIVITAMIN) capsule Take 1 capsule by mouth daily.   zolpidem (AMBIEN) 10 MG tablet TAKE 1/2-1 TABLET BY MOUTH EVERY DAY AT BEDTIME AS NEEDED FOR SLEEP   baclofen (LIORESAL) 10 MG tablet Take  1 tablet (10 mg total) by mouth 3 (three) times daily. (Patient not taking: Reported on 06/12/2022)   meclizine (ANTIVERT) 25 MG tablet Take 1 tablet (25 mg total) by mouth 3 (three) times daily as needed for dizziness or nausea (Vertigo). (Patient not taking: Reported on 06/12/2022)    neomycin-polymyxin-hydrocortisone (CORTISPORIN) OTIC solution Place 3 drops into both ears 4 (four) times daily. (Patient not taking: Reported on 06/12/2022)   rosuvastatin (CRESTOR) 10 MG tablet Take 1 tablet (10 mg total) by mouth daily. (Patient not taking: Reported on 06/12/2022)   No facility-administered encounter medications on file as of 06/12/2022.    PAST MEDICAL HISTORY: Past Medical History:  Diagnosis Date   DDD (degenerative disc disease), cervical 2016   Dr. Karie Schwalbe   Gestational diabetes     PAST SURGICAL HISTORY: History reviewed. No pertinent surgical history.  ALLERGIES: No Known Allergies  FAMILY HISTORY: Family History  Problem Relation Age of Onset   Heart disease Father    Hypertension Maternal Grandmother    Heart attack Maternal Grandmother    Hypertension Maternal Grandfather    Heart attack Maternal Grandfather    Hypertension Paternal Grandmother    Heart attack Paternal Grandmother    Stroke Paternal Grandmother    Hypertension Paternal Grandfather    Heart attack Paternal Grandfather    Stroke Maternal Aunt    Diabetes Maternal Uncle    Breast cancer Neg Hx        Mothers family   Cancer Neg Hx        Fathers family    SOCIAL HISTORY: Social History   Tobacco Use   Smoking status: Every Day    Packs/day: 1.00    Years: 28.00    Total pack years: 28.00    Types: Cigarettes   Smokeless tobacco: Never  Vaping Use   Vaping Use: Never used  Substance Use Topics   Alcohol use: No   Drug use: No   Social History   Social History Narrative   Are you right handed or left handed? Right handed   Are you currently employed ? No       Do you live at home alone? Live with husband and Daughter      What type of home do you live in: 1 story or 2 story? One story   Caffeine 3-4 sodas a day        Objective:  Vital Signs:  BP 118/79   Pulse 94   Ht 5' 4"  (1.626 m)   Wt 162 lb (73.5 kg)   SpO2 99%   BMI 27.81 kg/m    General: General appearance: Awake and alert. No distress. Cooperative with exam.  Skin: No obvious rash or jaundice. HEENT: Atraumatic. Anicteric. Lungs: Non-labored breathing on room air  Heart: Regular Abdomen: Soft, non tender. Extremities: No edema. No obvious deformity.  Musculoskeletal: No obvious joint swelling. Psych: Affect appropriate.  Neurological: Mental Status: Alert. Speech fluent. No pseudobulbar affect Cranial Nerves: CNII: No RAPD. Visual fields intact. CNIII, IV, VI: PERRL. No nystagmus. EOMI. CN V: Facial sensation intact bilaterally to fine touch. CN VII: Facial muscles symmetric and strong. No ptosis at rest. CN VIII: Hears finger rub well bilaterally. CN IX: No hypophonia. CN X: Palate elevates symmetrically. CN XI: Full strength shoulder shrug bilaterally. CN XII: Tongue protrusion full and midline. No atrophy or fasciculations. Mild dysarthria Motor: Tone is normal.  Individual muscle group testing (MRC grade out of 5):  Movement  Neck flexion 5    Neck extension 5     Right Left   Shoulder abduction 5- 5   Shoulder adduction 5 5   Elbow flexion 5- 5   Elbow extension 5- 5   Finger abduction - FDI 5 5   Finger abduction - ADM 5 5   Finger extension 5 5   Finger distal flexion - 2/3 5 5    Finger distal flexion - 4/5 5 5    Thumb flexion - FPL 5 5   Thumb abduction - APB 5 5    Hip flexion 5 5   Hip extension 5 5   Hip adduction 5 5   Hip abduction 5 5   Knee extension 5 5   Knee flexion 5 5   Dorsiflexion 5 5   Plantarflexion 5 5     Reflexes:  Right Left   Bicep 2+ 2+   Tricep 2+ 2+   BrRad 2+ 2+   Knee 2+ 2+   Ankle 2+ 2+    Sensation: Pinprick: 50% of normal in right upper and lower extremities compared to left Coordination: Intact finger-to- nose-finger on left. Mild dysmetria on right. Romberg negative. Gait: Able to rise from chair with arms crossed unassisted. Normal, narrow-based gait.   Labs and Imaging  review: Internal labs: Lab Results  Component Value Date   HGBA1C 5.2 10/18/2021   Lab Results  Component Value Date   [id number] 098 01/25/2022   Lab Results  Component Value Date   TSH 0.845 10/18/2021   Lab Results  Component Value Date   ESRSEDRATE 29 (H) 10/20/2021   Normal or unremarkable: antithrombin 3, anticardiolipin abs, prothombin gene mutation, factor 5 leiden, homocysteine, beta-2 glycoprotein, lupus anticoagulant, p-ANCA, c-ANCA, SSA, SSB, complement, HIV  Imaging: CT head wo contrast (10/18/21): FINDINGS: Brain: There is no acute intracranial hemorrhage, mass effect, or edema. Gray-white differentiation is preserved. There is no extra-axial fluid collection. Ventricles and sulci are within normal limits in size and configuration.   Vascular: No hyperdense vessel or unexpected calcification.   Skull: Calvarium is unremarkable.   Sinuses/Orbits: No acute finding.   Other: None.   IMPRESSION: No acute intracranial abnormality.  MRI brain and MRI brain wo contrast (10/18/21): FINDINGS: MRI HEAD FINDINGS   Brain: Cerebral volume within normal limits. Few scattered subcentimeter foci of T2/FLAIR hyperintensity noted involving the supratentorial cerebral white matter, nonspecific, but overall minimal in nature, and of doubtful significance.   Small focus of restricted diffusion seen involving the superior right cerebellum and cerebellar vermis, consistent with an acute ischemic infarct (series 5, images 11-7). No associated hemorrhage or mass effect. No other evidence for acute or subacute ischemia. Gray-white matter differentiation otherwise maintained. No other areas of remote cortical infarction. No acute or chronic intracranial blood products.   No mass lesion or midline shift. No hydrocephalus or extra-axial fluid collection. Pituitary gland suprasellar region within normal limits. Midline structures intact.   Vascular: Major intracranial vascular  flow voids are maintained.   Skull and upper cervical spine: Craniocervical junction within normal limits. Bone marrow signal intensity normal. No scalp soft tissue abnormality.   Sinuses/Orbits: Globes orbital soft tissues within normal limits. Scattered mucosal thickening noted about the ethmoidal air cells and maxillary sinuses. Paranasal sinuses are otherwise clear. No mastoid effusion.   Other: None.   MRA HEAD FINDINGS   Anterior circulation: Examination moderately degraded by motion artifact.   Visualized distal cervical segments of the internal carotid arteries are patent with antegrade  flow. Petrous, cavernous, and supraclinoid segments patent without hemodynamically significant stenosis or other abnormality. A1 segments patent bilaterally. Normal anterior communicating artery complex. Both ACAs patent to their distal aspects without stenosis. M1 segments widely patent bilaterally. Left M1 is fenestrated distally. Normal MCA bifurcations. Distal MCA branches perfused and symmetric.   Posterior circulation: Visualized V4 segments widely patent. Right PICA patent at its origin. Left PICA not well seen. Basilar patent to its distal aspect without stenosis. Superior cerebral arteries patent bilaterally. Both PCAs primarily supplied via the basilar well perfused to their distal aspects.   Anatomic variants: None significant.  No intracranial aneurysm.   IMPRESSION: MRI HEAD IMPRESSION:   1. Small acute ischemic nonhemorrhagic infarct involving the superior right cerebellum. 2. Otherwise normal brain MRI for age.   MRA HEAD IMPRESSION:   1. Motion degraded exam. 2. Negative intracranial MRA. No large vessel occlusion, hemodynamically significant stenosis, or other acute vascular abnormality.  CTA neck (10/19/21): FINDINGS: Aortic arch: The aortic arch is unremarkable. There is a common origin of the brachiocephalic and left common carotid arteries, a normal  variant. The subclavian arteries are patent to the level imaged.   Right carotid system: Right common, internal, and external carotid arteries are patent, without hemodynamically significant stenosis or occlusion. There is no dissection or aneurysm.   Left carotid system: The left common, internal, and external carotid arteries are patent, without hemodynamically significant stenosis or occlusion. There is no dissection or aneurysm.   Vertebral arteries: The vertebral arteries are widely patent, without hemodynamically significant stenosis or occlusion. There is no evidence of dissection or aneurysm.   Skeleton: There is no acute osseous abnormality or aggressive osseous lesion. There is no significant degenerative change in the cervical spine. There is no visible canal hematoma.   Other neck: The soft tissues are unremarkable.   Upper chest: The imaged lung apices are clear.   IMPRESSION: Normal CTA neck. No hemodynamically significant stenosis, occlusion, dissection, or aneurysm.  Echo (10/19/21): IMPRESSIONS:  1. Left ventricular ejection fraction, by estimation, is 55 to 60%. The  left ventricle has normal function. The left ventricle has no regional  wall motion abnormalities. Left ventricular diastolic parameters were  normal.   2. Right ventricular systolic function is normal. The right ventricular  size is normal. Tricuspid regurgitation signal is inadequate for assessing  PA pressure.   3. The mitral valve is grossly normal. Mild mitral valve regurgitation.   4. The aortic valve is tricuspid. Aortic valve regurgitation is not  visualized. No aortic stenosis is present. Aortic valve mean gradient  measures 3.0 mmHg.   5. The inferior vena cava is normal in size with greater than 50%  respiratory variability, suggesting right atrial pressure of 3 mmHg.    Assessment/Plan:  Nancy Cochran is a 46 y.o. female who presents for stroke follow up. She has a relevant  medical history of DM, migraines, chronic low back pain, tobacco use, anxiety. Her neurological examination is pertinent for dysarthria, mild right sided weakness, dysmetria, and right sided sensory deficit. Stroke work up was negative for hypercoagulable state and echo did not indicate a cardiac etiology or shunt. While not completely clear, the most likely cause of patient's stroke was small vessel disease due to DM, HLD, and smoking.   PLAN: -Blood work: CMP -Speech therapy consult -Continue home exercises given by PT -Asa 81 mg daily -Crestor 10 mg daily. Goodrx coupon given so that patient can resume. She will call if unable to obtain medication.  -  Return to clinic in 6 months  The impression above as well as the plan as outlined below were extensively discussed with the patient (in the company of husband) who voiced understanding. All questions were answered to their satisfaction.  When available, results of the above investigations and possible further recommendations will be communicated to the patient via telephone/MyChart. Patient to call office if not contacted after expected testing turnaround time.   Total time spent reviewing records, interview, history/exam, documentation, and coordination of care on day of encounter:  65 min   Thank you for allowing me to participate in patient's care.  If I can answer any additional questions, I would be pleased to do so.  Kai Levins, MD   CC: Luetta Nutting, DO [place] [id number]  CC: Referring provider: Luetta Nutting, [facility] [address] [place],  [place] [address]

## 2022-06-12 ENCOUNTER — Encounter: Payer: Self-pay | Admitting: Neurology

## 2022-06-12 ENCOUNTER — Ambulatory Visit (INDEPENDENT_AMBULATORY_CARE_PROVIDER_SITE_OTHER): Payer: Self-pay | Admitting: Neurology

## 2022-06-12 ENCOUNTER — Other Ambulatory Visit (INDEPENDENT_AMBULATORY_CARE_PROVIDER_SITE_OTHER): Payer: Self-pay

## 2022-06-12 VITALS — BP 118/79 | HR 94 | Ht 64.0 in | Wt 162.0 lb

## 2022-06-12 DIAGNOSIS — R252 Cramp and spasm: Secondary | ICD-10-CM

## 2022-06-12 DIAGNOSIS — Z8673 Personal history of transient ischemic attack (TIA), and cerebral infarction without residual deficits: Secondary | ICD-10-CM

## 2022-06-12 DIAGNOSIS — E785 Hyperlipidemia, unspecified: Secondary | ICD-10-CM

## 2022-06-12 DIAGNOSIS — I6789 Other cerebrovascular disease: Secondary | ICD-10-CM

## 2022-06-12 DIAGNOSIS — G43809 Other migraine, not intractable, without status migrainosus: Secondary | ICD-10-CM

## 2022-06-12 DIAGNOSIS — R42 Dizziness and giddiness: Secondary | ICD-10-CM

## 2022-06-12 LAB — COMPREHENSIVE METABOLIC PANEL
ALT: 22 U/L (ref 0–35)
AST: 20 U/L (ref 0–37)
Albumin: 4.2 g/dL (ref 3.5–5.2)
Alkaline Phosphatase: 80 U/L (ref 39–117)
BUN: 11 mg/dL (ref 6–23)
CO2: 27 mEq/L (ref 19–32)
Calcium: 9.6 mg/dL (ref 8.4–10.5)
Chloride: 105 mEq/L (ref 96–112)
Creatinine, Ser: 0.72 mg/dL (ref 0.40–1.20)
GFR: 102.02 mL/min (ref >60.00)
Glucose, Bld: 81 mg/dL (ref 70–99)
Potassium: 4.2 mEq/L (ref 3.5–5.1)
Sodium: 139 mEq/L (ref 135–145)
Total Bilirubin: 0.4 mg/dL (ref 0.2–1.2)
Total Protein: 7.4 g/dL (ref 6.0–8.3)

## 2022-06-12 MED ORDER — ROSUVASTATIN CALCIUM 10 MG PO TABS: 10.0000 mg | ORAL_TABLET | Freq: Every day | ORAL | 3 refills | Status: DC

## 2022-06-12 NOTE — Patient Instructions (Signed)
9

## 2022-06-12 NOTE — Patient Instructions (Signed)
  -   Recommend the following measures that may provide some symptomatic benefit for muscle twitching and/or cramps: - Adequate oral clear fluid intake to maintain optimal hydration (about 2.5 liters, or around 8-10 glasses per day) Avoidance of caffeine Trial of DIET tonic water: About 1 glass, up to 6 times daily Magnesium oxide up to 400 mg by mouth twice daily, as needed (over the counter) Gentle muscle stretching routine, especially before bedtime

## 2022-06-13 ENCOUNTER — Encounter: Payer: Self-pay | Admitting: Family Medicine

## 2022-06-15 ENCOUNTER — Encounter: Payer: Self-pay | Admitting: Neurology

## 2022-06-24 ENCOUNTER — Telehealth: Payer: Self-pay | Admitting: Neurology

## 2022-06-24 NOTE — Telephone Encounter (Signed)
Called patient to discuss ear thumping symptoms. These symptoms predate her cerebellar stroke, so it is hard to understand how the newer cerebellar lesion would be responsible for her symptoms. 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 I explained that a cerebellar lesion can cause this, but that I had not evaluated her for it as her symptoms predated her stroke. I recommended she could try the clonazepam or sensory tricks (touching the roof of her mouthing, opening mouth wide, or valsava) to see if this helped.   She also mentioned a friend with ear thumping and a facial nerve compression. I mentioned that I did not see evidence of a facial nerve problem on exam, but that another MRI/MRA could evaluate. Patient had these since symptom onset though (at time of stroke) without clear pathology to explain symptoms. Given that patient does not currently have insurance, I explained that this may not be the best test at this time. Patient will consider her options and let me know if she would like the MRI/MRA. She was not interested in another medication at this time.  All questions were answered.  Kai Levins, MD [facility]

## 2022-07-10 ENCOUNTER — Telehealth: Payer: Self-pay

## 2022-07-10 NOTE — Telephone Encounter (Signed)
Called Pt and let her know that she would need to call [facility] and get an imaging from them because also needs to give them a medical release in order for them to do this.

## 2022-07-10 NOTE — Telephone Encounter (Signed)
-----   Message from Shellia Carwin, MD sent at 07/10/2022  2:06 PM EST ----- Regarding: Copy of MRI Do you know how patient could get a copy of her imaging? She is seeing someone in [place] who wanted the MRI of her brain.  Kai Levins, MD

## 2022-07-29 ENCOUNTER — Ambulatory Visit (INDEPENDENT_AMBULATORY_CARE_PROVIDER_SITE_OTHER): Payer: Self-pay | Admitting: Family Medicine

## 2022-07-29 ENCOUNTER — Encounter: Payer: Self-pay | Admitting: Family Medicine

## 2022-07-29 VITALS — BP 123/83 | HR 96 | Ht 64.0 in | Wt 165.0 lb

## 2022-07-29 DIAGNOSIS — L2082 Flexural eczema: Secondary | ICD-10-CM

## 2022-07-29 DIAGNOSIS — G43809 Other migraine, not intractable, without status migrainosus: Secondary | ICD-10-CM

## 2022-07-29 DIAGNOSIS — H938X1 Other specified disorders of right ear: Secondary | ICD-10-CM | POA: Insufficient documentation

## 2022-07-29 DIAGNOSIS — L989 Disorder of the skin and subcutaneous tissue, unspecified: Secondary | ICD-10-CM

## 2022-07-29 DIAGNOSIS — R5383 Other fatigue: Secondary | ICD-10-CM

## 2022-07-29 HISTORY — DX: Disorder of the skin and subcutaneous tissue, unspecified: L98.9

## 2022-07-29 HISTORY — DX: Other specified disorders of right ear: H93.8X1

## 2022-07-29 MED ORDER — TRIAMCINOLONE ACETONIDE 0.1 % EX CREA: 1.0000 | TOPICAL_CREAM | Freq: Two times a day (BID) | CUTANEOUS | 3 refills | Status: DC

## 2022-07-29 NOTE — Assessment & Plan Note (Signed)
Continue Elavil at current strength.  She did try increasing this previously however did not tolerate this well.  Adding Nurtec to use for breakthrough migraines.

## 2022-07-29 NOTE — Progress Notes (Signed)
Nancy Cochran - 46 y.o. female MRN [id number]  Date of birth: 03-Mar-1976  Subjective Chief Complaint  Patient presents with   Stroke Follow-up    HPI Nancy Cochran is a 46 year old female here today for follow-up visit.    Reports that she continues to have thumping sensation in her ear.  She has seen ENT as well as neurology.  Tried muscle laxer's and clonazepam without relief.  Botox recommended however she does not want to do this.  She has contacted Dr. Erin Sons a neurosurgeon in [place] to schedule an appointment based on advice from someone she knew with similar symptoms.  She is requesting a referral.  She has had itchy, scaly lesions on her neck, hands into the review.  She has not tried anything of these areas so far.  Does have some itching associated with this.  Continues to have migraines which have increased in intensity when she has these.  Continues on Elavil for prevention.  This had worked well for her previously.  She is having some increased joint pain and fatigue.  Will prefer to wait until after the first the year to have labs completed.  ROS:  A comprehensive ROS was completed and negative except as noted per HPI    No Known Allergies  Past Medical History:  Diagnosis Date   DDD (degenerative disc disease), cervical 2016   Dr. Karie Schwalbe   Gestational diabetes     History reviewed. No pertinent surgical history.  Social History   Socioeconomic History   Marital status: Married    Spouse name: Not on file   Number of children: 2   Years of education: Not on file   Highest education level: Not on file  Occupational History   Occupation: Oceanographer  Tobacco Use   Smoking status: Every Day    Packs/day: 1.00    Years: 28.00    Total pack years: 28.00    Types: Cigarettes   Smokeless tobacco: Never  Vaping Use   Vaping Use: Never used  Substance and Sexual Activity   Alcohol use: No   Drug use: No   Sexual activity: Yes    Partners: Male     Birth control/protection: Injection  Other Topics Concern   Not on file  Social History Narrative   Are you right handed or left handed? Right handed   Are you currently employed ? No       Do you live at home alone? Live with husband and Daughter      What type of home do you live in: 1 story or 2 story? One story   Caffeine 3-4 sodas a day       Social Determinants of Radio broadcast assistant Strain: Not on file  Food Insecurity: Not on file  Transportation Needs: Not on file  Physical Activity: Not on file  Stress: Not on file  Social Connections: Not on file    Family History  Problem Relation Age of Onset   Heart disease Father    Hypertension Maternal Grandmother    Heart attack Maternal Grandmother    Hypertension Maternal Grandfather    Heart attack Maternal Grandfather    Hypertension Paternal Grandmother    Heart attack Paternal Grandmother    Stroke Paternal Grandmother    Hypertension Paternal Grandfather    Heart attack Paternal Grandfather    Stroke Maternal Aunt    Diabetes Maternal Uncle    Breast cancer Neg Hx  Mothers family   Cancer Neg Hx        Fathers family    Health Maintenance  Topic Date Due   DTaP/Tdap/Td (1 - Tdap) Never done   PAP SMEAR-Modifier  07/29/2022   INFLUENZA VACCINE  11/10/2022 (Originally 03/12/2022)   Hepatitis C Screening  01/26/2023 (Originally 06/26/1996)   MAMMOGRAM  10/26/2023   HIV Screening  Completed   HPV VACCINES  Aged Out   COVID-19 Vaccine  Discontinued     ----------------------------------------------------------------------------------------------------------------------------------------------------------------------------------------------------------------- Physical Exam BP 123/83 (BP Location: Right Arm, Patient Position: Sitting, Cuff Size: Normal)   Pulse 96   Ht 5' 4"  (1.626 m)   Wt 165 lb (74.8 kg)   SpO2 99%   BMI 28.32 kg/m   Physical Exam Constitutional:      Appearance:  Normal appearance.  Eyes:     General: No scleral icterus. Skin:    Comments: Dry, scaly lesions noted on upper neck, external ear.  Neurological:     General: No focal deficit present.     Mental Status: She is alert.  Psychiatric:        Mood and Affect: Mood normal.        Behavior: Behavior normal.     ------------------------------------------------------------------------------------------------------------------------------------------------------------------------------------------------------------------- Assessment and Plan  Migraines Continue Elavil at current strength.  She did try increasing this previously however did not tolerate this well.  Adding Nurtec to use for breakthrough migraines.  Eczema Adding triamcinolone cream.  She will let me know if this is not helping.  She does have a different lesion on the fingers and I will place referral to dermatology for this.  Abnormal sensation in ear, right Continues to have thumping sensation in the right ear.  Referral placed to ear and skull base specialist in [place].  Other fatigue Continues to have fatigue and joint pain.  Will plan to update labs and she would prefer to have this done after the first of the year.   Meds ordered this encounter  Medications   triamcinolone cream (KENALOG) 0.1 %    Sig: Apply 1 Application topically 2 (two) times daily.    Dispense:  80 g    Refill:  3    No follow-ups on file.    This visit occurred during the SARS-CoV-2 public health emergency.  Safety protocols were in place, including screening questions prior to the visit, additional usage of staff PPE, and extensive cleaning of exam room while observing appropriate contact time as indicated for disinfecting solutions.

## 2022-07-29 NOTE — Patient Instructions (Addendum)
Try nurtec at onset of migraine.  Let me know if this is or isn't effective.   Try flonase sensimist.   Use triamcinolone twice daily on neck, foot and ear.  Let me know if not improving.   Let's do labs once your insurance is active.  Let me know after the first of the year when you can come in for this.

## 2022-07-29 NOTE — Assessment & Plan Note (Signed)
Continues to have fatigue and joint pain.  Will plan to update labs and she would prefer to have this done after the first of the year.

## 2022-07-29 NOTE — Assessment & Plan Note (Signed)
Continues to have thumping sensation in the right ear.  Referral placed to ear and skull base specialist in [place].

## 2022-07-29 NOTE — Assessment & Plan Note (Signed)
Adding triamcinolone cream.  She will let me know if this is not helping.  She does have a different lesion on the fingers and I will place referral to dermatology for this.

## 2022-09-03 DIAGNOSIS — H9313 Tinnitus, bilateral: Secondary | ICD-10-CM

## 2022-09-03 DIAGNOSIS — I639 Cerebral infarction, unspecified: Secondary | ICD-10-CM

## 2022-09-03 HISTORY — DX: Tinnitus, bilateral: H93.13

## 2022-09-03 HISTORY — DX: Cerebral infarction, unspecified: I63.9

## 2022-09-07 ENCOUNTER — Encounter: Payer: Self-pay | Admitting: Family Medicine

## 2022-09-16 LAB — HM DIABETES EYE EXAM

## 2022-09-17 ENCOUNTER — Encounter: Payer: Self-pay | Admitting: Family Medicine

## 2022-09-17 DIAGNOSIS — I471 Supraventricular tachycardia, unspecified: Secondary | ICD-10-CM

## 2022-09-18 NOTE — Telephone Encounter (Signed)
Referral to cardiology entered.

## 2022-09-25 ENCOUNTER — Other Ambulatory Visit: Payer: Self-pay | Admitting: Family Medicine

## 2022-09-25 DIAGNOSIS — Z8673 Personal history of transient ischemic attack (TIA), and cerebral infarction without residual deficits: Secondary | ICD-10-CM

## 2022-09-25 DIAGNOSIS — R5383 Other fatigue: Secondary | ICD-10-CM

## 2022-09-25 NOTE — Telephone Encounter (Signed)
Lab orders entered

## 2022-10-02 ENCOUNTER — Other Ambulatory Visit: Payer: Managed Care, Other (non HMO)

## 2022-10-03 ENCOUNTER — Other Ambulatory Visit: Payer: Self-pay | Admitting: Family Medicine

## 2022-10-03 ENCOUNTER — Encounter: Payer: Self-pay | Admitting: Family Medicine

## 2022-10-03 DIAGNOSIS — E119 Type 2 diabetes mellitus without complications: Secondary | ICD-10-CM

## 2022-10-03 DIAGNOSIS — D72829 Elevated white blood cell count, unspecified: Secondary | ICD-10-CM

## 2022-10-03 LAB — CBC WITH DIFFERENTIAL/PLATELET
Absolute Monocytes: 718 cells/uL (ref 200–950)
Basophils Absolute: 110 cells/uL (ref 0–200)
Basophils Relative: 0.8 %
Eosinophils Absolute: 304 cells/uL (ref 15–500)
Eosinophils Relative: 2.2 %
HCT: 44.3 % (ref 35.0–45.0)
Hemoglobin: 14.7 g/dL (ref 11.7–15.5)
Lymphs Abs: 2691 cells/uL (ref 850–3900)
MCH: 29.5 pg (ref 27.0–33.0)
MCHC: 33.2 g/dL (ref 32.0–36.0)
MCV: 89 fL (ref 80.0–100.0)
MPV: 9.7 fL (ref 7.5–12.5)
Monocytes Relative: 5.2 %
Neutro Abs: 9977 cells/uL — ABNORMAL HIGH (ref 1500–7800)
Neutrophils Relative %: 72.3 %
Platelets: 369 10*3/uL (ref 140–400)
RBC: 4.98 10*6/uL (ref 3.80–5.10)
RDW: 12.6 % (ref 11.0–15.0)
Total Lymphocyte: 19.5 %
WBC: 13.8 10*3/uL — ABNORMAL HIGH (ref 3.8–10.8)

## 2022-10-03 LAB — COMPLETE METABOLIC PANEL WITH GFR
AG Ratio: 1.4 (calc) (ref 1.0–2.5)
ALT: 25 U/L (ref 6–29)
AST: 25 U/L (ref 10–30)
Albumin: 4.2 g/dL (ref 3.6–5.1)
Alkaline phosphatase (APISO): 93 U/L (ref 31–125)
BUN: 12 mg/dL (ref 7–25)
CO2: 26 mmol/L (ref 20–32)
Calcium: 9.6 mg/dL (ref 8.6–10.2)
Chloride: 106 mmol/L (ref 98–110)
Creat: 0.8 mg/dL (ref 0.50–0.99)
Globulin: 3.1 g/dL (calc) (ref 1.9–3.7)
Glucose, Bld: 89 mg/dL (ref 65–99)
Potassium: 4.9 mmol/L (ref 3.5–5.3)
Sodium: 143 mmol/L (ref 135–146)
Total Bilirubin: 0.4 mg/dL (ref 0.2–1.2)
Total Protein: 7.3 g/dL (ref 6.1–8.1)
eGFR: 93 mL/min/{1.73_m2} (ref >=60)

## 2022-10-03 LAB — LIPID PANEL W/REFLEX DIRECT LDL
Cholesterol: 128 mg/dL (ref <200)
HDL: 39 mg/dL — ABNORMAL LOW (ref >=50)
LDL Cholesterol (Calc): 66 mg/dL (calc)
Non-HDL Cholesterol (Calc): 89 mg/dL (calc) (ref <130)
Total CHOL/HDL Ratio: 3.3 (calc) (ref <5.0)
Triglycerides: 145 mg/dL (ref <150)

## 2022-10-03 LAB — HEMOGLOBIN A1C
Hgb A1c MFr Bld: 7.2 % of total Hgb — ABNORMAL HIGH (ref <5.7)
Mean Plasma Glucose: 160 mg/dL
eAG (mmol/L): 8.9 mmol/L

## 2022-10-03 LAB — TSH: TSH: 1.24 mIU/L

## 2022-10-03 MED ORDER — METFORMIN HCL ER 500 MG PO TB24: 500.0000 mg | ORAL_TABLET | Freq: Every day | ORAL | 1 refills | Status: DC

## 2022-10-07 ENCOUNTER — Other Ambulatory Visit: Payer: Self-pay | Admitting: Family Medicine

## 2022-10-07 ENCOUNTER — Telehealth: Payer: Self-pay

## 2022-10-07 NOTE — Telephone Encounter (Signed)
Referral for dietician ordered.  Recommend appt if sinus symptoms are not improving.   CM

## 2022-10-07 NOTE — Telephone Encounter (Signed)
Pt requesting Nutrition class for new diagnosis.   States sinus infection x 1 week. Has lab appt on Thursday.

## 2022-10-10 ENCOUNTER — Other Ambulatory Visit: Payer: Managed Care, Other (non HMO)

## 2022-10-14 DIAGNOSIS — O24419 Gestational diabetes mellitus in pregnancy, unspecified control: Secondary | ICD-10-CM | POA: Insufficient documentation

## 2022-10-16 ENCOUNTER — Encounter: Payer: Self-pay | Admitting: Family Medicine

## 2022-10-18 ENCOUNTER — Other Ambulatory Visit: Payer: Self-pay

## 2022-10-18 DIAGNOSIS — D72829 Elevated white blood cell count, unspecified: Secondary | ICD-10-CM

## 2022-10-18 LAB — PATHOLOGIST SMEAR REVIEW

## 2022-10-18 LAB — IRON,TIBC AND FERRITIN PANEL
%SAT: 14 % (calc) — ABNORMAL LOW (ref 16–45)
Ferritin: 93 ng/mL (ref 16–232)
Iron: 48 ug/dL (ref 40–190)
TIBC: 337 mcg/dL (calc) (ref 250–450)

## 2022-10-18 LAB — EXTRA SPECIMEN

## 2022-10-18 LAB — COPPER, SERUM

## 2022-10-18 LAB — ZINC

## 2022-10-30 ENCOUNTER — Encounter: Payer: Self-pay | Admitting: Cardiology

## 2022-10-30 ENCOUNTER — Ambulatory Visit: Payer: Managed Care, Other (non HMO) | Attending: Cardiology | Admitting: Cardiology

## 2022-10-30 ENCOUNTER — Ambulatory Visit: Payer: Managed Care, Other (non HMO) | Attending: Cardiology

## 2022-10-30 VITALS — BP 110/76 | HR 91 | Ht 62.6 in | Wt 166.1 lb

## 2022-10-30 DIAGNOSIS — Z8673 Personal history of transient ischemic attack (TIA), and cerebral infarction without residual deficits: Secondary | ICD-10-CM

## 2022-10-30 DIAGNOSIS — I639 Cerebral infarction, unspecified: Secondary | ICD-10-CM | POA: Diagnosis not present

## 2022-10-30 DIAGNOSIS — I471 Supraventricular tachycardia, unspecified: Secondary | ICD-10-CM

## 2022-10-30 DIAGNOSIS — E088 Diabetes mellitus due to underlying condition with unspecified complications: Secondary | ICD-10-CM | POA: Diagnosis not present

## 2022-10-30 DIAGNOSIS — E782 Mixed hyperlipidemia: Secondary | ICD-10-CM

## 2022-10-30 DIAGNOSIS — R002 Palpitations: Secondary | ICD-10-CM

## 2022-10-30 DIAGNOSIS — E669 Obesity, unspecified: Secondary | ICD-10-CM

## 2022-10-30 NOTE — Progress Notes (Signed)
Cardiology Office Note:    Date:  10/30/2022   ID:  [name], DOB [birth year], MRN [id number]  PCP:  Luetta Nutting, DO  Cardiologist:  Jenean Lindau, MD   Referring MD: Luetta Nutting, DO    ASSESSMENT:    1. Cerebellar infarct ([place])   2. History of cerebellar stroke   3. Diabetes mellitus due to underlying condition with unspecified complications (HCC)   4. Paroxysmal SVT (supraventricular tachycardia)   5. Mixed dyslipidemia   6. Obesity (BMI 30.0-34.9)   7. Palpitations    PLAN:    In order of problems listed above:  Primary prevention stressed with the patient.  Importance of compliance with diet medication stressed and she vocalized understanding.  She was advised to walk at least half an hour a day 5 days a week and she promises to do so. Diabetes mellitus: Diet emphasized.  Ambulation stressed.  This is followed by primary care. History of stroke: Followed by primary care Palpitations: This is of concern to me.  This is especially in view of the fact that she has had a stroke and will do a 1 month monitor.  Fortunately her TSH is fine. History of SVT: Monitoring will help me understand what the scope of SVT is especially in view of her palpitation symptomatology. Obesity: Weight reduction stressed and diet was emphasized and she promises to do better. Mixed dyslipidemia: On lipid lowering medications followed by primary care.  Goal LDL must be less than 60. Patient will be seen in follow-up appointment in 6 months or earlier if the patient has any concerns.    Medication Adjustments/Labs and Tests Ordered: Current medicines are reviewed at length with the patient today.  Concerns regarding medicines are outlined above.  No orders of the defined types were placed in this encounter.  No orders of the defined types were placed in this encounter.    History of Present Illness:    Nancy Cochran is a 47 y.o. female who is being seen today for the evaluation  of palpitations at the request of Luetta Nutting, DO.  Patient has past medical history of mixed dyslipidemia, diabetes mellitus, obesity and history of stroke.  She was evaluated extensively last year for this.  Neurosurgery also has seen her.  She denies any chest pain orthopnea or PND.  She complains of continued palpitations.  No chest pain orthopnea PND.  She leads a sedentary lifestyle.  No dizziness or syncope.  Past Medical History:  Diagnosis Date   Abnormal sensation in ear, right 07/29/2022   Cerebellar infarct ([place]) 09/03/2022   Colitis 10/18/2021   DDD (degenerative disc disease), cervical 2016   Dr. 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   albuterol (VENTOLIN  HFA) 108 (90 Base) MCG/ACT inhaler Inhale 2 puffs into the lungs every 6 (six) hours as needed for wheezing or shortness of breath.   amitriptyline (ELAVIL) 25 MG tablet TAKE 1 TABLET BY MOUTH EVERYDAY AT BEDTIME   aspirin EC 81 MG EC tablet Take 1 tablet (81 mg total) by mouth daily with breakfast. Swallow whole.   medroxyPROGESTERone (DEPO-PROVERA) 150 MG/ML injection Inject 150 mg into the muscle every 3 (three) months.   metFORMIN (GLUCOPHAGE-XR) 500 MG 24 hr tablet Take 1 tablet (500 mg total) by mouth daily with  breakfast.   metoprolol tartrate (LOPRESSOR) 25 MG tablet Take 1 tablet (25 mg total) by mouth 2 (two) times daily.   Multiple Vitamin (MULTIVITAMIN) capsule Take 1 capsule by mouth daily.   rosuvastatin (CRESTOR) 10 MG tablet Take 1 tablet (10 mg total) by mouth daily.   triamcinolone cream (KENALOG) 0.1 % Apply 1 Application topically 2 (two) times daily.   zolpidem (AMBIEN) 10 MG tablet TAKE 1/2-1 TABLET BY MOUTH EVERY DAY AT BEDTIME AS NEEDED FOR SLEEP     Allergies:   Patient has no known allergies.   Social History   Socioeconomic History   Marital status: Married    Spouse name: Not on file   Number of children: 2   Years of education: Not on file   Highest education level: Not on file  Occupational History   Occupation: Oceanographer  Tobacco Use   Smoking status: Every Day    Packs/day: 1.00    Years: 28.00    Additional pack years: 0.00    Total pack years: 28.00    Types: Cigarettes   Smokeless tobacco: Never  Vaping Use   Vaping Use: Never used  Substance and Sexual Activity   Alcohol use: No   Drug use: No   Sexual activity: Yes    Partners: Male    Birth control/protection: Injection  Other Topics Concern   Not on file  Social History Narrative   Are you right handed or left handed? Right handed   Are you currently employed ? No       Do you live at home alone? Live with husband and Daughter      What type of home do you live in: 1 story or 2 story? One story   Caffeine 3-4 sodas a day       Social Determinants of Health   Financial Resource Strain: Not on file  Food Insecurity: Not on file  Transportation Needs: Not on file  Physical Activity: Not on file  Stress: Not on file  Social Connections: Not on file     Family History: The patient's family history includes Diabetes in her maternal uncle; Heart attack in her maternal grandfather, maternal grandmother, paternal grandfather, and paternal grandmother; Heart disease in her father;  Hypertension in her maternal grandfather, maternal grandmother, paternal grandfather, and paternal grandmother; Stroke in her maternal aunt and paternal grandmother. There is no history of Breast cancer or Cancer.  ROS:   Please see the history of present illness.    All other systems reviewed and are negative.  EKGs/Labs/Other Studies Reviewed:    The following studies were reviewed today: EKG reveals sinus rhythm left axis deviation right bundle branch block and nonspecific ST-T changes   Recent Labs: 10/02/2022: ALT 25; BUN 12; Creat 0.80; Hemoglobin 14.7; Platelets 369; Potassium 4.9; Sodium 143; TSH 1.24  Recent Lipid Panel    Component Value Date/Time   CHOL 128 10/02/2022 1021  TRIG 145 10/02/2022 1021   HDL 39 (L) 10/02/2022 1021   CHOLHDL 3.3 10/02/2022 1021   VLDL 28 10/19/2021 0456   LDLCALC 66 10/02/2022 1021    Physical Exam:    VS:  BP 110/76   Pulse 91   Ht 5' 2.6" (1.59 m)   Wt 166 lb 1.3 oz (75.3 kg)   SpO2 99%   BMI 29.80 kg/m     Wt Readings from Last 3 Encounters:  10/30/22 166 lb 1.3 oz (75.3 kg)  07/29/22 165 lb (74.8 kg)  06/12/22 162 lb (73.5 kg)     GEN: Patient is in no acute distress HEENT: Normal NECK: No JVD; No carotid bruits LYMPHATICS: No lymphadenopathy CARDIAC: S1 S2 regular, 2/6 systolic murmur at the apex. RESPIRATORY:  Clear to auscultation without rales, wheezing or rhonchi  ABDOMEN: Soft, non-tender, non-distended MUSCULOSKELETAL:  No edema; No deformity  SKIN: Warm and dry NEUROLOGIC:  Alert and oriented x 3 PSYCHIATRIC:  Normal affect    Signed, Jenean Lindau, MD  10/30/2022 10:16 AM    [place]

## 2022-10-30 NOTE — Patient Instructions (Signed)
Medication Instructions:  Your physician recommends that you continue on your current medications as directed. Please refer to the Current Medication list given to you today.  *If you need a refill on your cardiac medications before your next appointment, please call your pharmacy*   Lab Work: None ordered If you have labs (blood work) drawn today and your tests are completely normal, you will receive your results only by: [place] (if you have MyChart) OR A paper copy in the mail If you have any lab test that is abnormal or we need to change your treatment, we will call you to review the results.   Testing/Procedures: Your physician has recommended that you wear an event monitor. Event monitors are medical devices that record the heart's electrical activity. Doctors most often [place] these monitors to diagnose arrhythmias. Arrhythmias are problems with the speed or rhythm of the heartbeat. The monitor is a small, portable device. You can wear one while you do your normal daily activities. This is usually used to diagnose what is causing palpitations/syncope (passing out). This is a 30 day monitor.    Follow-Up: At [facility], you and your health needs are our priority.  As part of our continuing mission to provide you with exceptional heart care, we have created designated Provider Care Teams.  These Care Teams include your primary Cardiologist (physician) and Advanced Practice Providers (APPs -  Physician Assistants and Nurse Practitioners) who all work together to provide you with the care you need, when you need it.  We recommend signing up for the patient portal called "MyChart".  Sign up information is provided on this After Visit Summary.  MyChart is used to connect with patients for Virtual Visits (Telemedicine).  Patients are able to view lab/test results, encounter notes, upcoming appointments, etc.  Non-urgent messages can be sent to your provider as well.   To learn  more about what you can do with MyChart, go to [email or web address].    Your next appointment:   12 month(s)  The format for your next appointment:   In Person  Provider:   Jyl Heinz, MD    Other Instructions none  Important Information About Sugar

## 2022-10-31 LAB — PATHOLOGIST SMEAR REVIEW

## 2022-10-31 LAB — COPPER, SERUM: Copper: 114 ug/dL (ref 70–175)

## 2022-10-31 LAB — ZINC: Zinc: 70 ug/dL (ref 60–130)

## 2022-11-11 ENCOUNTER — Other Ambulatory Visit: Payer: Self-pay | Admitting: Family Medicine

## 2022-11-11 DIAGNOSIS — F5101 Primary insomnia: Secondary | ICD-10-CM

## 2022-11-11 MED ORDER — ZOLPIDEM TARTRATE 10 MG PO TABS: ORAL_TABLET | ORAL | 0 refills | Status: DC

## 2022-11-14 ENCOUNTER — Encounter: Payer: Self-pay | Admitting: Family Medicine

## 2022-11-14 ENCOUNTER — Encounter: Payer: Self-pay | Admitting: Nutrition

## 2022-11-14 ENCOUNTER — Encounter: Payer: Managed Care, Other (non HMO) | Attending: Family Medicine | Admitting: Nutrition

## 2022-11-14 VITALS — Ht 63.0 in | Wt 167.0 lb

## 2022-11-14 DIAGNOSIS — E1169 Type 2 diabetes mellitus with other specified complication: Secondary | ICD-10-CM

## 2022-11-14 DIAGNOSIS — E782 Mixed hyperlipidemia: Secondary | ICD-10-CM | POA: Diagnosis present

## 2022-11-14 DIAGNOSIS — E669 Obesity, unspecified: Secondary | ICD-10-CM | POA: Insufficient documentation

## 2022-11-14 DIAGNOSIS — E088 Diabetes mellitus due to underlying condition with unspecified complications: Secondary | ICD-10-CM | POA: Diagnosis present

## 2022-11-14 DIAGNOSIS — I639 Cerebral infarction, unspecified: Secondary | ICD-10-CM | POA: Diagnosis present

## 2022-11-14 NOTE — Progress Notes (Signed)
Medical Nutrition Therapy  Appointment Start time:  [id number]  Appointment End time:  54  Primary concerns today: DM Type 2  Referral diagnosis: E11.8 Preferred learning style: No preference .ence indicated) Learning readiness: Ready  NUTRITION ASSESSMENT  47 yr old wfemale here for diabetes education. "I had a stroke and now I have diabetes. My husband has kidney cancer. It's very stressful." PMH: Migraines, Vertigo, Cerebellar infarct, DDD,dyslipidemia, Obesity and Type 2 DM. She notes she has gained about 50 lbs in the last year since she had her stroke and couldn't drive and dealing with her husband who has incurable kidney cancer. She notes her UBW was 115 lbs a year ago.Marland Kitchen Has a glucometer. Tests BS a few times per week. FBS this am was 100 mg/dl. She notes she has periods of shakiness and sweaty, frequently after breakfast, tyically when she eats dry ego waffles without syrup. A1C 7.2% Diet is high in processed foods from frozen sausage biscuits, desserts, regular and diet sodas, sweets and desserts. Diet is low in fresh fruits, vegetables and whole grains. Her husband and son don't like to eat a lot of fruits and vegetables and it makes it harder on her to eat healthier. Hasn't done much meal prepping lately. But willing to start. She reports eating a lot on the road fast food due to the numerous doctor appts and having to get up early to travel. She has been trying to read food labels and watch for fat and sodium but doesn't know what to look for with carbohydrates.  She is able to walk, drive and do her ADL's. Still has some weakness in her right hand.Valentino Saxon to do some exercises at home.  Feels overwhelmed  and very stressed with all that she has to do physically and mentally caring for her family, herself and the health issues.  She is willing to work with lifestyle medicine  using whole plant based foods to improve her health regarding her stroke, DM, Obesity, Hyperlipidemia and general  fatigue.  Anthropometrics  Wt Readings from Last 3 Encounters:  11/14/22 167 lb (75.8 kg)  10/30/22 166 lb 1.3 oz (75.3 kg)  07/29/22 165 lb (74.8 kg)   Ht Readings from Last 3 Encounters:  11/14/22 5\' 3"  (1.6 m)  10/30/22 5' 2.6" (1.59 m)  07/29/22 5\' 4"  (1.626 m)   Body mass index is 29.58 kg/m. @BMIFA @ Facility age limit for growth %iles is 20 years. Facility age limit for growth %iles is 20 years.   Clinical Medical Hx: See chart Medications: See chart Metformin 500 mg a day. Labs:  Lab Results  Component Value Date   HGBA1C 7.2 (H) 10/02/2022    Notable Signs/Symptoms: Fatigue,   Lifestyle & Dietary Hx Lives with her husband and son. Had a stroke.  Husband has kidney cancer.  Estimated daily fluid intake: 32 oz Supplements:  Sleep: poor  Stress / self-care: a lot with her health issues and her husbands Current average weekly physical activity: ADL  24-Hr Dietary Recall First Meal: Consulting civil engineer sausage biscuits, pepsi Snack:  Second Meal: fast food or salad or sandwich, chips, soda, or tea,  Snack: chips/misc Third Meal: varies fast food or processed foods, diet soda,  Snack: pie or pieces of chocolate Beverages: soda, diet soda  Estimated Energy Needs Calories: 1200 Carbohydrate: 135g Protein: 90g Fat: 33g   NUTRITION DIAGNOSIS  NI-1.7 Predicted excessive energy intake As related to HIgh calorie high carb diet.  As evidenced by 50 lbs weight gain and  A1C 7.4%.Marland Kitchen   NUTRITION INTERVENTION  Nutrition education (E-1) on the following topics:  Nutrition and Diabetes education provided on My Plate, CHO counting, meal planning, portion sizes, timing of meals, avoiding snacks between meals unless having a low blood sugar, target ranges for A1C and blood sugars, signs/symptoms and treatment of hyper/hypoglycemia, monitoring blood sugars, taking medications as prescribed, benefits of exercising 30 minutes per day and prevention of complications of DM. Lifestyle  Medicine  - Whole Food, Plant Predominant Nutrition is highly recommended: Eat Plenty of vegetables, Mushrooms, fruits, Legumes, Whole Grains, Nuts, seeds in lieu of processed meats, processed snacks/pastries red meat, poultry, eggs.    -It is better to avoid simple carbohydrates including: Cakes, Sweet Desserts, Ice Cream, Soda (diet and regular), Sweet Tea, Candies, Chips, Cookies, Store Bought Juices, Alcohol in Excess of  1-2 drinks a day, Lemonade,  Artificial Sweeteners, Doughnuts, Coffee Creamers, "Sugar-free" Products, etc, etc.  This is not a complete list.....  Exercise: If you are able: 30 -60 minutes a day ,4 days a week, or 150 minutes a week.  The longer the better.  Combine stretch, strength, and aerobic activities.  If you were told in the past that you have high risk for cardiovascular diseases, you may seek evaluation by your heart doctor prior to initiating moderate to intense exercise programs.     Handouts Provided Include  Know your numbers What is DM Type 2? Lifestyle Medicine Dietary spectrum for whole plant based foods, nutrition and exercise.   Learning Style & Readiness for Change Teaching method utilized: Visual & Auditory  Demonstrated degree of understanding via: Teach Back  Barriers to learning/adherence to lifestyle change: none  Goals Established by Pt Goals  Cut out pepsi and drink only water  Increase more whole grains, fruits and vegetables. Cut out sausasage biscuits, desserts, pies and sweets Exercise 30 minutes or more per day   MONITORING & EVALUATION Dietary intake, weekly physical activity, and blood sugars in 1 month.  Next Steps  Patient is to work on better meal planning.Marland Kitchen

## 2022-11-14 NOTE — Telephone Encounter (Signed)
Patient informed and will return.

## 2022-11-14 NOTE — Patient Instructions (Signed)
Goals  Cut out pepsi and drink only water  Increase more whole grains, fruits and vegetables. Cut out sausasage biscuits, desserts, pies and sweets Exercise 30 minutes or more per day

## 2022-11-14 NOTE — Telephone Encounter (Signed)
Does not need appt at this time. 6 months from last appt.

## 2022-11-15 ENCOUNTER — Other Ambulatory Visit: Payer: Self-pay | Admitting: Family Medicine

## 2022-11-15 DIAGNOSIS — E1169 Type 2 diabetes mellitus with other specified complication: Secondary | ICD-10-CM | POA: Insufficient documentation

## 2022-11-15 MED ORDER — FREESTYLE LIBRE 3 SENSOR MISC: 6 refills | Status: DC

## 2022-11-26 ENCOUNTER — Ambulatory Visit: Payer: Managed Care, Other (non HMO) | Admitting: Skilled Nursing Facility1

## 2022-11-29 ENCOUNTER — Encounter: Payer: Self-pay | Admitting: Cardiology

## 2022-12-05 NOTE — Progress Notes (Signed)
NEUROLOGY FOLLOW UP OFFICE NOTE  Nancy Cochran [id number]  Subjective:  Nancy Cochran is a 47 y.o. year old right-handed female with a medical history of DM, migraines, chronic low back pain, tobacco use, anxiety who we last saw on 06/12/22.  To briefly review: Patient presented to the ED on 10/18/21 for vertigo, nausea, and vomiting. Patient was found to have colitis by CT abdomen and pelvis, but work up for stroke was also started. 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 She had dysarthria and right sided weakness (arm and leg) as well. Patient is not sure if this was present initially, but it was definitely noted when she returned home.    She had hypercoagulable work-up including factor V Leiden, prothrombin gene mutation, cardiolipin antibodies, beta-2 glycoprotein antibodies, lupus anticoagulant panel which were all unremarkable.  SPEP unremarkable.  No evidence of elevated ANCA titers or complement levels.  Protein S total, activity, protein C total, activity is also unremarkable.  No evidence of hypothyroidism. Cholesterol was elevated, so statin was started. She was also started on asa 81 mg. HbA1c was 5.2, TSH 0.845. Telemetry showed no arrhythmias. Echo showed no significant abnormalities. Zio patch was ordered for outpatient. Patient improved and was discharged with outpatient PT. She has not done speech therapy (was referred but could not find someone that would take her).   Patient was initially seen at [facility] in 12/2021 for post stroke care. Per their documentation, patient had aphasia and right sided weakness. Their documentation implicates depo birth control shot and smoking as likely causes of her stroke. Per patient, [facility] told her to take 2 asa 81mg  daily, so she has been. She also stopped the Crestor 10 mg due to price increase (patient does not have insurance). Patient is still taking the depo birth control shot. She quit smoking after her  stroke.   Current symptoms: -Has mild dysarthria -Thumping in right ear, which she has seen ENT and PCP without cause. It has improved but still there. -Right sided weakness, improving but still weak -Occasional dizziness, especially with quick movements -Muscle spasms on right face, arm, and stomach -Feels like her heart races more often -Takes amitriptyline for migraines (having 4-5 per month); takes tylenol for abortive but does not help much  Most recent Assessment and Plan (06/12/22): Her neurological examination is pertinent for dysarthria, mild right sided weakness, dysmetria, and right sided sensory deficit. Stroke work up was negative for hypercoagulable state and echo did not indicate a cardiac etiology or shunt. While not completely clear, the most likely cause of patient's stroke was small vessel disease due to DM, HLD, and smoking.    PLAN: -Blood work: CMP -Speech therapy consult -Continue home exercises given by PT -Asa 81 mg daily -Crestor 10 mg daily. Goodrx coupon given so that patient can resume. She will call if unable to obtain medication.  Since their last visit: Patient messaged on 06/15/22 about thumping in ear worsening, wondering if Crestor may have contributed. 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 While cerebellar lesions can cause this, her symptoms predate her stroke, with thumping starting in 07/2021 and stroke in 10/2021. She was given clonazepam by ENT. She tried this for a week, but it did not help. She was recommended botox by ENT, but did not want to pursue this. I also recommended sensory tricks (touching the roof of her mouthing, opening mouth wide, or valsava) to see if this helped.  Per my  telephone note on 06/24/22: "She also mentioned a friend with ear thumping and a facial nerve compression. I mentioned that I did not see evidence of a facial nerve problem on exam, but that another MRI/MRA could evaluate. Patient had these since  symptom onset though (at time of stroke) without clear pathology to explain symptoms. Given that patient does not currently have insurance, I explained that this may not be the best test at this time. Patient will consider her options and let me know if she would like the MRI/MRA. She was not interested in another medication at this time."  Patient was seen by Dr. Ricke Hey in [facility] neurosurgery on 09/26/22. Per that note: At this point, we do not see any reason for the patient's pulsatile tinnitus. I explained the findings of the scans to the patient and she states understanding. At this point, we could offer a diagnostic cerebral angiogram if patient would like to further investigate causes for her pulsatile tinnitus. Risks and benefits of the procedure were discussed and she would like to think about it. She will let our office know if she would like to proceed. If she does not want to have the angiogram, she can folow up on an as needed basis. Patient should follow up with neurology for the treatment of her palatal myoclonus. Patient is in agreement with plan and all questions were answered. Should the patient have any questions, concerns or changes in neuro status, they should call our office.   Patient still has some symptoms, but since taking magnesium (started back in the middle of 09/2022), which I suggested for muscles cramps, this has almost completely resolved.  She does not have new complaints. She does mention that she may have weakness and incoordination in her right arm and leg.   Patient completed physical therapy. She did not do speech therapy due to cost (she didn't have insurance). She does have insurance, but is not interested in more therapy.  She has not been smoking since her stroke. She continues asa and statin as prescribed.  MEDICATIONS:  Outpatient Encounter Medications as of 12/12/2022  Medication Sig   albuterol (VENTOLIN HFA) 108 (90 Base) MCG/ACT inhaler Inhale 2 puffs  into the lungs every 6 (six) hours as needed for wheezing or shortness of breath.   amitriptyline (ELAVIL) 25 MG tablet TAKE 1 TABLET BY MOUTH EVERYDAY AT BEDTIME   aspirin EC 81 MG EC tablet Take 1 tablet (81 mg total) by mouth daily with breakfast. Swallow whole.   Continuous Blood Gluc Sensor (FREESTYLE LIBRE 3 SENSOR) MISC Place 1 sensor on the skin every 14 days. Use to check glucose continuously.  Dx: E11.69   magnesium oxide (MAG-OX) 400 (240 Mg) MG tablet Take 400 mg by mouth daily.   medroxyPROGESTERone (DEPO-PROVERA) 150 MG/ML injection Inject 150 mg into the muscle every 3 (three) months.   metFORMIN (GLUCOPHAGE-XR) 500 MG 24 hr tablet Take 1 tablet (500 mg total) by mouth daily with breakfast.   metoprolol tartrate (LOPRESSOR) 25 MG tablet TAKE 1 TABLET BY MOUTH TWICE A DAY   rosuvastatin (CRESTOR) 10 MG tablet Take 1 tablet (10 mg total) by mouth daily.   triamcinolone cream (KENALOG) 0.1 % Apply 1 Application topically 2 (two) times daily.   zolpidem (AMBIEN) 10 MG tablet Take 1/2-1 tablet nightly as needed for sleep   acetaminophen (TYLENOL) 325 MG tablet Take 2 tablets (650 mg total) by mouth every 6 (six) hours as needed for mild pain, fever or headache (or  Fever >/= 101). (Patient not taking: Reported on 11/14/2022)   Multiple Vitamin (MULTIVITAMIN) capsule Take 1 capsule by mouth daily. (Patient not taking: Reported on 12/12/2022)   No facility-administered encounter medications on file as of 12/12/2022.    PAST MEDICAL HISTORY: Past Medical History:  Diagnosis Date   Abnormal sensation in ear, right 07/29/2022   Cerebellar infarct (HCC) 09/03/2022   Colitis 10/18/2021   DDD (degenerative disc disease), cervical 2016   Dr. 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   Primary insomnia 11/19/2019   Skin lesion 07/29/2022   Tinnitus of both ears 09/03/2022   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 10/18/2021    PAST SURGICAL HISTORY: Past Surgical History:  Procedure Laterality Date   OTHER SURGICAL HISTORY     cyst removal shoulder    ALLERGIES: No Known Allergies  FAMILY HISTORY: Family History  Problem Relation Age of Onset   Heart disease Father    Hypertension Maternal Grandmother    Heart attack Maternal Grandmother    Hypertension Maternal Grandfather    Heart attack Maternal Grandfather    Hypertension Paternal Grandmother    Heart attack Paternal Grandmother    Stroke Paternal Grandmother    Hypertension Paternal Grandfather    Heart attack Paternal Grandfather    Stroke Maternal Aunt    Diabetes Maternal Uncle    Breast cancer Neg Hx        Mothers family   Cancer Neg Hx        Fathers family    SOCIAL HISTORY: Social History   Tobacco Use   Smoking status: Every Day    Packs/day: 1.00    Years: 28.00    Additional pack years: 0.00    Total pack years: 28.00    Types: Cigarettes   Smokeless tobacco: Never  Vaping Use   Vaping Use: Never used  Substance Use Topics   Alcohol use: No   Drug use: No   Social History   Social History Narrative   Are you right handed or left handed? Right handed   Are you currently employed ? No       Do you live at home alone? Live with husband and Daughter      What type of home do you live in: 1 story or 2 story? One story   Caffeine 3-4 sodas a day          Objective:  Vital Signs:  BP 126/67   Pulse 97   Ht 5\' 3"  (1.6 m)   Wt 160 lb (72.6 kg)   SpO2 98%   BMI 28.34 kg/m   General: No acute distress.  Patient appears well-groomed.   Head:  Normocephalic/atraumatic Neck: supple, no paraspinal tenderness, full range of motion Heart: regular rate and rhythm Lungs: Clear to auscultation bilaterally. Vascular: No carotid  bruits.  Neurological Exam: Mental status: alert and oriented, speech fluent, mild dysarthric, language intact.  Cranial nerves: CN I: not tested CN II: pupils equal, round and reactive to light, visual fields intact CN III, IV, VI:  full range of motion, no nystagmus, no ptosis CN V: facial sensation intact. CN VII: upper and lower face symmetric CN VIII: hearing intact CN IX, X: uvula midline. Bilateral palatal myoclonus.  CN XI: sternocleidomastoid and trapezius muscles intact CN XII: tongue midline  Bulk & Tone: normal Motor:  muscle strength 5/5 throughout Deep Tendon Reflexes:  2+ throughout   Sensation:  Pinprick sensation intact. Finger to nose testing:  Dysmetria in right upper extremity. Normal on left.   Heel to shin:  Dysmetria in right lower extremity. Normal on left.   Gait:  Normal station and stride.   Labs and Imaging review: New results: 10/21/22: Copper wnl Zinc wnl  10/02/22: HbA1c: 7.2 TSH: 1.24 Lipid panel:  Component     Latest Ref Rng 10/02/2022  Cholesterol     <200 mg/dL 161   HDL Cholesterol     > OR = 50 mg/dL 39 (L)   Triglycerides     <150 mg/dL 096   LDL Cholesterol (Calc)     mg/dL (calc) 66   Total CHOL/HDL Ratio     <5.0 (calc) 3.3   Non-HDL Cholesterol (Calc)     <130 mg/dL (calc) 89     CMP: wnl  MRI brain w/wo contrast (09/24/22): FINDINGS:   No evidence of abnormal contrast enhancement. No space-occupying  lesion. The courses of cranial nerves VII and VIII are normal  bilaterally, without evidence of mass or abnormal enhancement. The  cochleae and semicircular canals are normal in appearance bilaterally.  The porus acusticus is normal bilaterally. No masses are seen in the  cerebellopontine angles. The courses of the internal carotid arteries  are normal. The vestibular aqueducts and the remainder of the skull  base are normal.   IMPRESSION: Normal MRI of the IACs.   CTA head (09/24/22): FINDINGS:   EXTRAVASCULAR  FINDINGS: No intracranial mass, mass effect or midline  shift. No intracranial hemorrhage. Basal cisterns, cerebellum, and  brainstem appear unremarkable. Orbits, paranasal sinuses, and  calvarium appear unremarkable.   BRAIN VASCULATURE FINDINGS: No evidence of venous sinus thrombosis or  stenosis. Symmetric appearance of the cavernous sinuses. Normal  positioning of the jugular bulb. Intracranial arterial vasculature is  patent. No aberrant course of the ICA. No evidence of arterial  aneurysm.   IMPRESSION: No significant venous abnormality. No evidence of venous  sinus thrombosis or stenosis.    Previously reviewed results: Lab Results  Component Value Date    HGBA1C 5.2 10/18/2021      Recent Labs       Lab Results  Component Value Date    VITAMINB12 367 01/25/2022      Recent Labs       Lab Results  Component Value Date    TSH 0.845 10/18/2021      Recent Labs[] Expand by Default       Lab Results  Component Value Date    ESRSEDRATE 29 (H) 10/20/2021      Normal or unremarkable: antithrombin 3, anticardiolipin abs, prothombin gene mutation, factor 5 leiden, homocysteine, beta-2 glycoprotein, lupus anticoagulant, p-ANCA, c-ANCA, SSA, SSB, complement, HIV   Imaging: CT head wo contrast (10/18/21): FINDINGS: Brain: There is no acute intracranial hemorrhage, mass effect, or edema. Gray-white differentiation is preserved. There is no extra-axial fluid collection. Ventricles and sulci are within normal limits in size and configuration.   Vascular: No hyperdense vessel or unexpected calcification.   Skull: Calvarium is unremarkable.   Sinuses/Orbits: No acute finding.   Other: None.   IMPRESSION: No acute intracranial abnormality.   MRI brain and MRI brain wo contrast (10/18/21): FINDINGS: MRI HEAD FINDINGS   Brain: Cerebral volume within normal limits. Few scattered subcentimeter foci of T2/FLAIR  hyperintensity noted involving the supratentorial cerebral  white matter, nonspecific, but overall minimal in nature, and of doubtful significance.   Small focus of restricted diffusion seen involving the superior right cerebellum and cerebellar vermis, consistent with an acute ischemic infarct (series 5, images 11-7). No associated hemorrhage or mass effect. No other evidence for acute or subacute ischemia. Gray-white matter differentiation otherwise maintained. No other areas of remote cortical infarction. No acute or chronic intracranial blood products.   No mass lesion or midline shift. No hydrocephalus or extra-axial fluid collection. Pituitary gland suprasellar region within normal limits. Midline structures intact.   Vascular: Major intracranial vascular flow voids are maintained.   Skull and upper cervical spine: Craniocervical junction within normal limits. Bone marrow signal intensity normal. No scalp soft tissue abnormality.   Sinuses/Orbits: Globes orbital soft tissues within normal limits. Scattered mucosal thickening noted about the ethmoidal air cells and maxillary sinuses. Paranasal sinuses are otherwise clear. No mastoid effusion.   Other: None.   MRA HEAD FINDINGS   Anterior circulation: Examination moderately degraded by motion artifact.   Visualized distal cervical segments of the internal carotid arteries are patent with antegrade flow. Petrous, cavernous, and supraclinoid segments patent without hemodynamically significant stenosis or other abnormality. A1 segments patent bilaterally. Normal anterior communicating artery complex. Both ACAs patent to their distal aspects without stenosis. M1 segments widely patent bilaterally. Left M1 is fenestrated distally. Normal MCA bifurcations. Distal MCA branches perfused and symmetric.   Posterior circulation: Visualized V4 segments widely patent. Right PICA patent at its origin. Left PICA not well seen. Basilar patent to its distal aspect without stenosis. Superior  cerebral arteries patent bilaterally. Both PCAs primarily supplied via the basilar well perfused to their distal aspects.   Anatomic variants: None significant.  No intracranial aneurysm.   IMPRESSION: MRI HEAD IMPRESSION:   1. Small acute ischemic nonhemorrhagic infarct involving the superior right cerebellum. 2. Otherwise normal brain MRI for age.   MRA HEAD IMPRESSION:   1. Motion degraded exam. 2. Negative intracranial MRA. No large vessel occlusion, hemodynamically significant stenosis, or other acute vascular abnormality.   CTA neck (10/19/21): FINDINGS: Aortic arch: The aortic arch is unremarkable. There is a common origin of the brachiocephalic and left common carotid arteries, a normal variant. The subclavian arteries are patent to the level imaged.   Right carotid system: Right common, internal, and external carotid arteries are patent, without hemodynamically significant stenosis or occlusion. There is no dissection or aneurysm.   Left carotid system: The left common, internal, and external carotid arteries are patent, without hemodynamically significant stenosis or occlusion. There is no dissection or aneurysm.   Vertebral arteries: The vertebral arteries are widely patent, without hemodynamically significant stenosis or occlusion. There is no evidence of dissection or aneurysm.   Skeleton: There is no acute osseous abnormality or aggressive osseous lesion. There is no significant degenerative change in the cervical spine. There is no visible canal hematoma.   Other neck: The soft tissues are unremarkable.   Upper chest: The imaged lung apices are clear.   IMPRESSION: Normal CTA neck. No hemodynamically significant stenosis, occlusion, dissection, or aneurysm.   Echo (10/19/21): IMPRESSIONS:  1. Left ventricular ejection fraction, by estimation, is 55 to 60%. The  left ventricle has normal function. The left ventricle has no regional  wall motion  abnormalities. Left ventricular diastolic parameters were  normal.   2. Right ventricular systolic function is normal. The right ventricular  size is normal. Tricuspid regurgitation signal is inadequate for  assessing  PA pressure.   3. The mitral valve is grossly normal. Mild mitral valve regurgitation.   4. The aortic valve is tricuspid. Aortic valve regurgitation is not  visualized. No aortic stenosis is present. Aortic valve mean gradient  measures 3.0 mmHg.   5. The inferior vena cava is normal in size with greater than 50%  respiratory variability, suggesting right atrial pressure of 3 mmHg.   Assessment/Plan:  This is Nancy Cochran, a 47 y.o. 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 It has improved with magnesium that was recommended for muscle cramps/spasms. 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

## 2022-12-12 ENCOUNTER — Ambulatory Visit: Payer: Managed Care, Other (non HMO) | Admitting: Neurology

## 2022-12-12 ENCOUNTER — Encounter: Payer: Self-pay | Admitting: Neurology

## 2022-12-12 VITALS — BP 126/67 | HR 97 | Ht 63.0 in | Wt 160.0 lb

## 2022-12-12 DIAGNOSIS — R252 Cramp and spasm: Secondary | ICD-10-CM

## 2022-12-12 DIAGNOSIS — I6789 Other cerebrovascular disease: Secondary | ICD-10-CM

## 2022-12-12 DIAGNOSIS — E785 Hyperlipidemia, unspecified: Secondary | ICD-10-CM | POA: Diagnosis not present

## 2022-12-12 DIAGNOSIS — G253 Myoclonus: Secondary | ICD-10-CM | POA: Diagnosis not present

## 2022-12-12 DIAGNOSIS — G43809 Other migraine, not intractable, without status migrainosus: Secondary | ICD-10-CM

## 2022-12-12 DIAGNOSIS — Z8673 Personal history of transient ischemic attack (TIA), and cerebral infarction without residual deficits: Secondary | ICD-10-CM

## 2022-12-12 NOTE — Patient Instructions (Signed)
-  Continue magnesium oxide 400 mg every other day -Continue aspirin 81 mg daily -Continue Crestor 10 mg daily  Follow up in 1 year or sooner if needed.  Please let me know if you have any questions or concerns in the meantime.   The physicians and staff at [facility] are committed to providing excellent care. You may receive a survey requesting feedback about your experience at our office. We strive to receive "very good" responses to the survey questions. If you feel that your experience would prevent you from giving the office a "very good " response, please contact our office to try to remedy the situation. We may be reached at [phone]. Thank you for taking the time out of your busy day to complete the survey.  Jacquelyne Balint, MD [facility]

## 2022-12-16 ENCOUNTER — Telehealth: Payer: Self-pay | Admitting: Family Medicine

## 2022-12-16 NOTE — Telephone Encounter (Signed)
Pt called.  Her Free [facility] [phone]. Her insurance sent a form for completion to make coverage determination before 4/18 and have not heard from [place] as yet.

## 2022-12-16 NOTE — Telephone Encounter (Signed)
Have not seen a form for this.  Please clarify/have them re-send form.    Thanks

## 2022-12-16 NOTE — Telephone Encounter (Signed)
Can you check and see if you got this form. It looks like a prior Berkley Harvey is being requested for coverage determination. Thanks in advance.

## 2022-12-18 ENCOUNTER — Telehealth: Payer: Self-pay

## 2022-12-18 NOTE — Telephone Encounter (Addendum)
Initiated Prior authorization [id number] Libre 3 Sensor Via: called [employer] psychotherapist Prior Authorization  [id number] [phone] fax only Case/Key: n/a Status: Pending as of 12/18/22 Reason:called pt to give an update on the status of prior authorization for freestyle libre sensors , pt was agreeable to the possible outcome of denial due to not being able to meet the medical criteria for this medication and lack of updated chart notes, pt was last seen 07/29/22. with no mention of insulin dependent dubieties requiring testing up to 4 times a day.  call was placed a 4:30pm 01/09/23. Notified Pt via: Mychart

## 2022-12-23 NOTE — Telephone Encounter (Signed)
Routing to provider as an FYI

## 2022-12-24 ENCOUNTER — Ambulatory Visit: Payer: Managed Care, Other (non HMO) | Admitting: Nutrition

## 2022-12-26 ENCOUNTER — Ambulatory Visit: Payer: Managed Care, Other (non HMO) | Admitting: Nutrition

## 2022-12-30 ENCOUNTER — Telehealth: Payer: Self-pay | Admitting: Family Medicine

## 2022-12-30 NOTE — Telephone Encounter (Signed)
Patient is requesting that her son establish care with you

## 2022-12-30 NOTE — Telephone Encounter (Signed)
Pt called. Pt states insurance has not gotten anything from [place] about PA for [employer] 3 Sensor. Fax# [phone].

## 2023-01-07 NOTE — Telephone Encounter (Signed)
Pt called. She is following up on PA.

## 2023-01-09 ENCOUNTER — Ambulatory Visit (INDEPENDENT_AMBULATORY_CARE_PROVIDER_SITE_OTHER): Payer: Managed Care, Other (non HMO) | Admitting: Sports Medicine

## 2023-01-09 ENCOUNTER — Encounter: Payer: Self-pay | Admitting: Sports Medicine

## 2023-01-09 ENCOUNTER — Encounter: Payer: Self-pay | Admitting: Neurology

## 2023-01-09 DIAGNOSIS — M5030 Other cervical disc degeneration, unspecified cervical region: Secondary | ICD-10-CM | POA: Diagnosis not present

## 2023-01-09 MED ORDER — GABAPENTIN 300 MG PO CAPS
300.0000 mg | ORAL_CAPSULE | Freq: Every day | ORAL | 3 refills | Status: DC
Start: 2023-01-09 — End: 2023-02-20

## 2023-01-09 MED ORDER — DEXAMETHASONE 4 MG PO TABS
4.0000 mg | ORAL_TABLET | Freq: Three times a day (TID) | ORAL | 0 refills | Status: DC
Start: 2023-01-09 — End: 2023-01-23

## 2023-01-09 NOTE — Assessment & Plan Note (Addendum)
Nancy Cochran returns, she is a pleasant 47 year old female with known cervical DDD, we treated her back in 2021 conservatively, she has had some hurdles along the way, she had a cerebellar infarct. She does have some persistent right-sided hemiparesis but otherwise is improving. She has developed increasing neck pain right-sided with periscapular radiation, radiation down the arm but she is not exactly sure which fingers it goes to, she is interested in aggressive treatment. We will go and do a course of steroids (Decadron, she did not tolerate prednisone) as she does have a motorcycle ride coming up over the next week, I will also go and order a old right-sided C6-C7 interlaminar epidural, she will get approval from her neurologist, off of aspirin 10 days prior. Adding gabapentin for nighttime use, she will continue her home physical therapy and return to see me 6 weeks after the injection.

## 2023-01-09 NOTE — Progress Notes (Signed)
    Procedures performed today:    None.  Independent interpretation of notes and tests performed by another provider:   None.  Brief History, Exam, Impression, and Recommendations:    DDD (degenerative disc disease), cervical Nancy Cochran returns, she is a pleasant 46 year old female with known cervical DDD, we treated her back in 2021 conservatively, she has had some hurdles along the way, she had a cerebellar infarct. She does have some persistent right-sided hemiparesis but otherwise is improving. She has developed increasing neck pain right-sided with periscapular radiation, radiation down the arm but she is not exactly sure which fingers it goes to, she is interested in aggressive treatment. We will go and do a course of steroids (Decadron, she did not tolerate prednisone) as she does have a motorcycle ride coming up over the next week, I will also go and order a old right-sided C6-C7 interlaminar epidural, she will get approval from her neurologist, off of aspirin 10 days prior. Adding gabapentin for nighttime use, she will continue her home physical therapy and return to see me 6 weeks after the injection.    ____________________________________________ Ihor Austin. Benjamin Stain, M.D., ABFM., CAQSM., AME. Primary Care and Sports Medicine [facility]  Adjunct Professor of Family Medicine  [place] [employer]  Restaurant manager, fast food

## 2023-01-16 ENCOUNTER — Encounter: Payer: Self-pay | Admitting: Family Medicine

## 2023-01-16 ENCOUNTER — Encounter: Payer: Self-pay | Admitting: Sports Medicine

## 2023-01-16 ENCOUNTER — Telehealth: Payer: Managed Care, Other (non HMO) | Admitting: Family Medicine

## 2023-01-16 VITALS — Ht 63.0 in | Wt 158.0 lb

## 2023-01-16 DIAGNOSIS — B370 Candidal stomatitis: Secondary | ICD-10-CM

## 2023-01-16 MED ORDER — NYSTATIN 100000 UNIT/ML MT SUSP: 5.0000 mL | Freq: Four times a day (QID) | OROMUCOSAL | 0 refills | Status: AC

## 2023-01-16 NOTE — Progress Notes (Signed)
Nancy Cochran - 47 y.o. female MRN [id number]  Date of birth: 15-Nov-1975    All issues noted in this document were discussed and addressed.  No physical exam was performed (except for noted visual exam findings with Video Visits).  I discussed the limitations of evaluation and management by telemedicine and the availability of in person appointments. The patient expressed understanding and agreed to proceed.  I connected withNAME@ on 01/16/23 at 10:30 AM EDT by a video enabled telemedicine application and verified that I am speaking with the correct person using two identifiers.  Present at visit: Everrett Coombe, DO [facility]   Patient Location: Home [address] [place] [address]   Provider location:   [facility]  Chief Complaint  Patient presents with   Mouth Lesions    HPI  Nancy Cochran is a 47 y.o. female who presents via audio/video conferencing for a telehealth visit today.  She reports that mouth is sore and raw feeling.  She has white patches on the inside of her mouth along the cheeks and tongue.  Started on decadron recently for neck pain.  Denies ulcerations, bleeding, fever or chills.    ROS:  A comprehensive ROS was completed and negative except as noted per HPI  Past Medical History:  Diagnosis Date   Abnormal sensation in ear, right 07/29/2022   Cerebellar infarct (HCC) 09/03/2022   Colitis 10/18/2021   DDD (degenerative disc disease), cervical 2016   Dr. 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    Past Surgical History:  Procedure Laterality Date   OTHER SURGICAL  HISTORY     cyst removal shoulder    Family History  Problem Relation Age of Onset   Heart disease Father    Hypertension Maternal Grandmother    Heart attack Maternal Grandmother    Hypertension Maternal Grandfather    Heart attack Maternal Grandfather    Hypertension Paternal Grandmother    Heart attack Paternal Grandmother    Stroke Paternal Grandmother    Hypertension Paternal Grandfather    Heart attack Paternal Grandfather    Stroke Maternal Aunt    Diabetes Maternal Uncle    Breast cancer Neg Hx        Mothers family   Cancer Neg Hx        Fathers family    Social History   Socioeconomic History   Marital status: Married    Spouse name: Not on file   Number of children: 2   Years of education: Not on file   Highest education level: Not on file  Occupational History   Occupation: Lawyer  Tobacco Use   Smoking status: Every Day    Packs/day: 1.00    Years: 28.00    Additional pack years: 0.00    Total pack years: 28.00    Types: Cigarettes   Smokeless tobacco: Never  Vaping Use   Vaping Use: Never used  Substance and Sexual Activity   Alcohol use: No   Drug use: No   Sexual activity: Yes    Partners: Male    Birth control/protection:  Injection  Other Topics Concern   Not on file  Social History Narrative   Are you right handed or left handed? Right handed   Are you currently employed ? No       Do you live at home alone? Live with husband and Daughter      What type of home do you live in: 1 story or 2 story? One story   Caffeine 3-4 sodas a day       Social Determinants of Corporate investment banker Strain: Not on file  Food Insecurity: Not on file  Transportation Needs: Not on file  Physical Activity: Not on file  Stress: Not on file  Social Connections: Not on file  Intimate Partner Violence: Not on file     Current Outpatient Medications:    nystatin (MYCOSTATIN) 100000 UNIT/ML suspension, Take 5 mLs (500,000 Units  total) by mouth 4 (four) times daily for 7 days. Swish around entire mouth and swallow., Disp: 150 mL, Rfl: 0   acetaminophen (TYLENOL) 325 MG tablet, Take 2 tablets (650 mg total) by mouth every 6 (six) hours as needed for mild pain, fever or headache (or Fever >/= 101)., Disp: 12 tablet, Rfl: 0   albuterol (VENTOLIN HFA) 108 (90 Base) MCG/ACT inhaler, Inhale 2 puffs into the lungs every 6 (six) hours as needed for wheezing or shortness of breath., Disp: 18 g, Rfl: 3   amitriptyline (ELAVIL) 25 MG tablet, TAKE 1 TABLET BY MOUTH EVERYDAY AT BEDTIME, Disp: 90 tablet, Rfl: 3   aspirin EC 81 MG EC tablet, Take 1 tablet (81 mg total) by mouth daily with breakfast. Swallow whole., Disp: 30 tablet, Rfl: 11   Continuous Blood Gluc Sensor (FREESTYLE LIBRE 3 SENSOR) MISC, Place 1 sensor on the skin every 14 days. Use to check glucose continuously.  Dx: E11.69, Disp: 2 each, Rfl: 6   dexamethasone (DECADRON) 4 MG tablet, Take 1 tablet (4 mg total) by mouth 3 (three) times daily., Disp: 15 tablet, Rfl: 0   gabapentin (NEURONTIN) 300 MG capsule, Take 1-2 capsules (300-600 mg total) by mouth at bedtime., Disp: 90 capsule, Rfl: 3   magnesium oxide (MAG-OX) 400 (240 Mg) MG tablet, Take 400 mg by mouth daily., Disp: , Rfl:    medroxyPROGESTERone (DEPO-PROVERA) 150 MG/ML injection, Inject 150 mg into the muscle every 3 (three) months., Disp: , Rfl:    metFORMIN (GLUCOPHAGE-XR) 500 MG 24 hr tablet, Take 1 tablet (500 mg total) by mouth daily with breakfast., Disp: 90 tablet, Rfl: 1   metoprolol tartrate (LOPRESSOR) 25 MG tablet, TAKE 1 TABLET BY MOUTH TWICE A DAY, Disp: 180 tablet, Rfl: 3   Multiple Vitamin (MULTIVITAMIN) capsule, Take 1 capsule by mouth daily., Disp: , Rfl:    rosuvastatin (CRESTOR) 10 MG tablet, Take 1 tablet (10 mg total) by mouth daily., Disp: 90 tablet, Rfl: 3   triamcinolone cream (KENALOG) 0.1 %, Apply 1 Application topically 2 (two) times daily., Disp: 80 g, Rfl: 3   zolpidem (AMBIEN) 10 MG  tablet, Take 1/2-1 tablet nightly as needed for sleep, Disp: 30 tablet, Rfl: 0  EXAM:  VITALS per patient if applicable: Ht 5\' 3"  (1.6 m)   Wt 158 lb (71.7 kg)   BMI 27.99 kg/m   GENERAL: alert, oriented, appears well and in no acute distress  HEENT: atraumatic, conjunttiva clear, no obvious abnormalities on inspection of external nose and ears  NECK: normal movements of the head and neck  LUNGS: on inspection no signs of  respiratory distress, breathing rate appears normal, no obvious gross SOB, gasping or wheezing  CV: no obvious cyanosis  MS: moves all visible extremities without noticeable abnormality  PSYCH/NEURO: pleasant and cooperative, no obvious depression or anxiety, speech and thought processing grossly intact  ASSESSMENT AND PLAN:  Discussed the following assessment and plan:  Thrush Adding nystatin suspension for up to 7 days.  Instructed to let me know if symptoms are not improving.      I discussed the assessment and treatment plan with the patient. The patient was provided an opportunity to ask questions and all were answered. The patient agreed with the plan and demonstrated an understanding of the instructions.   The patient was advised to call back or seek an in-person evaluation if the symptoms worsen or if the condition fails to improve as anticipated.    Everrett Coombe, DO

## 2023-01-16 NOTE — Progress Notes (Signed)
Developed thrush from medicine [facility] by Dr. Karie Schwalbe.   Started 1-2 days after starting medication.

## 2023-01-16 NOTE — Assessment & Plan Note (Signed)
Adding nystatin suspension for up to 7 days.  Instructed to let me know if symptoms are not improving.

## 2023-01-23 ENCOUNTER — Ambulatory Visit (INDEPENDENT_AMBULATORY_CARE_PROVIDER_SITE_OTHER): Payer: Managed Care, Other (non HMO) | Admitting: Family Medicine

## 2023-01-23 ENCOUNTER — Encounter: Payer: Self-pay | Admitting: Family Medicine

## 2023-01-23 VITALS — BP 110/73 | HR 98 | Ht 63.0 in | Wt 159.0 lb

## 2023-01-23 DIAGNOSIS — R208 Other disturbances of skin sensation: Secondary | ICD-10-CM

## 2023-01-23 DIAGNOSIS — E088 Diabetes mellitus due to underlying condition with unspecified complications: Secondary | ICD-10-CM

## 2023-01-23 DIAGNOSIS — I7300 Raynaud's syndrome without gangrene: Secondary | ICD-10-CM

## 2023-01-23 DIAGNOSIS — G43809 Other migraine, not intractable, without status migrainosus: Secondary | ICD-10-CM

## 2023-01-23 DIAGNOSIS — M791 Myalgia, unspecified site: Secondary | ICD-10-CM | POA: Diagnosis not present

## 2023-01-23 DIAGNOSIS — E1169 Type 2 diabetes mellitus with other specified complication: Secondary | ICD-10-CM

## 2023-01-23 DIAGNOSIS — F411 Generalized anxiety disorder: Secondary | ICD-10-CM

## 2023-01-23 LAB — POCT GLYCOSYLATED HEMOGLOBIN (HGB A1C): HbA1c, POC (controlled diabetic range): 6.2 % (ref 0.0–7.0)

## 2023-01-23 MED ORDER — DULOXETINE HCL 30 MG PO CPEP: 30.0000 mg | ORAL_CAPSULE | Freq: Every day | ORAL | 3 refills | Status: DC

## 2023-01-23 NOTE — Progress Notes (Signed)
Nancy Cochran - 47 y.o. female MRN [id number]  Date of birth: 10-Jun-1976  Subjective Chief Complaint  Patient presents with   Diabetes    HPI Nancy Cochran is a 47 y.o. female here today for follow up visit.  Recently with rash in her mouth, treated for thrush. Reports that this is pretty much resolved at this point..   Seen by Dr. Benjamin Stain recently for degenerative disc disease of the neck.  Recently completed course of Decadron.  Gabapentin was added as well.  She does report that these did provide some relief.  She was started that she continues to have pain and several muscles and joints.  Her inflammatory markers have been elevated in the past.  This has not been rechecked in about a year.  She has noticed that her toes turn blue at times.  Has had some increased anxiety at times.  She does take Elavil to help with headaches.  She does not feel like this has had any effect on her mood.  Diabetes is managed with metformin.  Tolerating well at current strength.  She has tolerated metformin well at current strength.  ROS:  A comprehensive ROS was completed and negative except as noted per HPI   No Known Allergies  Past Medical History:  Diagnosis Date   Abnormal sensation in ear, right 07/29/2022   Cerebellar infarct (HCC) 09/03/2022   Colitis 10/18/2021   DDD (degenerative disc disease), cervical 2016   Dr. 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    Past Surgical History:  Procedure Laterality Date   OTHER SURGICAL HISTORY     cyst removal shoulder    Social History   Socioeconomic  History   Marital status: Married    Spouse name: Not on file   Number of children: 2   Years of education: Not on file   Highest education level: Not on file  Occupational History   Occupation: Lawyer  Tobacco Use   Smoking status: Every Day    Packs/day: 1.00    Years: 28.00    Additional pack years: 0.00    Total pack years: 28.00    Types: Cigarettes   Smokeless tobacco: Never  Vaping Use   Vaping Use: Never used  Substance and Sexual Activity   Alcohol use: No   Drug use: No   Sexual activity: Yes    Partners: Male    Birth control/protection: Injection  Other Topics Concern   Not on file  Social History Narrative   Are you right handed or left handed? Right handed   Are you currently employed ? No       Do you live at home alone? Live with husband and Daughter      What type of home do you live in: 1 story or 2 story? One story   Caffeine 3-4 sodas a day       Social Determinants of Health   Financial Resource Strain: Not on file  Food Insecurity: Not on file  Transportation Needs: Not on file  Physical Activity: Not on file  Stress: Not  on file  Social Connections: Not on file    Family History  Problem Relation Age of Onset   Heart disease Father    Hypertension Maternal Grandmother    Heart attack Maternal Grandmother    Hypertension Maternal Grandfather    Heart attack Maternal Grandfather    Hypertension Paternal Grandmother    Heart attack Paternal Grandmother    Stroke Paternal Grandmother    Hypertension Paternal Grandfather    Heart attack Paternal Grandfather    Stroke Maternal Aunt    Diabetes Maternal Uncle    Breast cancer Neg Hx        Mothers family   Cancer Neg Hx        Fathers family    Health Maintenance  Topic Date Due   FOOT EXAM  Never done   OPHTHALMOLOGY EXAM  Never done   Diabetic kidney evaluation - Urine ACR  Never done   DTaP/Tdap/Td (1 - Tdap) Never done   PAP SMEAR-Modifier  07/29/2022   Hepatitis  C Screening  01/26/2023 (Originally 06/26/1996)   INFLUENZA VACCINE  03/13/2023   HEMOGLOBIN A1C  07/25/2023   Diabetic kidney evaluation - eGFR measurement  10/03/2023   MAMMOGRAM  10/26/2023   HIV Screening  Completed   HPV VACCINES  Aged Out   COVID-19 Vaccine  Discontinued     ----------------------------------------------------------------------------------------------------------------------------------------------------------------------------------------------------------------- Physical Exam BP 110/73 (BP Location: Left Arm, Patient Position: Sitting, Cuff Size: Normal)   Pulse 98   Ht 5\' 3"  (1.6 m)   Wt 159 lb (72.1 kg)   SpO2 98%   BMI 28.17 kg/m   Physical Exam Constitutional:      Appearance: Normal appearance.  HENT:     Head: Normocephalic and atraumatic.  Eyes:     General: No scleral icterus. Neurological:     Mental Status: She is alert.  Psychiatric:        Mood and Affect: Mood normal.        Behavior: Behavior normal.     ------------------------------------------------------------------------------------------------------------------------------------------------------------------------------------------------------------------- Assessment and Plan  Diabetes mellitus due to underlying condition with unspecified complications (HCC) Lab Results  Component Value Date   HGBA1C 6.2 01/23/2023  Blood sugars remain well-controlled.  Continue metformin at current strength.  Muscle pain She has had muscle pain with hyperalgesia as well as Raynaud's.  Elevated inflammatory markers previously.  Checking ESR, CRP, CK and ANA today.  Migraines   Continue Elavil at current strength.   GAD (generalized anxiety disorder) Discussed adding trial of Cymbalta given her chronic pain as well as anxiety.  Starting 30 mg daily.   Meds ordered this encounter  Medications   DULoxetine (CYMBALTA) 30 MG capsule    Sig: Take 1 capsule (30 mg total) by mouth daily.     Dispense:  90 capsule    Refill:  3    Return in about 2 months (around 03/25/2023) for F/u Pain/Mood.    This visit occurred during the SARS-CoV-2 public health emergency.  Safety protocols were in place, including screening questions prior to the visit, additional usage of staff PPE, and extensive cleaning of exam room while observing appropriate contact time as indicated for disinfecting solutions.

## 2023-01-23 NOTE — Patient Instructions (Signed)
Start duloxetine 30mg  daily.  See me again in 2 months.

## 2023-01-24 LAB — CK: Total CK: 71 U/L (ref 29–143)

## 2023-01-24 LAB — B. BURGDORFI ANTIBODIES: B burgdorferi Ab IgG+IgM: <0.9 index

## 2023-01-25 LAB — ANA,IFA RA DIAG PNL W/RFLX TIT/PATN
Anti Nuclear Antibody (ANA): POSITIVE — AB
Cyclic Citrullin Peptide Ab: <16 UNITS
Rheumatoid fact SerPl-aCnc: <10 IU/mL (ref <14)

## 2023-01-25 LAB — ANTI-NUCLEAR AB-TITER (ANA TITER): ANA Titer 1: 1:40 {titer} — ABNORMAL HIGH

## 2023-01-25 LAB — C-REACTIVE PROTEIN: CRP: 5.3 mg/L (ref <8.0)

## 2023-01-25 LAB — SEDIMENTATION RATE: Sed Rate: 39 mm/h — ABNORMAL HIGH (ref 0–20)

## 2023-01-26 DIAGNOSIS — M791 Myalgia, unspecified site: Secondary | ICD-10-CM | POA: Insufficient documentation

## 2023-01-26 NOTE — Assessment & Plan Note (Signed)
Discussed adding trial of Cymbalta given her chronic pain as well as anxiety.  Starting 30 mg daily.

## 2023-01-26 NOTE — Assessment & Plan Note (Signed)
She has had muscle pain with hyperalgesia as well as Raynaud's.  Elevated inflammatory markers previously.  Checking ESR, CRP, CK and ANA today.

## 2023-01-26 NOTE — Assessment & Plan Note (Signed)
   Continue Elavil at current strength.

## 2023-01-26 NOTE — Assessment & Plan Note (Signed)
Lab Results  Component Value Date   HGBA1C 6.2 01/23/2023  Blood sugars remain well-controlled.  Continue metformin at current strength.

## 2023-01-29 NOTE — Discharge Instructions (Signed)

## 2023-01-30 ENCOUNTER — Ambulatory Visit
Admission: RE | Admit: 2023-01-30 | Discharge: 2023-01-30 | Disposition: A | Discharge status: Home / Self Care | Payer: Managed Care, Other (non HMO) | Source: Ambulatory Visit | Attending: Sports Medicine | Admitting: Sports Medicine

## 2023-01-30 DIAGNOSIS — M5030 Other cervical disc degeneration, unspecified cervical region: Secondary | ICD-10-CM

## 2023-01-30 MED ORDER — IOPAMIDOL (ISOVUE-M 300) INJECTION 61%
1.0000 mL | Freq: Once | INTRAMUSCULAR | Status: AC | PRN
  Administered 2023-01-30: 1 mL via EPIDURAL

## 2023-01-30 MED ORDER — TRIAMCINOLONE ACETONIDE 40 MG/ML IJ SUSP (RADIOLOGY)
60.0000 mg | Freq: Once | INTRAMUSCULAR | Status: AC
  Administered 2023-01-30: 60 mg via EPIDURAL

## 2023-01-31 ENCOUNTER — Other Ambulatory Visit: Payer: Self-pay | Admitting: Family Medicine

## 2023-01-31 ENCOUNTER — Encounter: Payer: Self-pay | Admitting: Family Medicine

## 2023-01-31 DIAGNOSIS — A530 Latent syphilis, unspecified as early or late: Secondary | ICD-10-CM

## 2023-01-31 DIAGNOSIS — R700 Elevated erythrocyte sedimentation rate: Secondary | ICD-10-CM

## 2023-02-03 ENCOUNTER — Other Ambulatory Visit: Payer: Self-pay | Admitting: Family Medicine

## 2023-02-03 DIAGNOSIS — R700 Elevated erythrocyte sedimentation rate: Secondary | ICD-10-CM

## 2023-02-10 ENCOUNTER — Encounter: Payer: Self-pay | Admitting: Family Medicine

## 2023-02-19 ENCOUNTER — Encounter (INDEPENDENT_AMBULATORY_CARE_PROVIDER_SITE_OTHER): Payer: Managed Care, Other (non HMO) | Admitting: Sports Medicine

## 2023-02-19 DIAGNOSIS — M5030 Other cervical disc degeneration, unspecified cervical region: Secondary | ICD-10-CM | POA: Diagnosis not present

## 2023-02-19 NOTE — Telephone Encounter (Signed)
I spent 5 total minutes of online digital evaluation and management services in this patient-initiated request for online care. 

## 2023-02-20 MED ORDER — PREGABALIN 75 MG PO CAPS
ORAL_CAPSULE | ORAL | 3 refills | Status: DC
Start: 2023-02-20 — End: 2023-06-10

## 2023-02-20 NOTE — Addendum Note (Signed)
Addended by: Monica Becton on: 02/20/2023 01:00 PM   Modules accepted: Orders

## 2023-02-20 NOTE — Telephone Encounter (Addendum)
I spent 11 total minutes of online digital evaluation and management services in this patient-initiated request for online care. 

## 2023-02-25 NOTE — Addendum Note (Signed)
Addended by: Monica Becton on: 02/25/2023 02:06 PM   Modules accepted: Orders

## 2023-02-26 ENCOUNTER — Other Ambulatory Visit: Payer: Self-pay

## 2023-02-26 ENCOUNTER — Emergency Department (HOSPITAL_COMMUNITY): Payer: Managed Care, Other (non HMO)

## 2023-02-26 ENCOUNTER — Emergency Department (HOSPITAL_COMMUNITY)
Admission: EM | Admit: 2023-02-26 | Discharge: 2023-02-26 | Disposition: A | Discharge status: Home / Self Care | Payer: Managed Care, Other (non HMO) | Attending: Emergency Medicine | Admitting: Emergency Medicine

## 2023-02-26 DIAGNOSIS — R42 Dizziness and giddiness: Secondary | ICD-10-CM | POA: Diagnosis present

## 2023-02-26 DIAGNOSIS — I10 Essential (primary) hypertension: Secondary | ICD-10-CM | POA: Insufficient documentation

## 2023-02-26 DIAGNOSIS — E119 Type 2 diabetes mellitus without complications: Secondary | ICD-10-CM | POA: Diagnosis not present

## 2023-02-26 DIAGNOSIS — Z7984 Long term (current) use of oral hypoglycemic drugs: Secondary | ICD-10-CM | POA: Insufficient documentation

## 2023-02-26 DIAGNOSIS — Z79899 Other long term (current) drug therapy: Secondary | ICD-10-CM | POA: Diagnosis not present

## 2023-02-26 DIAGNOSIS — Z7982 Long term (current) use of aspirin: Secondary | ICD-10-CM | POA: Diagnosis not present

## 2023-02-26 LAB — CBC
HCT: 43.4 % (ref 36.0–46.0)
Hemoglobin: 13.9 g/dL (ref 12.0–15.0)
MCH: 28.9 pg (ref 26.0–34.0)
MCHC: 32 g/dL (ref 30.0–36.0)
MCV: 90.2 fL (ref 80.0–100.0)
Platelets: 303 10*3/uL (ref 150–400)
RBC: 4.81 MIL/uL (ref 3.87–5.11)
RDW: 13.6 % (ref 11.5–15.5)
WBC: 12.8 10*3/uL — ABNORMAL HIGH (ref 4.0–10.5)
nRBC: 0 % (ref 0.0–0.2)

## 2023-02-26 LAB — COMPREHENSIVE METABOLIC PANEL
ALT: 20 U/L (ref 0–44)
AST: 16 U/L (ref 15–41)
Albumin: 3.6 g/dL (ref 3.5–5.0)
Alkaline Phosphatase: 71 U/L (ref 38–126)
Anion gap: 5 (ref 5–15)
BUN: 13 mg/dL (ref 6–20)
CO2: 22 mmol/L (ref 22–32)
Calcium: 8.3 mg/dL — ABNORMAL LOW (ref 8.9–10.3)
Chloride: 109 mmol/L (ref 98–111)
Creatinine, Ser: 0.73 mg/dL (ref 0.44–1.00)
GFR, Estimated: >60 mL/min (ref >60)
Glucose, Bld: 103 mg/dL — ABNORMAL HIGH (ref 70–99)
Potassium: 4 mmol/L (ref 3.5–5.1)
Sodium: 136 mmol/L (ref 135–145)
Total Bilirubin: 0.3 mg/dL (ref 0.3–1.2)
Total Protein: 6.7 g/dL (ref 6.5–8.1)

## 2023-02-26 LAB — CBG MONITORING, ED: Glucose-Capillary: 93 mg/dL (ref 70–99)

## 2023-02-26 MED ORDER — ONDANSETRON HCL 4 MG PO TABS: 4.0000 mg | ORAL_TABLET | Freq: Four times a day (QID) | ORAL | 0 refills | Status: AC

## 2023-02-26 MED ORDER — GADOBUTROL 1 MMOL/ML IV SOLN
7.0000 mL | Freq: Once | INTRAVENOUS | Status: AC | PRN
  Administered 2023-02-26: 7 mL via INTRAVENOUS

## 2023-02-26 MED ORDER — MECLIZINE HCL 25 MG PO TABS
25.0000 mg | ORAL_TABLET | Freq: Once | ORAL | Status: AC
  Administered 2023-02-26: 25 mg via ORAL
  Filled 2023-02-26: qty 1

## 2023-02-26 MED ORDER — SODIUM CHLORIDE 0.9 % IV BOLUS
1000.0000 mL | Freq: Once | INTRAVENOUS | Status: AC
  Administered 2023-02-26: 1000 mL via INTRAVENOUS

## 2023-02-26 MED ORDER — MECLIZINE HCL 25 MG PO TABS: 25.0000 mg | ORAL_TABLET | Freq: Three times a day (TID) | ORAL | 0 refills | Status: AC | PRN

## 2023-02-26 MED ORDER — ONDANSETRON 4 MG PO TBDP
4.0000 mg | ORAL_TABLET | Freq: Once | ORAL | Status: AC
  Administered 2023-02-26: 4 mg via ORAL
  Filled 2023-02-26: qty 1

## 2023-02-26 NOTE — ED Provider Notes (Signed)
[facility] AT [facility] Provider Note   CSN: [id number] Arrival date & time: 02/26/23  1914     History  Chief Complaint  Patient presents with   Dizziness    Nancy Cochran is a 47 y.o. female with a history of cerebellar infarct, diabetes mellitus, and obesity who presents to the ED today for dizziness. Patient reports that she woke up this morning with pain at the right temple and dizziness.  She took a Tylenol which relieved the pain but has had persistent dizziness and nausea since.  She feels as if her "head is spinning." Dizziness is worse when she moves her head. Patient reports that about a year ago she had a stroke and she had associated dizziness. With her previous stroke, she has  At this time, patient denies new weakness, loss of sensation, slurring of speech, changes to her vision or hearing. Patient's husband is at bedside and confirms her speech is at baseline. She is able to ambulate.  Aside from the Tylenol, patient has not taken any medication for her symptoms.     Home Medications Prior to Admission medications   Medication Sig Start Date End Date Taking? Authorizing Provider  meclizine (ANTIVERT) 25 MG tablet Take 1 tablet (25 mg total) by mouth 3 (three) times daily as needed for dizziness. 02/26/23 03/28/23 Yes Maxwell Marion, PA-C  ondansetron (ZOFRAN) 4 MG tablet Take 1 tablet (4 mg total) by mouth every 6 (six) hours. 02/26/23 03/28/23 Yes Maxwell Marion, PA-C  acetaminophen (TYLENOL) 325 MG tablet Take 2 tablets (650 mg total) by mouth every 6 (six) hours as needed for mild pain, fever or headache (or Fever >/= 101). 10/20/21   Shon Hale, MD  albuterol (VENTOLIN HFA) 108 (90 Base) MCG/ACT inhaler Inhale 2 puffs into the lungs every 6 (six) hours as needed for wheezing or shortness of breath. 10/20/21   Shon Hale, MD  amitriptyline (ELAVIL) 25 MG tablet TAKE 1 TABLET BY MOUTH EVERYDAY AT BEDTIME 10/08/22   Everrett Coombe, DO   aspirin EC 81 MG EC tablet Take 1 tablet (81 mg total) by mouth daily with breakfast. Swallow whole. 10/21/21   Shon Hale, MD  Continuous Blood Gluc Sensor (FREESTYLE LIBRE 3 SENSOR) MISC Place 1 sensor on the skin every 14 days. Use to check glucose continuously.  Dx: E11.69 Patient not taking: Reported on 01/23/2023 11/15/22   Everrett Coombe, DO  DULoxetine (CYMBALTA) 30 MG capsule Take 1 capsule (30 mg total) by mouth daily. 01/23/23   Everrett Coombe, DO  magnesium oxide (MAG-OX) 400 (240 Mg) MG tablet Take 400 mg by mouth daily.    [provider]  medroxyPROGESTERone (DEPO-PROVERA) 150 MG/ML injection Inject 150 mg into the muscle every 3 (three) months. 10/08/22   [provider]  metFORMIN (GLUCOPHAGE-XR) 500 MG 24 hr tablet Take 1 tablet (500 mg total) by mouth daily with breakfast. 10/03/22   Everrett Coombe, DO  metoprolol tartrate (LOPRESSOR) 25 MG tablet TAKE 1 TABLET BY MOUTH TWICE A DAY 11/15/22   Everrett Coombe, DO  Multiple Vitamin (MULTIVITAMIN) capsule Take 1 capsule by mouth daily.    [provider]  pregabalin (LYRICA) 75 MG capsule 1 capsule p.o. nightly for a week then twice daily for a week then 3 times daily 02/20/23   Monica Becton, MD  rosuvastatin (CRESTOR) 10 MG tablet Take 1 tablet (10 mg total) by mouth daily. 06/12/22   Antony Madura, MD  triamcinolone cream (KENALOG) 0.1 %  Apply 1 Application topically 2 (two) times daily. 07/29/22   Everrett Coombe, DO  zolpidem (AMBIEN) 10 MG tablet Take 1/2-1 tablet nightly as needed for sleep 11/11/22   Charlton Amor, DO      Allergies    Patient has no known allergies.    Review of Systems   Review of Systems  Neurological:  Positive for dizziness.  All other systems reviewed and are negative.   Physical Exam Updated Vital Signs BP 131/83   Pulse 85   Temp 97.8 F (36.6 C)   Resp 18   Ht 5\' 3"  (1.6 m)   Wt 72.1 kg   SpO2 100%   BMI 28.17 kg/m  Physical Exam Vitals and nursing  note reviewed.  Constitutional:      Appearance: Normal appearance.  HENT:     Head: Normocephalic and atraumatic.     Nose: Nose normal.     Mouth/Throat:     Mouth: Mucous membranes are moist.  Eyes:     Extraocular Movements: Extraocular movements intact.     Conjunctiva/sclera: Conjunctivae normal.     Pupils: Pupils are equal, round, and reactive to light.     Comments: No nystagmus on exam  Cardiovascular:     Rate and Rhythm: Normal rate and regular rhythm.     Pulses: Normal pulses.     Heart sounds: Normal heart sounds.  Pulmonary:     Effort: Pulmonary effort is normal.     Breath sounds: Normal breath sounds.  Abdominal:     Palpations: Abdomen is soft.     Tenderness: There is no abdominal tenderness.  Skin:    General: Skin is warm and dry.     Findings: No rash.  Neurological:     General: No focal deficit present.     Mental Status: She is alert.     Sensory: No sensory deficit.     Gait: Gait normal.     Comments: Patient has some right sided weakness at baseline from previous stroke. Sensation intact. No changes to speech. Patient able to ambulate in room.  Psychiatric:        Mood and Affect: Mood normal.        Behavior: Behavior normal.     ED Results / Procedures / Treatments   Labs (all labs ordered are listed, but only abnormal results are displayed) Labs Reviewed  CBC - Abnormal; Notable for the following components:      Result Value   WBC 12.8 (*)    All other components within normal limits  COMPREHENSIVE METABOLIC PANEL - Abnormal; Notable for the following components:   Glucose, Bld 103 (*)    Calcium 8.3 (*)    All other components within normal limits  CBG MONITORING, ED    EKG None  Radiology MR BRAIN W CONTRAST  Result Date: 02/26/2023 CLINICAL DATA:  dizziness EXAM: MRI HEAD WITH CONTRAST TECHNIQUE: Multiplanar, multiecho pulse sequences of the brain and surrounding structures were obtained with intravenous contrast.  CONTRAST:  7mL GADAVIST GADOBUTROL 1 MMOL/ML IV SOLN COMPARISON:  Same day noncontrast enhanced brain MRI FINDINGS: See same day noncontrast enhanced brain MRI for additional findings. The area of T2/FLAIR hyperintense signal seen in the region of the left medullary olive demonstrates very mild contrast enhancement (series 5, image 49). Given prior posterior circulation infarcts, an additional differential consideration is a subacute infarct in the left medulla. No other contrast-enhancing intracranial lesions are visualized. IMPRESSION: The area of T2/FLAIR hyperintense signal  seen in the region of the left medullary olive demonstrates very mild contrast enhancement. Given prior posterior circulation infarcts, an additional differential consideration is a subacute infarct in this region. Recommend follow-up brain MRI with and without contrast in 3 months to assess for resolution. Electronically Signed   By: Lorenza Cambridge M.D.   On: 02/26/2023 14:06   MR BRAIN WO CONTRAST  Result Date: 02/26/2023 CLINICAL DATA:  Fall with dizziness and history of stroke. EXAM: MRI HEAD WITHOUT CONTRAST TECHNIQUE: Multiplanar, multiecho pulse sequences of the brain and surrounding structures were obtained without intravenous contrast. COMPARISON:  Head CT from earlier today.  Brain MRI 10/18/2021 FINDINGS: Brain: Left-sided medullary olive appears expanded and FLAIR hyperintense without restricted diffusion. No insult seen elsewhere in the brain. Small FLAIR hyperintensities in cerebral white matter which have a nonspecific pattern. Given a chronic right cerebellar infarct, premature chronic small vessel ischemia is a leading consideration. There is also chart history of migraines which can cause this appearance. No hemorrhage, hydrocephalus, or shift. Vascular: Major flow voids are preserved Skull and upper cervical spine: Normal marrow signal Sinuses/Orbits: Negative IMPRESSION: T2 hyperintensity and mild swelling at the left  medullary olive. No restricted diffusion, question subacute infarct and symptomatology. 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 Suggest postcontrast assessment and surveillance to establish stability and exclude mass. Electronically Signed   By: Tiburcio Pea M.D.   On: 02/26/2023 11:39   CT Head Wo Contrast  Result Date: 02/26/2023 CLINICAL DATA:  Vertigo.  Peripheral dizziness. EXAM: CT HEAD WITHOUT CONTRAST TECHNIQUE: Contiguous axial images were obtained from the base of the skull through the vertex without intravenous contrast. RADIATION DOSE REDUCTION: This exam was performed according to the departmental dose-optimization program which includes automated exposure control, adjustment of the mA and/or kV according to patient size and/or use of iterative reconstruction technique. COMPARISON:  Brain MRI and MRA head 10/18/2021. FINDINGS: Portions of the cerebellum and occipital calvarium are excluded from the field of view inferiorly. Within this limitation, findings are as follows. Brain: No age advanced or lobar predominant parenchymal atrophy. Known small chronic infarct within the medial right cerebellar hemisphere, better appreciated on the prior brain MRI of 10/18/2021 (acute at that time). There is no acute intracranial hemorrhage. No demarcated cortical infarct. No extra-axial fluid collection. No evidence of an intracranial mass. No midline shift. Vascular: No hyperdense vessel. Skull: No calvarial fracture or aggressive osseous lesion. Sinuses/Orbits: No mass or acute finding within the imaged orbits. At the imaged levels, there is minimal mucosal thickening within the right maxillary sinus, and there is a tiny mucous retention cyst within the left maxillary sinus. IMPRESSION: 1. Portions of the cerebellum and occipital calvarium are excluded from the field of view inferiorly. Within this  limitation, findings are as follows. 2.  No evidence of an acute intracranial abnormality. 3. Known small chronic infarct within the medial right cerebellar hemisphere. 4. Minor paranasal sinus disease at the imaged levels. Electronically Signed   By: Jackey Loge D.O.   On: 02/26/2023 09:02    Procedures Procedures: not indicated.   Medications Ordered in ED Medications  meclizine (ANTIVERT) tablet 25 mg (25 mg Oral Given 02/26/23 0906)  ondansetron (ZOFRAN-ODT) disintegrating tablet 4 mg (4 mg Oral Given 02/26/23 0907)  sodium chloride 0.9 % bolus 1,000 mL (1,000 mLs Intravenous New Bag/Given 02/26/23 0845)  meclizine (ANTIVERT) tablet 25 mg (25 mg Oral Given 02/26/23 1244)  gadobutrol (  GADAVIST) 1 MMOL/ML injection 7 mL (7 mLs Intravenous Contrast Given 02/26/23 1320)    ED Course/ Medical Decision Making/ A&P                             Medical Decision Making Amount and/or Complexity of Data Reviewed Labs: ordered. Radiology: ordered.  Risk Prescription drug management.   This patient presents to the ED for concern of dizziness, this involves an extensive number of treatment options, and is a complaint that carries with it a high risk of complications and morbidity.   Differential diagnosis includes: dehydration, electrolyte abnormality, infection, BPPV, Meniere's disease, vestibular neuroma, labyrinthitis, hypoglycemia, anemia, stroke, etc.   Co morbidities that complicate the patient evaluation  History of cerebellar infarct Diabetes mellitus Hypertension GAD Migraines Dyslipidemia Paroxsymal SVT   Additional history obtained:  Additional history obtained from patient's records.   Cardiac Monitoring / EKG:  The patient was maintained on a cardiac monitor.  I personally viewed and interpreted the cardiac monitored which showed: sinus rhythm with a RBBB with a heart rate of 82 bpm.   Lab Tests:  I ordered and personally interpreted labs.  The pertinent results  include:   CBG, CMP, and CBC are within normal limits - no hypoglycemia, AKI, electrolyte abnormalities, anemia, or acute infection.   Imaging Studies ordered:  I ordered imaging studies including CT head without contrast  I independently visualized and interpreted imaging which showed: no evidence of acute intracranial abnormality I then ordered a MRI which showed:   T2 hyperintensity and mild swelling at the left medullary olive. No restricted diffusion, question subacute infarct and symptomatology. 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 Suggest postcontrast assessment and surveillance to establish stability and exclude mass. I consulted neurology who recommended MRI with contrast for further evaluation:  The area of T2/FLAIR hyperintense signal seen in the region of the left medullary olive demonstrates very mild contrast enhancement. Given prior posterior circulation infarcts, an additional differential consideration is a subacute infarct in this region. Recommend follow-up brain MRI with and without contrast in 3 months to assess for resolution. I agree with the radiologist interpretation   Consultations Obtained:  I requested consultation with neurology after receiving the MRI result,  and discussed lab and imaging findings as well as pertinent plan - they recommend: MR with contrast to assess for subacute infarct.   Problem List / ED Course / Critical interventions / Medication management  Dizziness I ordered medications including: Meclizine, Zofran, and NS for dizziness and nausea  Reevaluation of the patient after these medicines showed that the patient improved I have reviewed the patients home medicines and have made adjustments as needed   Social Determinants of Health:  Tobacco use   Test / Admission - Considered:  Discussed results with patient and husband at  bedside. Patient is stable and safe for discharge home. Strict return precautions provided. Repeat MRI in 3 months with neurology. Prescriptions for Meclizine and Zofran sent to pharmacy.       Final Clinical Impression(s) / ED Diagnoses Final diagnoses:  Dizziness    Rx / DC Orders ED Discharge Orders          Ordered    ondansetron (ZOFRAN) 4 MG tablet  Every 6 hours        02/26/23 1424    meclizine (ANTIVERT) 25 MG tablet  3 times daily  PRN        02/26/23 1424              Maxwell Marion, PA-C 02/26/23 1429    Lorre Nick, MD 02/26/23 [id number]

## 2023-02-26 NOTE — Discharge Instructions (Addendum)
As discussed, your MRI results showed - hyperintense signal at the left side of the medulla that could indicate subacute stroke but also could be due to the dizziness and headache you have been having. Repeat MRI with and without contrast in 3 months for reevaluation.  Please call your neurologist to make a follow up appointment in the next 48-72 hours.   Meclizine prescribed for dizziness and Zofran for nausea. Take as needed when these symptoms occur. These medications were sent to the pharmacy.  Get help right away if: You vomit or have diarrhea and are unable to eat or drink anything. You have problems talking, walking, swallowing, or using your arms, hands, or legs. You feel more weak than usual. You have any bleeding. You are not thinking clearly or you have trouble forming sentences. It may take a friend or family member to notice this. You have chest pain, abdominal pain, shortness of breath, or sweating. Your vision changes or you develop a severe headache.

## 2023-02-26 NOTE — ED Triage Notes (Signed)
Pt reports intermittent dizziness for the last three days. Pt reports dizziness was worse this morning but has since gotten better. Pt had stroke last year and has slurred speech and right side deficits from last stroke. Pt denies any new deficits or new weakness.

## 2023-02-27 ENCOUNTER — Telehealth: Payer: Self-pay | Admitting: Neurology

## 2023-02-27 ENCOUNTER — Encounter (INDEPENDENT_AMBULATORY_CARE_PROVIDER_SITE_OTHER): Payer: Managed Care, Other (non HMO) | Admitting: Sports Medicine

## 2023-02-27 DIAGNOSIS — R42 Dizziness and giddiness: Secondary | ICD-10-CM

## 2023-02-27 DIAGNOSIS — M5030 Other cervical disc degeneration, unspecified cervical region: Secondary | ICD-10-CM

## 2023-02-27 NOTE — Telephone Encounter (Signed)
Patient advised sept appt should be okay. She thanked me for calling.

## 2023-02-27 NOTE — Telephone Encounter (Signed)
Was told to follow up after CVA from [facility], next appt is sept, please advise. Having lots of vertigo.

## 2023-02-27 NOTE — Telephone Encounter (Signed)
I spent 5 total minutes of online digital evaluation and management services in this patient-initiated request for online care. 

## 2023-02-27 NOTE — Telephone Encounter (Signed)
Patient was seen in the hospital for a possible stroke. Patient is feeling the after effects. Was told to schedule a follow up by emergency room Doctor/KB

## 2023-03-01 ENCOUNTER — Encounter: Payer: Self-pay | Admitting: Family Medicine

## 2023-03-04 ENCOUNTER — Other Ambulatory Visit: Payer: Self-pay | Admitting: Family Medicine

## 2023-03-04 DIAGNOSIS — F5101 Primary insomnia: Secondary | ICD-10-CM

## 2023-03-09 ENCOUNTER — Ambulatory Visit: Payer: Managed Care, Other (non HMO)

## 2023-03-09 DIAGNOSIS — M5030 Other cervical disc degeneration, unspecified cervical region: Secondary | ICD-10-CM | POA: Diagnosis not present

## 2023-03-10 ENCOUNTER — Encounter: Payer: Self-pay | Admitting: Neurology

## 2023-03-17 ENCOUNTER — Encounter: Payer: Self-pay | Admitting: Sports Medicine

## 2023-03-17 NOTE — Telephone Encounter (Signed)
FYI

## 2023-03-21 ENCOUNTER — Encounter: Payer: Self-pay | Admitting: Rheumatology

## 2023-03-24 ENCOUNTER — Other Ambulatory Visit: Payer: Self-pay | Admitting: Rheumatology

## 2023-03-24 DIAGNOSIS — M25472 Effusion, left ankle: Secondary | ICD-10-CM

## 2023-03-26 ENCOUNTER — Encounter: Payer: Self-pay | Admitting: Family Medicine

## 2023-03-26 ENCOUNTER — Ambulatory Visit (INDEPENDENT_AMBULATORY_CARE_PROVIDER_SITE_OTHER): Payer: Managed Care, Other (non HMO) | Admitting: Family Medicine

## 2023-03-26 VITALS — BP 117/81 | HR 98 | Ht 63.0 in | Wt 162.0 lb

## 2023-03-26 DIAGNOSIS — R700 Elevated erythrocyte sedimentation rate: Secondary | ICD-10-CM | POA: Insufficient documentation

## 2023-03-26 DIAGNOSIS — R002 Palpitations: Secondary | ICD-10-CM

## 2023-03-26 DIAGNOSIS — G894 Chronic pain syndrome: Secondary | ICD-10-CM | POA: Insufficient documentation

## 2023-03-26 DIAGNOSIS — I639 Cerebral infarction, unspecified: Secondary | ICD-10-CM

## 2023-03-26 DIAGNOSIS — L600 Ingrowing nail: Secondary | ICD-10-CM | POA: Diagnosis not present

## 2023-03-26 DIAGNOSIS — M25472 Effusion, left ankle: Secondary | ICD-10-CM | POA: Insufficient documentation

## 2023-03-26 DIAGNOSIS — E088 Diabetes mellitus due to underlying condition with unspecified complications: Secondary | ICD-10-CM

## 2023-03-26 DIAGNOSIS — M5137 Other intervertebral disc degeneration, lumbosacral region: Secondary | ICD-10-CM | POA: Insufficient documentation

## 2023-03-26 DIAGNOSIS — I7300 Raynaud's syndrome without gangrene: Secondary | ICD-10-CM | POA: Insufficient documentation

## 2023-03-26 DIAGNOSIS — I471 Supraventricular tachycardia, unspecified: Secondary | ICD-10-CM

## 2023-03-26 DIAGNOSIS — E1169 Type 2 diabetes mellitus with other specified complication: Secondary | ICD-10-CM

## 2023-03-26 DIAGNOSIS — R768 Other specified abnormal immunological findings in serum: Secondary | ICD-10-CM

## 2023-03-26 MED ORDER — METOPROLOL SUCCINATE ER 50 MG PO TB24: 50.0000 mg | ORAL_TABLET | Freq: Every day | ORAL | 3 refills | Status: DC

## 2023-03-26 NOTE — Assessment & Plan Note (Signed)
Diabetes has been pretty well-controlled.  She will continue metformin at current strength.

## 2023-03-26 NOTE — Assessment & Plan Note (Signed)
Seen last month in the ED with possible stroke noted on MRI.  She will follow-up with her neurologist and plan to have repeat MRI in about 3 months

## 2023-03-26 NOTE — Assessment & Plan Note (Signed)
Switching from metoprolol to metoprolol XL for simplicity of dosing as she does not like taking this twice per day.  Referral placed for Dr. Jens Som here in [place].

## 2023-03-26 NOTE — Assessment & Plan Note (Signed)
Recently seen by rheumatology.  Lab work pending.

## 2023-03-26 NOTE — Patient Instructions (Signed)
Stop your current metoprolol.   Start Toprol XL 50mg  daily.

## 2023-03-26 NOTE — Progress Notes (Signed)
Nancy Cochran - 47 y.o. female MRN [id number]  Date of birth: 17-Mar-1976  Subjective Chief Complaint  Patient presents with   Pain   Mood    HPI Nancy Cochran is a 47 y.o. female here today for follow up.   She was recently seen by rheumatology.  She has several additional labs drawn for further evaluation of her elevated ANA.  Results are still pending.  She was unable to tolerate duloxetine as she felt that it caused her to feel tightness in her jaw.  She does remain on Elavil.  Lyrica was added previously as well however she had some dizziness after taking this.  Seen in the ED with concern of another stroke.  Hyperintense area seen in the region of the left medullary olive with mild contrast-enhancement.  A follow-up MRI is recommended in 3 months.  She has been hesitant to start Lyrica due to concern of this possibly contributing to the symptoms she had previously.  She has had continued episodes of palpitations.  She was seen by Dr. Tomie China previously but would like to see cardiologist closer, prefer [facility] location.  Previous event monitor was normal.  She has been on metoprolol 25 mg twice per day.  This has helped some but continues to have some breakthrough symptoms.  She also does not like taking this twice per day  She remains on metformin for management of her diabetes.  Last A1c improved to 6.2%.  She like to see podiatry for toe pain related to ingrown toenails.  ROS:  A comprehensive ROS was completed and negative except as noted per HPI  No Known Allergies  Past Medical History:  Diagnosis Date   Abnormal sensation in ear, right 07/29/2022   Cerebellar infarct (HCC) 09/03/2022   Colitis 10/18/2021   DDD (degenerative disc disease), cervical 2016   Dr. 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 10/18/2021    Past Surgical History:  Procedure Laterality Date   OTHER SURGICAL HISTORY     cyst removal shoulder    Social History   Socioeconomic History   Marital status: Married    Spouse name: Not on file   Number of children: 2   Years of education: Not on file   Highest education level: Not on file  Occupational History   Occupation: Lawyer  Tobacco Use   Smoking status: Former    Current packs/day: 0.00    Average packs/day: 1 pack/day for 28.0 years (28.0 ttl pk-yrs)    Types: Cigarettes    Quit date: 09/12/2020    Years since quitting: 2.5    Passive exposure: Never   Smokeless tobacco: Never  Vaping Use   Vaping status: Never Used  Substance and Sexual Activity   Alcohol use: No   Drug use: No   Sexual activity: Yes    Partners: Male    Birth control/protection: Injection  Other Topics Concern   Not on file  Social History Narrative   Are you right handed or left handed? Right handed   Are you currently employed ? No  Do you live at home alone? Live with husband and Daughter      What type of home do you live in: 1 story or 2 story? One story   Caffeine 3-4 sodas a day       Social Determinants of Health   Financial Resource Strain: Not on file  Food Insecurity: Not on file  Transportation Needs: Not on file  Physical Activity: Not on file  Stress: Not on file  Social Connections: Unknown (10/15/2022)   Received from [facility]   Social Network    Social Network: Not on file    Family History  Problem Relation Age of Onset   Heart disease Father    Hypertension Maternal Grandmother    Heart attack Maternal Grandmother    Hypertension Maternal Grandfather    Heart attack Maternal Grandfather     Hypertension Paternal Grandmother    Heart attack Paternal Grandmother    Stroke Paternal Grandmother    Hypertension Paternal Grandfather    Heart attack Paternal Grandfather    Stroke Maternal Aunt    Diabetes Maternal Uncle    Breast cancer Neg Hx        Mothers family   Cancer Neg Hx        Fathers family    Health Maintenance  Topic Date Due   OPHTHALMOLOGY EXAM  Never done   Diabetic kidney evaluation - Urine ACR  Never done   DTaP/Tdap/Td (1 - Tdap) Never done   PAP SMEAR-Modifier  07/29/2022   INFLUENZA VACCINE  11/10/2023 (Originally 03/13/2023)   Hepatitis C Screening  03/25/2024 (Originally 06/26/1996)   HEMOGLOBIN A1C  07/25/2023   MAMMOGRAM  10/26/2023   Diabetic kidney evaluation - eGFR measurement  02/26/2024   FOOT EXAM  03/25/2024   HIV Screening  Completed   HPV VACCINES  Aged Out   COVID-19 Vaccine  Discontinued     ----------------------------------------------------------------------------------------------------------------------------------------------------------------------------------------------------------------- Physical Exam BP 117/81 (BP Location: Right Arm, Patient Position: Sitting, Cuff Size: Normal)   Pulse 98   Ht 5\' 3"  (1.6 m)   Wt 162 lb (73.5 kg)   SpO2 100%   BMI 28.70 kg/m   Physical Exam Constitutional:      Appearance: Normal appearance.  Eyes:     General: No scleral icterus. Cardiovascular:     Rate and Rhythm: Normal rate and regular rhythm.  Pulmonary:     Effort: Pulmonary effort is normal.     Breath sounds: Normal breath sounds.  Neurological:     General: No focal deficit present.     Mental Status: She is alert.  Psychiatric:        Mood and Affect: Mood normal.     ------------------------------------------------------------------------------------------------------------------------------------------------------------------------------------------------------------------- Assessment and Plan  Paroxysmal  SVT (supraventricular tachycardia) Switching from metoprolol to metoprolol XL for simplicity of dosing as she does not like taking this twice per day.  Referral placed for Dr. Jens Som here in [place].  Diabetes mellitus due to underlying condition with unspecified complications (HCC) Diabetes has been pretty well-controlled.  She will continue metformin at current strength.  Cerebellar infarct [facility]) Seen last month in the ED with possible stroke noted on MRI.  She will follow-up with her neurologist and plan to have repeat MRI in about 3 months  ANA positive Recently seen by rheumatology.  Lab work pending.  Chronic pain syndrome Continue Elavil at current strength.  We did try duloxetine however she did not tolerate this well.  Lyrica was added by Dr. Benjamin Stain  recently however she had some dizziness that may actually be related to another stroke.  She has been hesitant to add the back on.   Meds ordered this encounter  Medications   metoprolol succinate (TOPROL-XL) 50 MG 24 hr tablet    Sig: Take 1 tablet (50 mg total) by mouth daily. Take with or immediately following a meal.    Dispense:  90 tablet    Refill:  3    Return in about 3 months (around 06/26/2023) for F/u  T2DM.    This visit occurred during the SARS-CoV-2 public health emergency.  Safety protocols were in place, including screening questions prior to the visit, additional usage of staff PPE, and extensive cleaning of exam room while observing appropriate contact time as indicated for disinfecting solutions.

## 2023-03-26 NOTE — Assessment & Plan Note (Signed)
Continue Elavil at current strength.  We did try duloxetine however she did not tolerate this well.  Lyrica was added by Dr. Benjamin Stain recently however she had some dizziness that may actually be related to another stroke.  She has been hesitant to add the back on.

## 2023-03-27 ENCOUNTER — Telehealth: Payer: Self-pay | Admitting: Cardiology

## 2023-03-27 ENCOUNTER — Encounter: Payer: Self-pay | Admitting: Family Medicine

## 2023-03-27 NOTE — Telephone Encounter (Signed)
Patient is requesting to switch from Dr. Tomie China to Dr. Jens Som, please advise.

## 2023-03-30 ENCOUNTER — Other Ambulatory Visit: Payer: Self-pay | Admitting: Family Medicine

## 2023-04-04 ENCOUNTER — Encounter: Payer: Self-pay | Admitting: Sports Medicine

## 2023-04-10 ENCOUNTER — Ambulatory Visit: Payer: Managed Care, Other (non HMO) | Admitting: Podiatry

## 2023-04-10 NOTE — Progress Notes (Signed)
HPI: Follow-up palpitations and SVT.  Previously followed by Dr. Tomie China but transitioning to me.  Patient has had previous cerebellar infarct.  CTA of the head and neck March 2023 normal.  Echocardiogram March 2023 showed normal LV function, mild mitral regurgitation.  Monitor April 2023 showed sinus rhythm with short episodes of SVT longest being 6 beats.  There was note of PACs and rare PVCs.  Repeat monitor April 2024 normal by report.  Since last seen she denies chest pain or syncope.  Occasional sensation of dyspnea.  She continues to have intermittent palpitations described as brief fluttering and racing.  These are not sustained.  Notes she did record rhythm strips with her Kardia mobile device and I reviewed these in the office and this showed sinus rhythm.  Current Outpatient Medications  Medication Sig Dispense Refill   acetaminophen (TYLENOL) 325 MG tablet Take 2 tablets (650 mg total) by mouth every 6 (six) hours as needed for mild pain, fever or headache (or Fever >/= 101). 12 tablet 0   amitriptyline (ELAVIL) 25 MG tablet TAKE 1 TABLET BY MOUTH EVERYDAY AT BEDTIME 90 tablet 3   aspirin EC 81 MG EC tablet Take 1 tablet (81 mg total) by mouth daily with breakfast. Swallow whole. 30 tablet 11   magnesium oxide (MAG-OX) 400 (240 Mg) MG tablet Take 400 mg by mouth daily.     metFORMIN (GLUCOPHAGE-XR) 500 MG 24 hr tablet TAKE 1 TABLET BY MOUTH EVERY DAY WITH BREAKFAST 90 tablet 0   metoprolol succinate (TOPROL-XL) 50 MG 24 hr tablet Take 1 tablet (50 mg total) by mouth daily. Take with or immediately following a meal. 90 tablet 3   Multiple Vitamin (MULTIVITAMIN) capsule Take 1 capsule by mouth daily.     pregabalin (LYRICA) 75 MG capsule 1 capsule p.o. nightly for a week then twice daily for a week then 3 times daily 60 capsule 3   rosuvastatin (CRESTOR) 10 MG tablet Take 1 tablet (10 mg total) by mouth daily. 90 tablet 3   triamcinolone cream (KENALOG) 0.1 % Apply 1 Application  topically 2 (two) times daily. 80 g 3   zolpidem (AMBIEN) 10 MG tablet TAKE 1/2-1 TABLET NIGHTLY AS NEEDED FOR SLEEP 30 tablet 4   No current facility-administered medications for this visit.     Past Medical History:  Diagnosis Date   Abnormal sensation in ear, right 07/29/2022   Cerebellar infarct (HCC) 09/03/2022   Colitis 10/18/2021   DDD (degenerative disc disease), cervical 2016   Dr. 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    Past Surgical History:  Procedure Laterality Date   OTHER SURGICAL HISTORY     cyst removal shoulder    Social History   Socioeconomic History   Marital status: Married    Spouse name: Not on file   Number of children: 2   Years of education: Not on file   Highest education level: Not on file  Occupational History   Occupation: Substitute Teacher  Tobacco Use   Smoking status: Former    Current packs/day: 0.00    Average  packs/day: 1 pack/day for 28.0 years (28.0 ttl pk-yrs)    Types: Cigarettes    Quit date: 09/12/2020    Years since quitting: 2.6    Passive exposure: Never   Smokeless tobacco: Never  Vaping Use   Vaping status: Never Used  Substance and Sexual Activity   Alcohol use: No   Drug use: No   Sexual activity: Yes    Partners: Male    Birth control/protection: Injection  Other Topics Concern   Not on file  Social History Narrative   Are you right handed or left handed? Right handed   Are you currently employed ? No       Do you live at home alone? Live with husband and Daughter      What type of home do you live in: 1 story or 2 story? One  story   Caffeine 3-4 sodas a day       Social Determinants of Health   Financial Resource Strain: Not on file  Food Insecurity: Not on file  Transportation Needs: Not on file  Physical Activity: Not on file  Stress: Not on file  Social Connections: Unknown (10/15/2022)   Received from [facility], [facility]   Social Network    Social Network: Not on file  Intimate Partner Violence: Unknown (10/15/2022)   Received from [employer], [facility]   HITS    Physically Hurt: Not on file    Insult or Talk Down To: Not on file    Threaten Physical Harm: Not on file    Scream or Curse: Not on file    Family History  Problem Relation Age of Onset   Heart disease Father    Hypertension Maternal Grandmother    Heart attack Maternal Grandmother    Hypertension Maternal Grandfather    Heart attack Maternal Grandfather    Hypertension Paternal Grandmother    Heart attack Paternal Grandmother    Stroke Paternal Grandmother    Hypertension Paternal Grandfather    Heart attack Paternal Grandfather    Stroke Maternal Aunt    Diabetes Maternal Uncle    Breast cancer Neg Hx        Mothers family   Cancer Neg Hx        Fathers family    ROS: no fevers or chills, productive cough, hemoptysis, dysphasia, odynophagia, melena, hematochezia, dysuria, hematuria, rash, seizure activity, orthopnea, PND, pedal edema, claudication. Remaining systems are negative.  Physical Exam: Well-developed well-nourished in no acute distress.  Skin is warm and dry.  HEENT is normal.  Neck is supple.  Chest is clear to auscultation with normal expansion.  Cardiovascular exam is regular rate and rhythm.  Abdominal exam nontender or distended. No masses palpated. Extremities show no edema. neuro grossly intact  ECG-February 26, 2023-normal sinus rhythm with right bundle branch block.  Personally reviewed  A/P  1 palpitations-previous monitor showed short run of SVT but otherwise unrevealing.  She has  recorded strips with her Kardia mobile device associated with her palpitations which showed sinus rhythm or sinus tachycardia by my review.  She will continue to record any strips associated with palpitations for my review.  Continue Toprol.  I discussed increasing to 75 mg daily but she would prefer to continue 50 mg daily at this point.  2 hyperlipidemia-continue statin.  3 history of cerebellar CVA-continue aspirin and statin.  Follow-up monitor did not show atrial fibrillation.  If neurology wishes we could ask one of our electrophysiologist to place  an implantable loop monitor.  Could also consider TEE if they feel indicated.  Olga Millers, MD

## 2023-04-18 ENCOUNTER — Ambulatory Visit (INDEPENDENT_AMBULATORY_CARE_PROVIDER_SITE_OTHER): Payer: Managed Care, Other (non HMO) | Admitting: Podiatry

## 2023-04-18 ENCOUNTER — Encounter: Payer: Self-pay | Admitting: Podiatry

## 2023-04-18 DIAGNOSIS — L600 Ingrowing nail: Secondary | ICD-10-CM

## 2023-04-18 DIAGNOSIS — E1169 Type 2 diabetes mellitus with other specified complication: Secondary | ICD-10-CM | POA: Diagnosis not present

## 2023-04-18 NOTE — Patient Instructions (Signed)

## 2023-04-18 NOTE — Progress Notes (Signed)
Subjective:  Patient ID: Nancy Cochran, female    DOB: 11/25/1975,   MRN: [id number]  Chief Complaint  Patient presents with   Ingrown Toenail    Bil ingrown great  toenails.    47 y.o. female presents for concern of bilateral ingrown toenails. Relates she has been dealing with them for a while and relates she has tried to have them trimmed out but continue to cause tenderness. Relates burning and tingling in their feet. Patient is diabetic and last A1c was  Lab Results  Component Value Date   HGBA1C 6.2 01/23/2023   .   PCP:  Everrett Coombe, DO    . Denies any other pedal complaints. Denies n/v/f/c.   Past Medical History:  Diagnosis Date   Abnormal sensation in ear, right 07/29/2022   Cerebellar infarct (HCC) 09/03/2022   Colitis 10/18/2021   DDD (degenerative disc disease), cervical 2016   Dr. 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 CFT <3 seconds. Absent hair growth on digits. Edema noted to bilateral lower extremities. Xerosis noted bilaterally.  Skin. No lacerations or abrasions bilateral feet. Nails 1-5 normal in appearence. Bilateral medial borders of great toes incurvated with tenderness to palpation. No erythema edema or purulence noted.  Musculoskeletal: MMT 5/5 bilateral lower extremities in DF, PF, Inversion and Eversion. Deceased ROM in DF of ankle joint.  Neurological: Sensation intact to light touch. Protective sensation diminished bilateral.    Assessment:   1. Ingrown left greater toenail   2. Type 2 diabetes  mellitus with other specified complication, without long-term current use of insulin (HCC)   3. Ingrown right greater toenail      Plan:  Patient was evaluated and treated and all questions answered. Discussed ingrown toenails etiology and treatment options including procedure for removal vs conservative care.  Patient requesting removal of ingrown nail today. Procedure below. Would like to start with just the left and plan for the right at a future date. Discussed procedure and post procedure care and patient expressed understanding.  Will follow-up in 2 weeks for nail check or sooner if any problems arise.    Procedure:  Procedure: partial Nail Avulsion of left hallux ,medial nail border.  Surgeon: Louann Sjogren, DPM  Pre-op Dx: Ingrown toenail without infection Post-op: Same  Place of Surgery: Office exam room.  Indications for surgery: Painful and ingrown toenail.    The patient is requesting removal of nail with chemical matrixectomy. Risks and complications were discussed with the patient for which they understand and written consent was obtained. Under sterile conditions a total of 3 mL of  1% lidocaine plain was infiltrated in a hallux block fashion. Once anesthetized, the skin was prepped in sterile fashion. A tourniquet was then applied. Next the medial aspect of hallux nail border was then sharply excised making sure to remove the entire offending nail border.  Next phenol was then applied under standard conditions and copiously irrigated. Silvadene was applied. A dry sterile dressing was applied. After application of the dressing the tourniquet was removed  and there is found to be an immediate capillary refill time to the digit. The patient tolerated the procedure well without any complications. Post procedure instructions were discussed the patient for which he verbally understood. Follow-up in two weeks for nail check or sooner if any problems are to arise. Discussed  signs/symptoms of infection and directed to call the office immediately should any occur or go directly to the emergency room. In the meantime, encouraged to call the office with any questions, concerns, changes symptoms.   Louann Sjogren, DPM

## 2023-04-21 ENCOUNTER — Ambulatory Visit (INDEPENDENT_AMBULATORY_CARE_PROVIDER_SITE_OTHER): Payer: Managed Care, Other (non HMO) | Admitting: Cardiology

## 2023-04-21 ENCOUNTER — Encounter: Payer: Self-pay | Admitting: Cardiology

## 2023-04-21 ENCOUNTER — Encounter: Payer: Self-pay | Admitting: Family Medicine

## 2023-04-21 VITALS — BP 122/80 | HR 64 | Ht 63.0 in | Wt 166.0 lb

## 2023-04-21 DIAGNOSIS — I471 Supraventricular tachycardia, unspecified: Secondary | ICD-10-CM

## 2023-04-21 DIAGNOSIS — Z8673 Personal history of transient ischemic attack (TIA), and cerebral infarction without residual deficits: Secondary | ICD-10-CM | POA: Diagnosis not present

## 2023-04-21 DIAGNOSIS — R002 Palpitations: Secondary | ICD-10-CM | POA: Diagnosis not present

## 2023-04-21 MED ORDER — NURTEC 75 MG PO TBDP: ORAL_TABLET | ORAL | 3 refills | Status: DC

## 2023-04-21 NOTE — Patient Instructions (Signed)

## 2023-04-24 NOTE — Progress Notes (Deleted)
NEUROLOGY FOLLOW UP OFFICE NOTE  KANESIA VANTREASE [id number]  Subjective:  JANYRA MASTRANGELO is a 47 y.o. 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  To briefly review: Initial consultation (06/12/22): Patient presented to the ED on 10/18/21 for vertigo, nausea, and vomiting. Patient was found to have colitis by CT abdomen and pelvis, but work up for stroke was also started. 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 She had dysarthria and right sided weakness (arm and leg) as well. Patient is not sure if this was present initially, but it was definitely noted when she returned home.    She had hypercoagulable work-up including factor V Leiden, prothrombin gene mutation, cardiolipin antibodies, beta-2 glycoprotein antibodies, lupus anticoagulant panel which were all unremarkable.  SPEP unremarkable.  No evidence of elevated ANCA titers or complement levels.  Protein S total, activity, protein C total, activity is also unremarkable.  No evidence of hypothyroidism. Cholesterol was elevated, so statin was started. She was also started on asa 81 mg. HbA1c was 5.2, TSH 0.845. Telemetry showed no arrhythmias. Echo showed no significant abnormalities. Zio patch was ordered for outpatient. Patient improved and was discharged with outpatient PT. She has not done speech therapy (was referred but could not find someone that would take her).   Patient was initially seen at [facility] in 12/2021 for post stroke care. Per their documentation, patient had aphasia and right sided weakness. Their documentation implicates depo birth control shot and smoking as likely causes of her stroke. Per patient, [facility] told her to take 2 asa 81mg  daily, so she has been. She also stopped the Crestor 10 mg due to price increase  (patient does not have insurance). Patient is still taking the depo birth control shot. She quit smoking after her stroke.   Current symptoms: -Has mild dysarthria -Thumping in right ear, which she has seen ENT and PCP without cause. It has improved but still there. -Right sided weakness, improving but still weak -Occasional dizziness, especially with quick movements -Muscle spasms on right face, arm, and stomach -Feels like her heart races more often -Takes amitriptyline for migraines (having 4-5 per month); takes tylenol for abortive but does not help much  12/11/21: Patient messaged on 06/15/22 about thumping in ear worsening, wondering if Crestor may have contributed. 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 While cerebellar lesions can cause this, her symptoms predate her stroke, with thumping starting in 07/2021 and stroke in 10/2021. She was given clonazepam by ENT. She tried this for a week, but it did not help. She was recommended botox by ENT, but did not want to pursue this. I also recommended sensory tricks (touching the roof of her mouthing, opening mouth wide, or valsava) to see if this helped.   Per my telephone note on 06/24/22: "She also mentioned a friend with ear thumping and a facial nerve compression. I mentioned that I did not see evidence of a facial nerve problem on exam, but that another MRI/MRA could evaluate. Patient had these since symptom onset though (at time of stroke) without clear pathology to explain symptoms. Given that patient does not currently have insurance, I explained that this may not be the best test at this time. Patient will consider her options and let me know if she would like the MRI/MRA.  She was not interested in another medication at this time."   Patient was seen by Dr. Ricke Hey in [facility] neurosurgery on 09/26/22. Per that note: At this point, we do not see any reason for the patient's pulsatile tinnitus. I explained the findings of  the scans to the patient and she states understanding. At this point, we could offer a diagnostic cerebral angiogram if patient would like to further investigate causes for her pulsatile tinnitus. Risks and benefits of the procedure were discussed and she would like to think about it. She will let our office know if she would like to proceed. If she does not want to have the angiogram, she can folow up on an as needed basis. Patient should follow up with neurology for the treatment of her palatal myoclonus. Patient is in agreement with plan and all questions were answered. Should the patient have any questions, concerns or changes in neuro status, they should call our office.    Patient still has some symptoms, but since taking magnesium (started back in the middle of 09/2022), which I suggested for muscles cramps, this has almost completely resolved.   She does not have new complaints. She does mention that she may have weakness and incoordination in her right arm and leg.    Patient completed physical therapy. She did not do speech therapy due to cost (she didn't have insurance). She does have insurance, but is not interested in more therapy.   She has not been smoking since her stroke. She continues asa and statin as prescribed.  Most recent Assessment and Plan (12/12/22): This is DARIELYS ZEGERS, a 47 y.o. 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 It has improved with magnesium that was recommended for muscle cramps/spasms. We discussed that if symptoms return, botox by ENT may be best option (patient previously declined this treatment)    Plan: -Botox by ENT for palatal myoclonus if symptoms return and persist -Continue magnesium oxide 400 mg every other day -Continue aspirin 81 mg  daily -Continue Crestor 10 mg daily -Discussed speech therapy now that patient has insurance, but she declined  Since their last visit: Patient went to ED on 02/26/23 for dizziness. She woke with right temple pain and dizziness that morning. Tylenol helped the pain but dizziness and nausea persisted. It was worse when she moved her head. 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 There was very mild contrast enhancement on follow up MRI with contrast. A repeat MRI brain w/wo contrast was recommended in 3 months. MRI cervical spine on 03/09/23 showed only mild spinal stenosis.  ***  On Lryica?***On amitriptyline?***Nurtec?***  MEDICATIONS:  Outpatient Encounter Medications as of 05/02/2023  Medication Sig   acetaminophen (TYLENOL) 325 MG tablet Take 2 tablets (650 mg total) by mouth every 6 (six) hours as needed for mild pain, fever or headache (or Fever >/= 101).   amitriptyline (ELAVIL) 25 MG tablet TAKE 1 TABLET BY MOUTH EVERYDAY AT BEDTIME   aspirin EC 81 MG EC tablet Take 1 tablet (81 mg total) by mouth daily with breakfast. Swallow whole.   magnesium oxide (MAG-OX) 400 (240 Mg) MG tablet Take 400 mg by mouth  daily.   metFORMIN (GLUCOPHAGE-XR) 500 MG 24 hr tablet TAKE 1 TABLET BY MOUTH EVERY DAY WITH BREAKFAST   metoprolol succinate (TOPROL-XL) 50 MG 24 hr tablet Take 1 tablet (50 mg total) by mouth daily. Take with or immediately following a meal.   Multiple Vitamin (MULTIVITAMIN) capsule Take 1 capsule by mouth daily.   pregabalin (LYRICA) 75 MG capsule 1 capsule p.o. nightly for a week then twice daily for a week then 3 times daily   Rimegepant Sulfate (NURTEC) 75 MG TBDP Take 1 tab daily as needed for migraines.   rosuvastatin (CRESTOR) 10 MG tablet Take 1 tablet (10 mg total) by mouth daily.    triamcinolone cream (KENALOG) 0.1 % Apply 1 Application topically 2 (two) times daily.   zolpidem (AMBIEN) 10 MG tablet TAKE 1/2-1 TABLET NIGHTLY AS NEEDED FOR SLEEP   No facility-administered encounter medications on file as of 05/02/2023.    PAST MEDICAL HISTORY: Past Medical History:  Diagnosis Date   Abnormal sensation in ear, right 07/29/2022   Cerebellar infarct (HCC) 09/03/2022   Colitis 10/18/2021   DDD (degenerative disc disease), cervical 2016   Dr. 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    PAST SURGICAL HISTORY: Past Surgical History:  Procedure Laterality Date   OTHER SURGICAL HISTORY     cyst removal shoulder    ALLERGIES: No Known Allergies  FAMILY HISTORY: Family History  Problem Relation Age of Onset   Heart disease Father    Hypertension Maternal Grandmother    Heart attack Maternal Grandmother    Hypertension Maternal Grandfather    Heart attack Maternal Grandfather    Hypertension Paternal Grandmother    Heart attack Paternal Grandmother    Stroke Paternal Grandmother    Hypertension Paternal Grandfather    Heart attack Paternal Grandfather    Stroke Maternal Aunt    Diabetes Maternal Uncle    Breast cancer Neg Hx        Mothers family   Cancer Neg Hx        Fathers family    SOCIAL HISTORY: Social History   Tobacco Use   Smoking status: Former    Current packs/day: 0.00    Average packs/day: 1 pack/day for 28.0 years (28.0 ttl pk-yrs)    Types: Cigarettes    Quit date: 09/12/2020    Years since quitting: 2.6    Passive exposure: Never   Smokeless tobacco:  Never  Vaping Use   Vaping status: Never Used  Substance Use Topics   Alcohol use: No   Drug use: No   Social History   Social History Narrative   Are you right handed or left handed? Right handed   Are you currently employed ? No       Do you live at home alone? Live with husband and Daughter      What type of home do you live in: 1 story or 2 story? One story   Caffeine 3-4 sodas a day          Objective:  Vital Signs:  There were no vitals taken for this visit.  ***  Labs and Imaging review: New results: CBC (02/26/23): significant for WBC of 12.8 (chronic)  01/23/23: ESR 39 CRP wnl ANA positive (1:40) CK: 71 B burgdorferi ab negative HbA1c: 6.2  CT head wo contrast (02/26/23): FINDINGS: Portions of the cerebellum and occipital calvarium are excluded from the field of view inferiorly. Within this limitation, findings are as follows.   Brain:   No age advanced or lobar predominant parenchymal atrophy.   Known small chronic infarct within the medial right cerebellar hemisphere, better appreciated on the prior brain MRI of 10/18/2021 (acute at that time).   There is no acute intracranial hemorrhage.   No demarcated cortical infarct.   No extra-axial fluid collection.   No evidence of an intracranial mass.   No midline shift.   Vascular: No hyperdense vessel.   Skull: No calvarial fracture or aggressive osseous lesion.   Sinuses/Orbits: No mass or acute finding within the imaged orbits. At the imaged levels, there is minimal mucosal thickening within the right maxillary sinus, and there is a tiny mucous retention cyst within the left maxillary sinus.   IMPRESSION: 1. Portions of the cerebellum and occipital calvarium are excluded from the field of view inferiorly. Within this limitation, findings are as follows. 2.  No evidence of an acute intracranial abnormality. 3. Known small chronic infarct within the medial right  cerebellar hemisphere. 4. Minor paranasal sinus disease at the imaged levels.  MRI brain wo contrast (02/26/23): FINDINGS: Brain: Left-sided medullary olive appears expanded and FLAIR hyperintense without restricted diffusion. No insult seen elsewhere in the brain.   Small FLAIR hyperintensities in cerebral white matter which have a nonspecific pattern. Given a chronic right cerebellar infarct, premature chronic small vessel ischemia is a leading consideration. There is also chart history of migraines which can cause this appearance.   No hemorrhage, hydrocephalus, or shift.   Vascular: Major flow voids are preserved   Skull and upper cervical spine: Normal marrow signal   Sinuses/Orbits: Negative   IMPRESSION: T2 hyperintensity and mild swelling at the left medullary olive. No restricted diffusion, question subacute infarct and symptomatology. 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 Suggest postcontrast assessment and surveillance to establish stability and exclude mass.  MRI brain w contrast (02/26/23): FINDINGS: See same day noncontrast enhanced brain MRI for additional findings. The area of T2/FLAIR hyperintense signal seen in the region of the left medullary olive demonstrates very mild contrast enhancement (series 5, image 49). Given prior posterior circulation infarcts, an additional differential consideration is a subacute infarct in the left medulla. No other contrast-enhancing intracranial lesions are visualized.   IMPRESSION: The area of T2/FLAIR hyperintense signal seen in the region of the left medullary olive demonstrates very mild contrast enhancement. Given prior posterior circulation infarcts, an additional differential consideration is a subacute infarct in this region. Recommend follow-up brain MRI with and without contrast in 3 months to assess for  resolution.  MRI cervical spine wo contrast (03/09/23): FINDINGS: Alignment: Normal alignment with preservation of the normal cervical lordosis. No listhesis.   Vertebrae: Vertebral body height maintained without acute or chronic fracture. Bone marrow signal intensity within normal limits. No worrisome osseous lesions. No abnormal marrow edema.   Cord: Normal signal and morphology.   Posterior Fossa, vertebral arteries, paraspinal tissues: Few scattered probable dilated perivascular spaces noted within the visualized brain. Craniocervical junction within normal limits. Paraspinous soft tissues within normal limits. Normal flow voids seen within the vertebral  arteries bilaterally.   Disc levels:   C2-C3: Unremarkable.   C3-C4: Small central disc protrusion mildly indents the ventral thecal sac (series 6, image 8). No spinal stenosis or cord impingement. Foramina remain patent.   C4-C5: Central to left paracentral disc protrusion indents the ventral thecal sac (series 6, image 13). Mild spinal stenosis. Foramina remain patent. This changes have mildly progressed from prior.   C5-C6: Lobulated central disc protrusion indents the ventral thecal sac, contacting and mildly flattening the ventral cord (series 6, image 17). No cord signal changes. Mild spinal stenosis. Superimposed uncovertebral spurring without significant foraminal encroachment. These changes have mildly progressed from prior.   C6-C7: Small central disc protrusion indents the ventral thecal sac. No spinal stenosis. Foramina remain patent.   C7-T1:  Unremarkable.   IMPRESSION: 1. Central disc protrusions at C4-5 and C5-6 with resultant mild spinal stenosis, mildly progressed as compared to most recent MRI from 01/08/2020. 2. Additional small central disc protrusions at C3-4 and C6-7 without significant stenosis or impingement.  Previously reviewed results: 10/21/22: Copper wnl Zinc wnl    10/02/22: HbA1c: 7.2 TSH: 1.24 Lipid panel:   Component     Latest Ref Rng 10/02/2022  Cholesterol     <200 mg/dL 409   HDL Cholesterol     > OR = 50 mg/dL 39 (L)   Triglycerides     <150 mg/dL 811   LDL Cholesterol (Calc)     mg/dL (calc) 66   Total CHOL/HDL Ratio     <5.0 (calc) 3.3   Non-HDL Cholesterol (Calc)     <130 mg/dL (calc) 89     CMP: wnl       Lab Results  Component Value Date    HGBA1C 5.2 10/18/2021      Recent Labs           Lab Results  Component Value Date    VITAMINB12 367 01/25/2022      Recent Labs           Lab Results  Component Value Date    TSH 0.845 10/18/2021      Recent Labs[] Expand by Default           Lab Results  Component Value Date    ESRSEDRATE 29 (H) 10/20/2021      Normal or unremarkable: antithrombin 3, anticardiolipin abs, prothombin gene mutation, factor 5 leiden, homocysteine, beta-2 glycoprotein, lupus anticoagulant, p-ANCA, c-ANCA, SSA, SSB, complement, HIV   MRI brain w/wo contrast (09/24/22): FINDINGS:   No evidence of abnormal contrast enhancement. No space-occupying  lesion. The courses of cranial nerves VII and VIII are normal  bilaterally, without evidence of mass or abnormal enhancement. The  cochleae and semicircular canals are normal in appearance bilaterally.  The porus acusticus is normal bilaterally. No masses are seen in the  cerebellopontine angles. The courses of the internal carotid arteries  are normal. The vestibular aqueducts and the remainder of the skull  base are normal.   IMPRESSION: Normal MRI of the IACs.    CTA head (09/24/22): FINDINGS:   EXTRAVASCULAR FINDINGS: No intracranial mass, mass effect or midline  shift. No intracranial hemorrhage. Basal cisterns, cerebellum, and  brainstem appear unremarkable. Orbits, paranasal sinuses, and  calvarium appear unremarkable.   BRAIN VASCULATURE FINDINGS: No evidence of venous sinus thrombosis or  stenosis. Symmetric appearance of the  cavernous sinuses. Normal  positioning of the jugular bulb. Intracranial arterial vasculature is  patent. No aberrant course of the ICA. No evidence of arterial  aneurysm.   IMPRESSION: No significant venous abnormality. No evidence of venous  sinus thrombosis or stenosis.    Imaging: CT head wo contrast (10/18/21): FINDINGS: Brain: There is no acute intracranial hemorrhage, mass effect, or edema. Gray-white differentiation is preserved. There is no extra-axial fluid collection. Ventricles and sulci are within normal limits in size and configuration.   Vascular: No hyperdense vessel or unexpected calcification.   Skull: Calvarium is unremarkable.   Sinuses/Orbits: No acute finding.   Other: None.   IMPRESSION: No acute intracranial abnormality.   MRI brain and MRI brain wo contrast (10/18/21): FINDINGS: MRI HEAD FINDINGS   Brain: Cerebral volume within normal limits. Few scattered subcentimeter foci of T2/FLAIR hyperintensity noted involving the supratentorial cerebral white matter, nonspecific, but overall minimal in nature, and of doubtful significance.   Small focus of restricted diffusion seen involving the superior right cerebellum and cerebellar vermis, consistent with an acute ischemic infarct (series 5, images 11-7). No associated hemorrhage or mass effect. No other evidence for acute or subacute ischemia. Gray-white matter differentiation otherwise maintained. No other areas of remote cortical infarction. No acute or chronic intracranial blood products.   No mass lesion or midline shift. No hydrocephalus or extra-axial fluid collection. Pituitary gland suprasellar region within normal limits. Midline structures intact.   Vascular: Major intracranial vascular flow voids are maintained.   Skull and upper cervical spine: Craniocervical junction within normal limits. Bone marrow signal intensity normal. No scalp soft tissue abnormality.   Sinuses/Orbits: Globes  orbital soft tissues within normal limits. Scattered mucosal thickening noted about the ethmoidal air cells and maxillary sinuses. Paranasal sinuses are otherwise clear. No mastoid effusion.   Other: None.   MRA HEAD FINDINGS   Anterior circulation: Examination moderately degraded by motion artifact.   Visualized distal cervical segments of the internal carotid arteries are patent with antegrade flow. Petrous, cavernous, and supraclinoid segments patent without hemodynamically significant stenosis or other abnormality. A1 segments patent bilaterally. Normal anterior communicating artery complex. Both ACAs patent to their distal aspects without stenosis. M1 segments widely patent bilaterally. Left M1 is fenestrated distally. Normal MCA bifurcations. Distal MCA branches perfused and symmetric.   Posterior circulation: Visualized V4 segments widely patent. Right PICA patent at its origin. Left PICA not well seen. Basilar patent to its distal aspect without stenosis. Superior cerebral arteries patent bilaterally. Both PCAs primarily supplied via the basilar well perfused to their distal aspects.   Anatomic variants: None significant.  No intracranial aneurysm.   IMPRESSION: MRI HEAD IMPRESSION:   1. Small acute ischemic nonhemorrhagic infarct involving the superior right cerebellum. 2. Otherwise normal brain MRI for age.   MRA HEAD IMPRESSION:   1. Motion degraded exam. 2. Negative intracranial MRA. No large vessel occlusion, hemodynamically significant stenosis, or other acute vascular abnormality.   CTA neck (10/19/21): FINDINGS: Aortic arch: The aortic arch is unremarkable. There is a common origin of the brachiocephalic and left common carotid arteries, a normal variant. The subclavian arteries are patent to the level imaged.   Right carotid system: Right common, internal, and external carotid arteries are patent, without hemodynamically significant stenosis  or occlusion. There is no dissection or aneurysm.   Left carotid system: The left common, internal, and external carotid arteries are patent, without hemodynamically significant stenosis or occlusion. There is no dissection or aneurysm.   Vertebral arteries: The vertebral arteries are widely patent, without hemodynamically significant stenosis or occlusion. There is no evidence of dissection or aneurysm.   Skeleton: There is no acute  osseous abnormality or aggressive osseous lesion. There is no significant degenerative change in the cervical spine. There is no visible canal hematoma.   Other neck: The soft tissues are unremarkable.   Upper chest: The imaged lung apices are clear.   IMPRESSION: Normal CTA neck. No hemodynamically significant stenosis, occlusion, dissection, or aneurysm.   Echo (10/19/21): IMPRESSIONS:  1. Left ventricular ejection fraction, by estimation, is 55 to 60%. The  left ventricle has normal function. The left ventricle has no regional  wall motion abnormalities. Left ventricular diastolic parameters were  normal.   2. Right ventricular systolic function is normal. The right ventricular  size is normal. Tricuspid regurgitation signal is inadequate for assessing  PA pressure.   3. The mitral valve is grossly normal. Mild mitral valve regurgitation.   4. The aortic valve is tricuspid. Aortic valve regurgitation is not  visualized. No aortic stenosis is present. Aortic valve mean gradient  measures 3.0 mmHg.   5. The inferior vena cava is normal in size with greater than 50%  respiratory variability, suggesting right atrial pressure of 3 mmHg.   Assessment/Plan:  This is Duane Boston, a 47 y.o. female with: ***   Plan: ***  Return to clinic in ***  Total time spent reviewing records, interview, history/exam, documentation, and coordination of care on day of encounter:  *** min  Jacquelyne Balint, MD

## 2023-04-28 ENCOUNTER — Telehealth: Payer: Self-pay | Admitting: Neurology

## 2023-04-28 NOTE — Telephone Encounter (Signed)
Pt has not heard anything from the MRI she needs to know where she can call to get schedule so that she see Dr Loleta Chance again

## 2023-04-28 NOTE — Telephone Encounter (Signed)
Called patient and informed her that I have placed her MRI brain w/ wo contrast and once the PA has been completed they will contact her for an appointment. Patient was also informed that Dr. Loleta Chance would like a follow up scheduled after her MRI. Patient verbalized understanding and had no further questions or concerns.

## 2023-04-30 ENCOUNTER — Encounter: Payer: Self-pay | Admitting: Rheumatology

## 2023-05-02 ENCOUNTER — Ambulatory Visit: Payer: Managed Care, Other (non HMO) | Admitting: Neurology

## 2023-05-02 ENCOUNTER — Encounter: Payer: Self-pay | Admitting: Sports Medicine

## 2023-05-02 DIAGNOSIS — I639 Cerebral infarction, unspecified: Secondary | ICD-10-CM

## 2023-05-02 DIAGNOSIS — R42 Dizziness and giddiness: Secondary | ICD-10-CM

## 2023-05-04 ENCOUNTER — Other Ambulatory Visit: Payer: Managed Care, Other (non HMO)

## 2023-05-08 ENCOUNTER — Ambulatory Visit: Payer: Managed Care, Other (non HMO) | Admitting: Podiatry

## 2023-05-08 ENCOUNTER — Other Ambulatory Visit: Payer: Self-pay | Admitting: Neurology

## 2023-05-08 DIAGNOSIS — E785 Hyperlipidemia, unspecified: Secondary | ICD-10-CM

## 2023-05-13 ENCOUNTER — Other Ambulatory Visit: Payer: Managed Care, Other (non HMO)

## 2023-05-16 ENCOUNTER — Telehealth: Payer: Self-pay | Admitting: Family Medicine

## 2023-05-16 NOTE — Telephone Encounter (Signed)
Patient called in stating that her insurance needed for information for her Nurtec. Please advise, may need a PA. Pt did not state what was actually needed.

## 2023-05-19 ENCOUNTER — Encounter: Payer: Self-pay | Admitting: Neurology

## 2023-05-26 ENCOUNTER — Ambulatory Visit
Admission: RE | Admit: 2023-05-26 | Discharge: 2023-05-26 | Disposition: A | Discharge status: Home / Self Care | Payer: Managed Care, Other (non HMO) | Source: Ambulatory Visit | Attending: Neurology | Admitting: Neurology

## 2023-05-26 DIAGNOSIS — I639 Cerebral infarction, unspecified: Secondary | ICD-10-CM

## 2023-05-26 DIAGNOSIS — R42 Dizziness and giddiness: Secondary | ICD-10-CM

## 2023-05-26 MED ORDER — GADOPICLENOL 0.5 MMOL/ML IV SOLN
7.0000 mL | Freq: Once | INTRAVENOUS | Status: AC | PRN
  Administered 2023-05-26: 7 mL via INTRAVENOUS

## 2023-06-05 NOTE — Progress Notes (Signed)
NEUROLOGY FOLLOW UP OFFICE NOTE  LYANNA BLYSTONE [id number]  Subjective:  PATRISIA FAETH is a 47 y.o. 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  To briefly review: Initial consultation (06/12/22): Patient presented to the ED on 10/18/21 for vertigo, nausea, and vomiting. Patient was found to have colitis by CT abdomen and pelvis, but work up for stroke was also started. 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 She had dysarthria and right sided weakness (arm and leg) as well. Patient is not sure if this was present initially, but it was definitely noted when she returned home.    She had hypercoagulable work-up including factor V Leiden, prothrombin gene mutation, cardiolipin antibodies, beta-2 glycoprotein antibodies, lupus anticoagulant panel which were all unremarkable.  SPEP unremarkable.  No evidence of elevated ANCA titers or complement levels.  Protein S total, activity, protein C total, activity is also unremarkable.  No evidence of hypothyroidism. Cholesterol was elevated, so statin was started. She was also started on asa 81 mg. HbA1c was 5.2, TSH 0.845. Telemetry showed no arrhythmias. Echo showed no significant abnormalities. Zio patch was ordered for outpatient. Patient improved and was discharged with outpatient PT. She has not done speech therapy (was referred but could not find someone that would take her).   Patient was initially seen at [facility] in 12/2021 for post stroke care. Per their documentation, patient had aphasia and right sided weakness. Their documentation implicates depo birth control shot and smoking as likely causes of her stroke. Per patient, [facility] told her to take 2 asa 81mg  daily, so she has been. She also stopped the Crestor 10 mg due to price increase  (patient does not have insurance). Patient is still taking the depo birth control shot. She quit smoking after her stroke.   Current symptoms: -Has mild dysarthria -Thumping in right ear, which she has seen ENT and PCP without cause. It has improved but still there. -Right sided weakness, improving but still weak -Occasional dizziness, especially with quick movements -Muscle spasms on right face, arm, and stomach -Feels like her heart races more often -Takes amitriptyline for migraines (having 4-5 per month); takes tylenol for abortive but does not help much   12/11/21: Patient messaged on 06/15/22 about thumping in ear worsening, wondering if Crestor may have contributed. 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 While cerebellar lesions can cause this, her symptoms predate her stroke, with thumping starting in 07/2021 and stroke in 10/2021. She was given clonazepam by ENT. She tried this for a week, but it did not help. She was recommended botox by ENT, but did not want to pursue this. I also recommended sensory tricks (touching the roof of her mouthing, opening mouth wide, or valsava) to see if this helped.   Per my telephone note on 06/24/22: "She also mentioned a friend with ear thumping and a facial nerve compression. I mentioned that I did not see evidence of a facial nerve problem on exam, but that another MRI/MRA could evaluate. Patient had these since symptom onset though (at time of stroke) without clear pathology to explain symptoms. Given that patient does not currently have insurance, I explained that this may not be the best test at this time. Patient will consider her options and let me know if she would like the  MRI/MRA. She was not interested in another medication at this time."   Patient was seen by Dr. Ricke Hey in [facility] on 09/26/22. Per that note: At this point, we do not see any reason for the patient's pulsatile tinnitus. I explained the findings of  the scans to the patient and she states understanding. At this point, we could offer a diagnostic cerebral angiogram if patient would like to further investigate causes for her pulsatile tinnitus. Risks and benefits of the procedure were discussed and she would like to think about it. She will let our office know if she would like to proceed. If she does not want to have the angiogram, she can folow up on an as needed basis. Patient should follow up with neurology for the treatment of her palatal myoclonus. Patient is in agreement with plan and all questions were answered. Should the patient have any questions, concerns or changes in neuro status, they should call our office.    Patient still has some symptoms, but since taking magnesium (started back in the middle of 09/2022), which I suggested for muscles cramps, this has almost completely resolved.   She does not have new complaints. She does mention that she may have weakness and incoordination in her right arm and leg.    Patient completed physical therapy. She did not do speech therapy due to cost (she didn't have insurance). She does have insurance, but is not interested in more therapy.   She has not been smoking since her stroke. She continues asa and statin as prescribed.  Most recent Assessment and Plan (12/12/22): This is HOLY BATTENFIELD, a 47 y.o. 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 It has improved with magnesium that was recommended for muscle cramps/spasms. We discussed that if symptoms return, botox by ENT may be best option (patient previously declined this treatment)    Plan: -Botox by ENT for palatal myoclonus if symptoms return and persist -Continue magnesium oxide 400 mg every other day -Continue aspirin 81 mg  daily -Continue Crestor 10 mg daily -Discussed speech therapy now that patient has insurance, but she declined  Since their last visit: Patient went to ED on 02/26/23 for dizziness. She woke with right temple pain and dizziness that morning. Tylenol helped the pain but dizziness and nausea persisted. It was worse when she moved her head. 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 There was very mild contrast enhancement on follow up MRI with contrast. A repeat MRI brain w/wo contrast was recommended in 3 months. Repeat MRI brain on 05/26/23 showed no acute process and unchanged T2 hyperintensity in left ventral medulla with interval resolution of enhancement. MRI cervical spine on 03/09/23 showed only mild spinal stenosis.  Patient's vertigo has improved significantly. She still has weakness on her right side. This has not improved much. She still speaks slower with occasional word finding difficulties as well.  Her palatal myoclonus is still there but minimal and not bothering her currently.  She is still taking asa 81 mg and Crestor 10 mg daily.   She notes low back pain for which she is doing PT.   Of note, patient has a  long history of migraines for which she takes amitriptyline for prevention and Nurtec for rescue.  MEDICATIONS:  Outpatient Encounter Medications as of 06/18/2023  Medication Sig   acetaminophen (TYLENOL) 325 MG tablet Take 2 tablets (650 mg total) by mouth every 6 (six) hours as needed for mild pain, fever or headache (or Fever >/= 101).   amitriptyline (ELAVIL) 50 MG tablet TAKE 1 TABLET BY MOUTH EVERYDAY AT BEDTIME   aspirin EC 81 MG EC tablet Take 1 tablet (81 mg total) by mouth daily with breakfast. Swallow whole.   magnesium oxide (MAG-OX) 400 (240 Mg) MG tablet Take 400 mg by mouth  daily.   metFORMIN (GLUCOPHAGE-XR) 500 MG 24 hr tablet TAKE 1 TABLET BY MOUTH EVERY DAY WITH BREAKFAST   metoprolol succinate (TOPROL-XL) 50 MG 24 hr tablet Take 1 tablet (50 mg total) by mouth daily. Take with or immediately following a meal.   Multiple Vitamin (MULTIVITAMIN) capsule Take 1 capsule by mouth daily.   naproxen (NAPROSYN) 500 MG tablet Take 1 tablet (500 mg total) by mouth 2 (two) times daily with a meal.   Rimegepant Sulfate (NURTEC) 75 MG TBDP Take 1 tab daily as needed for migraines.   rosuvastatin (CRESTOR) 10 MG tablet TAKE 1 TABLET BY MOUTH EVERY DAY   triamcinolone cream (KENALOG) 0.1 % Apply 1 Application topically 2 (two) times daily.   zolpidem (AMBIEN) 10 MG tablet TAKE 1/2-1 TABLET NIGHTLY AS NEEDED FOR SLEEP   [DISCONTINUED] amitriptyline (ELAVIL) 25 MG tablet TAKE 1 TABLET BY MOUTH EVERYDAY AT BEDTIME   [DISCONTINUED] pregabalin (LYRICA) 75 MG capsule 1 capsule p.o. nightly for a week then twice daily for a week then 3 times daily   No facility-administered encounter medications on file as of 06/18/2023.    PAST MEDICAL HISTORY: Past Medical History:  Diagnosis Date   Abnormal sensation in ear, right 07/29/2022   Cerebellar infarct (HCC) 09/03/2022   Colitis 10/18/2021   DDD (degenerative disc disease), cervical 2016   Dr. 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    PAST SURGICAL HISTORY: Past Surgical History:  Procedure Laterality Date   OTHER SURGICAL HISTORY     cyst removal shoulder    ALLERGIES: Allergies  Allergen Reactions   Gabapentin    Lyrica [Pregabalin]      FAMILY HISTORY: Family History  Problem Relation Age of Onset   Heart disease Father    Hypertension Maternal Grandmother    Heart attack Maternal Grandmother    Hypertension Maternal Grandfather    Heart attack Maternal Grandfather    Hypertension Paternal Grandmother    Heart attack Paternal Grandmother    Stroke Paternal Grandmother    Hypertension Paternal Grandfather    Heart attack Paternal Grandfather    Stroke Maternal Aunt    Diabetes Maternal Uncle    Breast cancer Neg Hx        Mothers family   Cancer Neg Hx        Fathers family    SOCIAL HISTORY: Social History   Tobacco Use   Smoking status: Former    Current packs/day: 0.00  Average packs/day: 1 pack/day for 28.0 years (28.0 ttl pk-yrs)    Types: Cigarettes    Quit date: 09/12/2020    Years since quitting: 2.7    Passive exposure: Never   Smokeless tobacco: Never  Vaping Use   Vaping status: Never Used  Substance Use Topics   Alcohol use: No   Drug use: No   Social History   Social History Narrative   Are you right handed or left handed? Right handed   Are you currently employed ? No       Do you live at home alone? Live with husband and Daughter      What type of home do you live in: 1 story or 2 story? One story   Caffeine 3-4 sodas a day          Objective:  Vital Signs:  BP (!) 136/91   Pulse 90   Ht 5\' 3"  (1.6 m)   Wt 170 lb (77.1 kg)   SpO2 98%   BMI 30.11 kg/m   General: No acute distress.  Patient appears well-groomed.   Head:  Normocephalic/atraumatic Neck: supple, no paraspinal tenderness, full range of motion Back: No paraspinal tenderness Heart: regular rate and rhythm Lungs: Clear to auscultation bilaterally. Vascular: No carotid bruits.  Neurological Exam: Mental status: alert and oriented, speech fluent. Mild dysarthria. language intact.  Cranial nerves: CN I: not tested CN II: pupils equal, round and reactive to light, visual fields intact CN III, IV,  VI:  full range of motion, no nystagmus, no ptosis CN V: facial sensation intact. CN VII: upper and lower face symmetric CN VIII: hearing intact CN IX, X: uvula midline. Bilateral palatal myoclonus.  CN XI: sternocleidomastoid and trapezius muscles intact CN XII: tongue midline  Bulk & Tone: normal, no fasciculations. Motor:  muscle strength 5/5 throughout Deep Tendon Reflexes:  2+ throughout.   Sensation:  Pinprick sensation appears intact (?mild reduction in RLE is equivocal and patchy). Finger to nose testing:  Without dysmetria. (Improved dysmetria on right) Gait:  Normal station and stride.   Labs and Imaging review: New results: CBC (02/26/23): significant for WBC of 12.8 (chronic)   01/23/23: ESR 39 CRP wnl ANA positive (1:40) CK: 71 B burgdorferi ab negative HbA1c: 6.2  MRI brain w/wo contrast (05/26/23): I personally reviewed images and agree with radiology read below. FINDINGS: Brain: No acute infarct or hemorrhage. 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 Stable background of mild chronic small-vessel disease. Unchanged old lacunar infarct in the right cerebellar hemisphere. No hydrocephalus or extra-axial collection. No mass or abnormal enhancement. No foci of abnormal susceptibility.   Vascular: Normal flow voids and vessel enhancement.   Skull and upper cervical spine: Normal marrow signal and enhancement.   Sinuses/Orbits: No acute findings.   Other: None.   IMPRESSION: 1. No acute intracranial abnormality or mass. 2. 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   CT head wo contrast (02/26/23): FINDINGS: Portions of the cerebellum and occipital calvarium are excluded from the field of view inferiorly. Within this limitation,  findings are as follows.   Brain:   No age advanced or lobar predominant parenchymal atrophy.   Known small chronic infarct within the medial right cerebellar hemisphere, better appreciated on the prior brain MRI of 10/18/2021 (acute at that time).   There is  no acute intracranial hemorrhage.   No demarcated cortical infarct.   No extra-axial fluid collection.   No evidence of an intracranial mass.   No midline shift.   Vascular: No hyperdense vessel.   Skull: No calvarial fracture or aggressive osseous lesion.   Sinuses/Orbits: No mass or acute finding within the imaged orbits. At the imaged levels, there is minimal mucosal thickening within the right maxillary sinus, and there is a tiny mucous retention cyst within the left maxillary sinus.   IMPRESSION: 1. Portions of the cerebellum and occipital calvarium are excluded from the field of view inferiorly. Within this limitation, findings are as follows. 2.  No evidence of an acute intracranial abnormality. 3. Known small chronic infarct within the medial right cerebellar hemisphere. 4. Minor paranasal sinus disease at the imaged levels.   MRI brain wo contrast (02/26/23): I personally reviewed images and agree with radiology read below. FINDINGS: Brain: Left-sided medullary olive appears expanded and FLAIR hyperintense without restricted diffusion. No insult seen elsewhere in the brain.   Small FLAIR hyperintensities in cerebral white matter which have a nonspecific pattern. Given a chronic right cerebellar infarct, premature chronic small vessel ischemia is a leading consideration. There is also chart history of migraines which can cause this appearance.   No hemorrhage, hydrocephalus, or shift.   Vascular: Major flow voids are preserved   Skull and upper cervical spine: Normal marrow signal   Sinuses/Orbits: Negative   IMPRESSION: T2 hyperintensity and mild swelling at the left medullary olive.  No restricted diffusion, question subacute infarct and symptomatology. 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 Suggest postcontrast assessment and surveillance to establish stability and exclude mass.   MRI brain w contrast (02/26/23): FINDINGS: See same day noncontrast enhanced brain MRI for additional findings. The area of T2/FLAIR hyperintense signal seen in the region of the left medullary olive demonstrates very mild contrast enhancement (series 5, image 49). Given prior posterior circulation infarcts, an additional differential consideration is a subacute infarct in the left medulla. No other contrast-enhancing intracranial lesions are visualized.   IMPRESSION: The area of T2/FLAIR hyperintense signal seen in the region of the left medullary olive demonstrates very mild contrast enhancement. Given prior posterior circulation infarcts, an additional differential consideration is a subacute infarct in this region. Recommend follow-up brain MRI with and without contrast in 3 months to assess for resolution.   MRI cervical spine wo contrast (03/09/23): FINDINGS: Alignment: Normal alignment with preservation of the normal cervical lordosis. No listhesis.   Vertebrae: Vertebral body height maintained without acute or chronic fracture. Bone marrow signal intensity within normal limits. No worrisome osseous lesions. No abnormal marrow edema.   Cord: Normal signal and morphology.   Posterior Fossa, vertebral arteries, paraspinal tissues: Few scattered probable dilated perivascular spaces noted within the visualized brain. Craniocervical junction within normal limits. Paraspinous soft tissues within normal limits. Normal flow voids seen within the vertebral arteries bilaterally.   Disc levels:   C2-C3: Unremarkable.   C3-C4: Small central disc protrusion mildly indents the  ventral thecal sac (series 6, image 8). No spinal stenosis or cord impingement. Foramina remain patent.   C4-C5: Central to left paracentral disc protrusion indents the ventral thecal sac (series 6, image 13). Mild spinal stenosis. Foramina remain patent. This changes have mildly progressed from prior.   C5-C6: Lobulated central disc protrusion indents the ventral thecal sac, contacting and mildly flattening the ventral cord (series 6, image  17). No cord signal changes. Mild spinal stenosis. Superimposed uncovertebral spurring without significant foraminal encroachment. These changes have mildly progressed from prior.   C6-C7: Small central disc protrusion indents the ventral thecal sac. No spinal stenosis. Foramina remain patent.   C7-T1:  Unremarkable.   IMPRESSION: 1. Central disc protrusions at C4-5 and C5-6 with resultant mild spinal stenosis, mildly progressed as compared to most recent MRI from 01/08/2020. 2. Additional small central disc protrusions at C3-4 and C6-7 without significant stenosis or impingement.   Previously reviewed results: 10/21/22: Copper wnl Zinc wnl   10/02/22: HbA1c: 7.2 TSH: 1.24 Lipid panel:   Component     Latest Ref Rng 10/02/2022  Cholesterol     <200 mg/dL 401   HDL Cholesterol     > OR = 50 mg/dL 39 (L)   Triglycerides     <150 mg/dL 027   LDL Cholesterol (Calc)     mg/dL (calc) 66   Total CHOL/HDL Ratio     <5.0 (calc) 3.3   Non-HDL Cholesterol (Calc)     <130 mg/dL (calc) 89     CMP: wnl            Lab Results  Component Value Date    HGBA1C 5.2 10/18/2021      Recent Labs           Lab Results  Component Value Date    VITAMINB12 367 01/25/2022      Recent Labs           Lab Results  Component Value Date    TSH 0.845 10/18/2021      Recent Labs[] Expand by Default           Lab Results  Component Value Date    ESRSEDRATE 29 (H) 10/20/2021      Normal or unremarkable: antithrombin 3, anticardiolipin  abs, prothombin gene mutation, factor 5 leiden, homocysteine, beta-2 glycoprotein, lupus anticoagulant, p-ANCA, c-ANCA, SSA, SSB, complement, HIV   MRI brain w/wo contrast (09/24/22): FINDINGS:   No evidence of abnormal contrast enhancement. No space-occupying  lesion. The courses of cranial nerves VII and VIII are normal  bilaterally, without evidence of mass or abnormal enhancement. The  cochleae and semicircular canals are normal in appearance bilaterally.  The porus acusticus is normal bilaterally. No masses are seen in the  cerebellopontine angles. The courses of the internal carotid arteries  are normal. The vestibular aqueducts and the remainder of the skull  base are normal.   IMPRESSION: Normal MRI of the IACs.    CTA head (09/24/22): FINDINGS:   EXTRAVASCULAR FINDINGS: No intracranial mass, mass effect or midline  shift. No intracranial hemorrhage. Basal cisterns, cerebellum, and  brainstem appear unremarkable. Orbits, paranasal sinuses, and  calvarium appear unremarkable.   BRAIN VASCULATURE FINDINGS: No evidence of venous sinus thrombosis or  stenosis. Symmetric appearance of the cavernous sinuses. Normal  positioning of the jugular bulb. Intracranial arterial vasculature is  patent. No aberrant course of the ICA. No evidence of arterial  aneurysm.   IMPRESSION: No significant venous abnormality. No evidence of venous  sinus thrombosis or stenosis.    Imaging: CT head wo contrast (10/18/21): FINDINGS: Brain: There is no acute intracranial hemorrhage, mass effect, or edema. Gray-white differentiation is preserved. There is no extra-axial fluid collection. Ventricles and sulci are within normal limits in size and configuration.   Vascular: No hyperdense vessel or unexpected calcification.   Skull: Calvarium is unremarkable.   Sinuses/Orbits: No acute finding.   Other: None.  IMPRESSION: No acute intracranial abnormality.   MRI brain and MRI brain wo contrast  (10/18/21): FINDINGS: MRI HEAD FINDINGS   Brain: Cerebral volume within normal limits. Few scattered subcentimeter foci of T2/FLAIR hyperintensity noted involving the supratentorial cerebral white matter, nonspecific, but overall minimal in nature, and of doubtful significance.   Small focus of restricted diffusion seen involving the superior right cerebellum and cerebellar vermis, consistent with an acute ischemic infarct (series 5, images 11-7). No associated hemorrhage or mass effect. No other evidence for acute or subacute ischemia. Gray-white matter differentiation otherwise maintained. No other areas of remote cortical infarction. No acute or chronic intracranial blood products.   No mass lesion or midline shift. No hydrocephalus or extra-axial fluid collection. Pituitary gland suprasellar region within normal limits. Midline structures intact.   Vascular: Major intracranial vascular flow voids are maintained.   Skull and upper cervical spine: Craniocervical junction within normal limits. Bone marrow signal intensity normal. No scalp soft tissue abnormality.   Sinuses/Orbits: Globes orbital soft tissues within normal limits. Scattered mucosal thickening noted about the ethmoidal air cells and maxillary sinuses. Paranasal sinuses are otherwise clear. No mastoid effusion.   Other: None.   MRA HEAD FINDINGS   Anterior circulation: Examination moderately degraded by motion artifact.   Visualized distal cervical segments of the internal carotid arteries are patent with antegrade flow. Petrous, cavernous, and supraclinoid segments patent without hemodynamically significant stenosis or other abnormality. A1 segments patent bilaterally. Normal anterior communicating artery complex. Both ACAs patent to their distal aspects without stenosis. M1 segments widely patent bilaterally. Left M1 is fenestrated distally. Normal MCA bifurcations. Distal MCA branches perfused and  symmetric.   Posterior circulation: Visualized V4 segments widely patent. Right PICA patent at its origin. Left PICA not well seen. Basilar patent to its distal aspect without stenosis. Superior cerebral arteries patent bilaterally. Both PCAs primarily supplied via the basilar well perfused to their distal aspects.   Anatomic variants: None significant.  No intracranial aneurysm.   IMPRESSION: MRI HEAD IMPRESSION:   1. Small acute ischemic nonhemorrhagic infarct involving the superior right cerebellum. 2. Otherwise normal brain MRI for age.   MRA HEAD IMPRESSION:   1. Motion degraded exam. 2. Negative intracranial MRA. No large vessel occlusion, hemodynamically significant stenosis, or other acute vascular abnormality.   CTA neck (10/19/21): FINDINGS: Aortic arch: The aortic arch is unremarkable. There is a common origin of the brachiocephalic and left common carotid arteries, a normal variant. The subclavian arteries are patent to the level imaged.   Right carotid system: Right common, internal, and external carotid arteries are patent, without hemodynamically significant stenosis or occlusion. There is no dissection or aneurysm.   Left carotid system: The left common, internal, and external carotid arteries are patent, without hemodynamically significant stenosis or occlusion. There is no dissection or aneurysm.   Vertebral arteries: The vertebral arteries are widely patent, without hemodynamically significant stenosis or occlusion. There is no evidence of dissection or aneurysm.   Skeleton: There is no acute osseous abnormality or aggressive osseous lesion. There is no significant degenerative change in the cervical spine. There is no visible canal hematoma.   Other neck: The soft tissues are unremarkable.   Upper chest: The imaged lung apices are clear.   IMPRESSION: Normal CTA neck. No hemodynamically significant stenosis, occlusion, dissection, or aneurysm.    Echo (10/19/21): IMPRESSIONS:  1. Left ventricular ejection fraction, by estimation, is 55 to 60%. The  left ventricle has normal function. The left ventricle has no regional  wall motion abnormalities. Left ventricular diastolic parameters were  normal.   2. Right ventricular systolic function is normal. The right ventricular  size is normal. Tricuspid regurgitation signal is inadequate for assessing  PA pressure.   3. The mitral valve is grossly normal. Mild mitral valve regurgitation.   4. The aortic valve is tricuspid. Aortic valve regurgitation is not  visualized. No aortic stenosis is present. Aortic valve mean gradient  measures 3.0 mmHg.   5. The inferior vena cava is normal in size with greater than 50%  respiratory variability, suggesting right atrial pressure of 3 mmHg.   Assessment/Plan:  This is ABIE KILLIAN, a 47 y.o. 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 It has improved with magnesium that was recommended for muscle cramps/spasms and only minimally symptomatic. We discussed that if symptoms return, botox by ENT may be best option (patient previously declined this treatment)  Migraines - currently about 2 per month. Well controlled with amitriptyline and Nurtec prescribed by PCP   Plan: For secondary stroke prevention: -Continue aspirin 81 mg daily -Continue Crestor 10 mg daily -Discussed stroke warning signs  -Continue magnesium oxide 400 mg every other day  -Her migraine treatment is currently appropriate. We discussed that because of her history of stroke, she should not ever be on Triptans for  migraine rescue (currently on Nurtec).  Return to clinic in 6 months   Jacquelyne Balint, MD

## 2023-06-10 ENCOUNTER — Encounter: Payer: Self-pay | Admitting: Sports Medicine

## 2023-06-10 ENCOUNTER — Ambulatory Visit: Payer: Managed Care, Other (non HMO)

## 2023-06-10 ENCOUNTER — Ambulatory Visit (INDEPENDENT_AMBULATORY_CARE_PROVIDER_SITE_OTHER): Payer: Managed Care, Other (non HMO) | Admitting: Sports Medicine

## 2023-06-10 DIAGNOSIS — M5416 Radiculopathy, lumbar region: Secondary | ICD-10-CM

## 2023-06-10 DIAGNOSIS — M549 Dorsalgia, unspecified: Secondary | ICD-10-CM

## 2023-06-10 DIAGNOSIS — M5030 Other cervical disc degeneration, unspecified cervical region: Secondary | ICD-10-CM | POA: Diagnosis not present

## 2023-06-10 MED ORDER — NAPROXEN 500 MG PO TABS
500.0000 mg | ORAL_TABLET | Freq: Two times a day (BID) | ORAL | 3 refills | Status: DC
Start: 2023-06-10 — End: 2023-09-26

## 2023-06-10 MED ORDER — AMITRIPTYLINE HCL 50 MG PO TABS
ORAL_TABLET | ORAL | 11 refills | Status: DC
Start: 2023-06-10 — End: 2023-09-26

## 2023-06-10 NOTE — Assessment & Plan Note (Signed)
47 year old female, known cervical DDD, right sided neck pain with periscapular radiation, radiation down the arm but not exactly sure which fingers, we did Decadron as she did not tolerate prednisone, we also ordered a cervical interlaminar epidural. Gabapentin was intolerable so we did Lyrica. Home physical therapy was given. Unfortunately she tells me that nothing has helped. We will discontinue Lyrica, bump up her amitriptyline and add additional physical therapy but formal this time at [facility]. Adding naproxen 500 twice daily. We will do this for 6 to 8 weeks before considering surgical consultation and SNRI treatment.

## 2023-06-10 NOTE — Progress Notes (Signed)
    Procedures performed today:    None.  Independent interpretation of notes and tests performed by another provider:   None.  Brief History, Exam, Impression, and Recommendations:    DDD (degenerative disc disease), cervical 46 year old female, known cervical DDD, right sided neck pain with periscapular radiation, radiation down the arm but not exactly sure which fingers, we did Decadron as she did not tolerate prednisone, we also ordered a cervical interlaminar epidural. Gabapentin was intolerable so we did Lyrica. Home physical therapy was given. Unfortunately she tells me that nothing has helped. We will discontinue Lyrica, bump up her amitriptyline and add additional physical therapy but formal this time at [facility]. Adding naproxen 500 twice daily. We will do this for 6 to 8 weeks before considering surgical consultation and SNRI treatment.  Right lumbar radiculitis Also of increasing pain right side low back with radiation down the right leg to the toes. Worse with all positions. No red flag symptoms. Started conservative, x-rays, formal physical therapy, naproxen. We will increase her amitriptyline. She did not tolerate gabapentin or Lyrica. Will do this for 6 to 8 weeks and if insufficient improvement we will consider MRI for epidural planning.    ____________________________________________ Ihor Austin. Benjamin Stain, M.D., ABFM., CAQSM., AME. Primary Care and Sports Medicine [facility]  Adjunct Professor of Family Medicine  [employer]  Restaurant manager, fast food

## 2023-06-10 NOTE — Assessment & Plan Note (Signed)
Also of increasing pain right side low back with radiation down the right leg to the toes. Worse with all positions. No red flag symptoms. Started conservative, x-rays, formal physical therapy, naproxen. We will increase her amitriptyline. She did not tolerate gabapentin or Lyrica. Will do this for 6 to 8 weeks and if insufficient improvement we will consider MRI for epidural planning.

## 2023-06-17 ENCOUNTER — Telehealth (INDEPENDENT_AMBULATORY_CARE_PROVIDER_SITE_OTHER): Payer: Managed Care, Other (non HMO) | Admitting: Family Medicine

## 2023-06-17 ENCOUNTER — Encounter: Payer: Self-pay | Admitting: Family Medicine

## 2023-06-17 VITALS — Ht 63.0 in | Wt 160.0 lb

## 2023-06-17 DIAGNOSIS — J309 Allergic rhinitis, unspecified: Secondary | ICD-10-CM | POA: Insufficient documentation

## 2023-06-17 NOTE — Progress Notes (Signed)
Nancy Cochran - 47 y.o. female MRN [id number]  Date of birth: 17-Jun-1976   This visit type was conducted due to national recommendations for restrictions regarding the COVID-19 Pandemic (e.g. social distancing).  This format is felt to be most appropriate for this patient at this time.  All issues noted in this document were discussed and addressed.  No physical exam was performed (except for noted visual exam findings with Video Visits).  I discussed the limitations of evaluation and management by telemedicine and the availability of in person appointments. The patient expressed understanding and agreed to proceed.  I connected withNAME@ on 06/17/23 at 11:30 AM EST by a video enabled telemedicine application and verified that I am speaking with the correct person using two identifiers.  Present at visit: Everrett Coombe, DO [facility] [place]   Patient Location: Home [address] [place] [address]   Provider location:   [facility]  Chief Complaint  Patient presents with   Sinusitis    HPI  Nancy Cochran is a 47 y.o. female who presents via audio/video conferencing for a telehealth visit today.  She has complained about nasal congestion and dry cough.  Symptoms started little over a week ago.  She feels like she is getting some postnasal drainage as her symptoms are worse first thing in the morning.  She is taking antihistamine and has tried Afrin.  She has tried Flonase in the past which did not really help.  She denies sinus pain, fever or chills.   ROS:  A comprehensive ROS was completed and negative except as noted per HPI  Past Medical History:  Diagnosis Date   Abnormal sensation in ear, right 07/29/2022   Cerebellar infarct (HCC) 09/03/2022   Colitis 10/18/2021   DDD (degenerative disc disease), cervical 2016   Dr. 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   Primary insomnia 11/19/2019   Skin lesion 07/29/2022   Tinnitus of both ears 09/03/2022   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 10/18/2021    Past Surgical History:  Procedure Laterality Date   OTHER SURGICAL HISTORY     cyst removal shoulder    Family History  Problem Relation Age of Onset   Heart disease Father    Hypertension Maternal Grandmother    Heart attack Maternal Grandmother    Hypertension Maternal Grandfather    Heart attack Maternal Grandfather    Hypertension Paternal Grandmother    Heart attack Paternal Grandmother    Stroke Paternal Grandmother    Hypertension Paternal Grandfather    Heart attack Paternal Grandfather    Stroke Maternal Aunt    Diabetes Maternal Uncle    Breast cancer Neg Hx        Mothers family   Cancer Neg Hx        Fathers family    Social History   Socioeconomic History   Marital status: Married    Spouse name: Not on file   Number of children: 2   Years of education: Not on file   Highest education level: Not on file  Occupational History   Occupation: Lawyer  Tobacco Use   Smoking status: Former    Current packs/day: 0.00    Average packs/day: 1 pack/day for 28.0 years (  28.0 ttl pk-yrs)    Types: Cigarettes    Quit date: 09/12/2020    Years since quitting: 2.7    Passive exposure: Never   Smokeless tobacco: Never  Vaping Use   Vaping status: Never Used  Substance and Sexual Activity   Alcohol use: No   Drug use: No   Sexual activity: Yes    Partners: Male    Birth control/protection: Injection  Other Topics Concern   Not on file  Social History Narrative   Are you right handed or left handed? Right handed   Are you currently employed ? No       Do you live at home alone? Live with husband and Daughter      What type of home do you live in: 1 story or 2 story? One story    Caffeine 3-4 sodas a day       Social Determinants of Health   Financial Resource Strain: Not on file  Food Insecurity: Not on file  Transportation Needs: Not on file  Physical Activity: Not on file  Stress: Not on file  Social Connections: Unknown (10/15/2022)   Received from [facility], [facility]   Social Network    Social Network: Not on file  Intimate Partner Violence: Unknown (10/15/2022)   Received from [facility], [facility]   HITS    Physically Hurt: Not on file    Insult or Talk Down To: Not on file    Threaten Physical Harm: Not on file    Scream or Curse: Not on file     Current Outpatient Medications:    acetaminophen (TYLENOL) 325 MG tablet, Take 2 tablets (650 mg total) by mouth every 6 (six) hours as needed for mild pain, fever or headache (or Fever >/= 101)., Disp: 12 tablet, Rfl: 0   amitriptyline (ELAVIL) 50 MG tablet, TAKE 1 TABLET BY MOUTH EVERYDAY AT BEDTIME, Disp: 30 tablet, Rfl: 11   aspirin EC 81 MG EC tablet, Take 1 tablet (81 mg total) by mouth daily with breakfast. Swallow whole., Disp: 30 tablet, Rfl: 11   magnesium oxide (MAG-OX) 400 (240 Mg) MG tablet, Take 400 mg by mouth daily., Disp: , Rfl:    metFORMIN (GLUCOPHAGE-XR) 500 MG 24 hr tablet, TAKE 1 TABLET BY MOUTH EVERY DAY WITH BREAKFAST, Disp: 90 tablet, Rfl: 0   metoprolol succinate (TOPROL-XL) 50 MG 24 hr tablet, Take 1 tablet (50 mg total) by mouth daily. Take with or immediately following a meal., Disp: 90 tablet, Rfl: 3   Multiple Vitamin (MULTIVITAMIN) capsule, Take 1 capsule by mouth daily., Disp: , Rfl:    naproxen (NAPROSYN) 500 MG tablet, Take 1 tablet (500 mg total) by mouth 2 (two) times daily with a meal., Disp: 60 tablet, Rfl: 3   Rimegepant Sulfate (NURTEC) 75 MG TBDP, Take 1 tab daily as needed for migraines., Disp: 16 tablet, Rfl: 3   rosuvastatin (CRESTOR) 10 MG tablet, TAKE 1 TABLET BY MOUTH EVERY DAY, Disp: 90 tablet, Rfl: 0   triamcinolone cream (KENALOG) 0.1 %, Apply 1  Application topically 2 (two) times daily., Disp: 80 g, Rfl: 3   zolpidem (AMBIEN) 10 MG tablet, TAKE 1/2-1 TABLET NIGHTLY AS NEEDED FOR SLEEP, Disp: 30 tablet, Rfl: 4  EXAM:  VITALS per patient if applicable: Ht 5\' 3"  (1.6 m)   Wt 160 lb (72.6 kg)   BMI 28.34 kg/m   GENERAL: alert, oriented, appears well and in no acute distress  HEENT: atraumatic, conjunttiva clear, no  obvious abnormalities on inspection of external nose and ears  NECK: normal movements of the head and neck  LUNGS: on inspection no signs of respiratory distress, breathing rate appears normal, no obvious gross SOB, gasping or wheezing  CV: no obvious cyanosis  MS: moves all visible extremities without noticeable abnormality  PSYCH/NEURO: pleasant and cooperative, no obvious depression or anxiety, speech and thought processing grossly intact  ASSESSMENT AND PLAN:  Discussed the following assessment and plan:  Allergic rhinitis Recommend continued use of antihistamine.  Recommend adding Astepro.  She will let me know if symptoms are worsening or not improving with this.     I discussed the assessment and treatment plan with the patient. The patient was provided an opportunity to ask questions and all were answered. The patient agreed with the plan and demonstrated an understanding of the instructions.   The patient was advised to call back or seek an in-person evaluation if the symptoms worsen or if the condition fails to improve as anticipated.    Everrett Coombe, DO

## 2023-06-17 NOTE — Progress Notes (Signed)
Sx: head cold. Dry cough. Nasal congestion.  Started 06/04/23 Meds:  allergy pill, Afrin, Mucinex

## 2023-06-17 NOTE — Telephone Encounter (Signed)
Patient scheduled.

## 2023-06-17 NOTE — Assessment & Plan Note (Signed)
Recommend continued use of antihistamine.  Recommend adding Astepro.  She will let me know if symptoms are worsening or not improving with this.

## 2023-06-18 ENCOUNTER — Encounter: Payer: Self-pay | Admitting: Neurology

## 2023-06-18 ENCOUNTER — Ambulatory Visit (INDEPENDENT_AMBULATORY_CARE_PROVIDER_SITE_OTHER): Payer: Managed Care, Other (non HMO) | Admitting: Neurology

## 2023-06-18 VITALS — BP 144/80 | HR 90 | Ht 63.0 in | Wt 170.0 lb

## 2023-06-18 DIAGNOSIS — G43809 Other migraine, not intractable, without status migrainosus: Secondary | ICD-10-CM

## 2023-06-18 DIAGNOSIS — I6789 Other cerebrovascular disease: Secondary | ICD-10-CM | POA: Diagnosis not present

## 2023-06-18 DIAGNOSIS — Z8673 Personal history of transient ischemic attack (TIA), and cerebral infarction without residual deficits: Secondary | ICD-10-CM | POA: Diagnosis not present

## 2023-06-18 DIAGNOSIS — E785 Hyperlipidemia, unspecified: Secondary | ICD-10-CM

## 2023-06-18 DIAGNOSIS — G253 Myoclonus: Secondary | ICD-10-CM

## 2023-06-18 DIAGNOSIS — R42 Dizziness and giddiness: Secondary | ICD-10-CM

## 2023-06-18 NOTE — Patient Instructions (Signed)
For secondary stroke prevention: -Continue aspirin 81 mg daily -Continue Crestor 10 mg daily  If you have new difficulty speaking, face droop, numbness on one side of the body, weakness on one side of the body, or dizziness/imbalance, this could be the sign of a stroke. Don't wait, please call EMS and be evaluated at the nearest emergency room.   I will see you back in clinic in about 6 months or sooner if needed.  Please let me know if you have any questions or concerns in the meantime.  The physicians and staff at [facility] are committed to providing excellent care. You may receive a survey requesting feedback about your experience at our office. We strive to receive "very good" responses to the survey questions. If you feel that your experience would prevent you from giving the office a "very good " response, please contact our office to try to remedy the situation. We may be reached at [phone]. Thank you for taking the time out of your busy day to complete the survey.  Jacquelyne Balint, MD [facility]

## 2023-06-19 ENCOUNTER — Encounter: Payer: Self-pay | Admitting: Neurology

## 2023-06-24 ENCOUNTER — Encounter: Payer: Self-pay | Admitting: Sports Medicine

## 2023-06-26 ENCOUNTER — Encounter: Payer: Self-pay | Admitting: Family Medicine

## 2023-06-26 ENCOUNTER — Other Ambulatory Visit: Payer: Self-pay | Admitting: Family Medicine

## 2023-06-26 ENCOUNTER — Ambulatory Visit (INDEPENDENT_AMBULATORY_CARE_PROVIDER_SITE_OTHER): Payer: Managed Care, Other (non HMO) | Admitting: Family Medicine

## 2023-06-26 VITALS — BP 128/86 | HR 91 | Ht 63.0 in | Wt 169.0 lb

## 2023-06-26 DIAGNOSIS — E1169 Type 2 diabetes mellitus with other specified complication: Secondary | ICD-10-CM

## 2023-06-26 DIAGNOSIS — M5431 Sciatica, right side: Secondary | ICD-10-CM | POA: Insufficient documentation

## 2023-06-26 DIAGNOSIS — G43809 Other migraine, not intractable, without status migrainosus: Secondary | ICD-10-CM | POA: Diagnosis not present

## 2023-06-26 DIAGNOSIS — R768 Other specified abnormal immunological findings in serum: Secondary | ICD-10-CM | POA: Diagnosis not present

## 2023-06-26 LAB — POCT GLYCOSYLATED HEMOGLOBIN (HGB A1C): HbA1c, POC (controlled diabetic range): 6.7 % (ref 0.0–7.0)

## 2023-06-26 MED ORDER — RYBELSUS 7 MG PO TABS: 7.0000 mg | ORAL_TABLET | Freq: Every day | ORAL | 3 refills | Status: DC

## 2023-06-26 MED ORDER — RYBELSUS 3 MG PO TABS: 3.0000 mg | ORAL_TABLET | Freq: Every day | ORAL | 0 refills | Status: DC

## 2023-06-26 NOTE — Patient Instructions (Signed)
Start 3mg  rybelsus x30 days then increase to 7mg .

## 2023-06-26 NOTE — Assessment & Plan Note (Addendum)
Currently seeing rheumatology.  Has not been formally diagnosed with lupus but strong suspicion.

## 2023-06-26 NOTE — Progress Notes (Signed)
Nancy Cochran - 47 y.o. female MRN [id number]  Date of birth: 06-Oct-1975  Subjective Chief Complaint  Patient presents with   Diabetes    HPI Nancy Cochran is a 47 y.o. female here today for follow up.   She reports that she is doing okay.   She remains on metformin for management of diabetes.  She reports that she is doing pretty well with this.  A1c today is increased at 6.7%.  She would be interested in trying something different for managing her diabetes.  She as been seeing Dr. Benjamin Stain for DDD of the spine.  Medications and injections have not really helped.  She has been referred to PT and elavil was increased.   She was also evaluated by rheumatology .  She was told she may have lupus but additional testing was needed.    Continues to see neurology for history of CVA.  She continues on statin and ASA.   ROS:  A comprehensive ROS was completed and negative except as noted per HPI  Allergies  Allergen Reactions   Gabapentin    Lyrica [Pregabalin]     Past Medical History:  Diagnosis Date   Abnormal sensation in ear, right 07/29/2022   Cerebellar infarct (HCC) 09/03/2022   Colitis 10/18/2021   DDD (degenerative disc disease), cervical 2016   Dr. 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    Past Surgical History:  Procedure Laterality Date   OTHER SURGICAL HISTORY     cyst removal shoulder    Social History   Socioeconomic History   Marital status: Married    Spouse name: Not on file   Number of children: 2   Years of education: Not on file   Highest education level: Not on  file  Occupational History   Occupation: Lawyer  Tobacco Use   Smoking status: Former    Current packs/day: 0.00    Average packs/day: 1 pack/day for 28.0 years (28.0 ttl pk-yrs)    Types: Cigarettes    Quit date: 09/12/2020    Years since quitting: 2.7    Passive exposure: Never   Smokeless tobacco: Never  Vaping Use   Vaping status: Never Used  Substance and Sexual Activity   Alcohol use: No   Drug use: No   Sexual activity: Yes    Partners: Male    Birth control/protection: Injection  Other Topics Concern   Not on file  Social History Narrative   Are you right handed or left handed? Right handed   Are you currently employed ? No       Do you live at home alone? Live with husband and Daughter      What type of home do you live in: 1 story or 2 story? One story   Caffeine 3-4 sodas a day       Social Determinants of Health   Financial Resource Strain: Not on file  Food Insecurity: Not on file  Transportation Needs: Not on file  Physical Activity: Not on file  Stress: Not on file  Social Connections: Unknown (10/15/2022)  Received from [employer], [facility]   Social Network    Social Network: Not on file    Family History  Problem Relation Age of Onset   Heart disease Father    Hypertension Maternal Grandmother    Heart attack Maternal Grandmother    Hypertension Maternal Grandfather    Heart attack Maternal Grandfather    Hypertension Paternal Grandmother    Heart attack Paternal Grandmother    Stroke Paternal Grandmother    Hypertension Paternal Grandfather    Heart attack Paternal Grandfather    Stroke Maternal Aunt    Diabetes Maternal Uncle    Breast cancer Neg Hx        Mothers family   Cancer Neg Hx        Fathers family    Health Maintenance  Topic Date Due   Diabetic kidney evaluation - Urine ACR  Never done   DTaP/Tdap/Td (1 - Tdap) Never done   Cervical Cancer Screening (HPV/Pap Cotest)  07/29/2022   INFLUENZA VACCINE   11/10/2023 (Originally 03/13/2023)   Hepatitis C Screening  03/25/2024 (Originally 06/26/1996)   OPHTHALMOLOGY EXAM  09/17/2023   MAMMOGRAM  10/26/2023   HEMOGLOBIN A1C  12/24/2023   Diabetic kidney evaluation - eGFR measurement  02/26/2024   FOOT EXAM  04/17/2024   HIV Screening  Completed   HPV VACCINES  Aged Out   COVID-19 Vaccine  Discontinued     ----------------------------------------------------------------------------------------------------------------------------------------------------------------------------------------------------------------- Physical Exam BP 128/86 (BP Location: Left Arm, Patient Position: Sitting, Cuff Size: Normal)   Pulse 91   Ht 5\' 3"  (1.6 m)   Wt 169 lb (76.7 kg)   SpO2 100%   BMI 29.94 kg/m   Physical Exam Constitutional:      Appearance: Normal appearance.  Eyes:     General: No scleral icterus. Cardiovascular:     Rate and Rhythm: Normal rate and regular rhythm.  Pulmonary:     Effort: Pulmonary effort is normal.     Breath sounds: Normal breath sounds.  Musculoskeletal:     Cervical back: Neck supple.  Neurological:     Mental Status: She is alert.  Psychiatric:        Mood and Affect: Mood normal.        Behavior: Behavior normal.     ------------------------------------------------------------------------------------------------------------------------------------------------------------------------------------------------------------------- Assessment and Plan  Type 2 diabetes mellitus with other specified complication (HCC) Diabetes control has worsened since last visit.  D/c metformin and start Rybelsus.  Given 3mg  sample and increase to 7mg  after 30 days.   Migraines Continue Elavil at current strength.  Nurtec as needed as well.  ANA positive Currently seeing rheumatology.  Has not been formally diagnosed with lupus but strong suspicion.   Meds ordered this encounter  Medications   Semaglutide (RYBELSUS) 3 MG  TABS    Sig: Take 1 tablet (3 mg total) by mouth daily.    Dispense:  30 tablet    Refill:  0    Exp: 03/11/24 Lot: [id number]   Semaglutide (RYBELSUS) 7 MG TABS    Sig: Take 1 tablet (7 mg total) by mouth daily.    Dispense:  30 tablet    Refill:  3    Return in about 3 months (around 09/26/2023) for Type 2 Diabetes.    This visit occurred during the SARS-CoV-2 public health emergency.  Safety protocols were in place, including screening questions prior to the visit, additional usage of staff PPE, and extensive cleaning of exam room while observing appropriate contact time as indicated  for disinfecting solutions.

## 2023-06-26 NOTE — Assessment & Plan Note (Signed)
Continue Elavil at current strength.  Nurtec as needed as well.

## 2023-06-26 NOTE — Assessment & Plan Note (Signed)
Diabetes control has worsened since last visit.  D/c metformin and start Rybelsus.  Given 3mg  sample and increase to 7mg  after 30 days.

## 2023-06-27 LAB — POCT UA - MICROALBUMIN
Albumin/Creatinine Ratio, Urine, POC: <30
Creatinine, POC: 100 mg/dL
Microalbumin Ur, POC: 10 mg/L

## 2023-07-02 ENCOUNTER — Encounter: Payer: Self-pay | Admitting: Family Medicine

## 2023-07-07 ENCOUNTER — Encounter: Payer: Self-pay | Admitting: Family Medicine

## 2023-07-08 ENCOUNTER — Telehealth: Payer: Self-pay

## 2023-07-08 NOTE — Telephone Encounter (Signed)
Initiated Prior authorization LOV:[id number] 7MG  tablets Via: Covermymeds Case/Key:[id number] Status: approved  as of 07/08/23 Reason:approved  through 07/07/24 Notified Pt via: Mychart

## 2023-07-09 ENCOUNTER — Encounter: Payer: Self-pay | Admitting: Family Medicine

## 2023-07-09 ENCOUNTER — Encounter: Payer: Self-pay | Admitting: Neurology

## 2023-07-14 ENCOUNTER — Encounter: Payer: Self-pay | Admitting: Family Medicine

## 2023-07-20 ENCOUNTER — Encounter: Payer: Self-pay | Admitting: Family Medicine

## 2023-07-21 ENCOUNTER — Encounter: Payer: Self-pay | Admitting: Neurology

## 2023-07-22 ENCOUNTER — Other Ambulatory Visit: Payer: Self-pay | Admitting: Neurology

## 2023-07-22 ENCOUNTER — Ambulatory Visit: Payer: Managed Care, Other (non HMO) | Admitting: Sports Medicine

## 2023-07-22 DIAGNOSIS — E785 Hyperlipidemia, unspecified: Secondary | ICD-10-CM

## 2023-07-27 ENCOUNTER — Encounter: Payer: Self-pay | Admitting: Family Medicine

## 2023-08-04 ENCOUNTER — Encounter: Payer: Self-pay | Admitting: Family Medicine

## 2023-08-04 DIAGNOSIS — M79676 Pain in unspecified toe(s): Secondary | ICD-10-CM

## 2023-08-14 ENCOUNTER — Other Ambulatory Visit: Payer: Self-pay | Admitting: Neurology

## 2023-08-14 DIAGNOSIS — R112 Nausea with vomiting, unspecified: Secondary | ICD-10-CM

## 2023-08-14 MED ORDER — ONDANSETRON 4 MG PO TBDP
4.0000 mg | ORAL_TABLET | Freq: Three times a day (TID) | ORAL | 3 refills | Status: AC | PRN
Start: 2023-08-14 — End: ?

## 2023-08-19 ENCOUNTER — Other Ambulatory Visit: Payer: Self-pay

## 2023-08-19 DIAGNOSIS — G43809 Other migraine, not intractable, without status migrainosus: Secondary | ICD-10-CM

## 2023-08-19 DIAGNOSIS — I6789 Other cerebrovascular disease: Secondary | ICD-10-CM

## 2023-08-20 ENCOUNTER — Encounter: Payer: Self-pay | Admitting: Family Medicine

## 2023-08-25 NOTE — Telephone Encounter (Signed)
 Message in referral that patient had refused [facility] due to location- she was requesting a referral to [place] instead.

## 2023-08-27 ENCOUNTER — Other Ambulatory Visit: Payer: Self-pay

## 2023-08-27 ENCOUNTER — Telehealth: Payer: Self-pay

## 2023-08-27 DIAGNOSIS — G43809 Other migraine, not intractable, without status migrainosus: Secondary | ICD-10-CM

## 2023-08-27 DIAGNOSIS — R42 Dizziness and giddiness: Secondary | ICD-10-CM

## 2023-08-27 NOTE — Telephone Encounter (Signed)
 Called and left message to call office for change in schedule. [facility] refused her so I am changing the PT to [facility].

## 2023-08-27 NOTE — Telephone Encounter (Signed)
 Called Pt and informed her of change in Therapy place to [facility] [address] [place],    [address]

## 2023-09-03 ENCOUNTER — Other Ambulatory Visit: Payer: Self-pay

## 2023-09-03 ENCOUNTER — Encounter: Payer: Self-pay | Admitting: Physical Therapy

## 2023-09-03 ENCOUNTER — Ambulatory Visit: Payer: Managed Care, Other (non HMO) | Attending: Neurology | Admitting: Physical Therapy

## 2023-09-03 DIAGNOSIS — R42 Dizziness and giddiness: Secondary | ICD-10-CM | POA: Diagnosis present

## 2023-09-03 DIAGNOSIS — I6789 Other cerebrovascular disease: Secondary | ICD-10-CM | POA: Diagnosis not present

## 2023-09-03 DIAGNOSIS — R2681 Unsteadiness on feet: Secondary | ICD-10-CM | POA: Diagnosis present

## 2023-09-03 DIAGNOSIS — G43809 Other migraine, not intractable, without status migrainosus: Secondary | ICD-10-CM | POA: Insufficient documentation

## 2023-09-03 NOTE — Therapy (Signed)
OUTPATIENT PHYSICAL THERAPY VESTIBULAR EVALUATION     Patient Name: Nancy Cochran MRN: [id number] DOB:February 27, 1976, 48 y.o., female Today's Date: 09/03/2023  END OF SESSION:  PT End of Session - 09/03/23 1040     Visit Number 1    Number of Visits 13    Date for PT Re-Evaluation 10/17/23    Authorization Type [employer]-90 visits combined (0 used)- *Pt did have PT at [facility], not sure how many visits    PT Start Time 0930    PT Stop Time 1024    PT Time Calculation (min) 54 min    Activity Tolerance Patient tolerated treatment well    Behavior During Therapy [facility] for tasks assessed/performed             Past Medical History:  Diagnosis Date   Abnormal sensation in ear, right 07/29/2022   Cerebellar infarct (HCC) 09/03/2022   Colitis 10/18/2021   DDD (degenerative disc disease), cervical 2016   Dr. 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   Past Surgical History:  Procedure Laterality Date   OTHER SURGICAL HISTORY     cyst removal shoulder   Patient Active Problem List   Diagnosis Date Noted   Sciatica of right side 06/26/2023   Allergic rhinitis 06/17/2023   Right lumbar radiculitis 06/10/2023   Raynaud's phenomenon without gangrene 03/26/2023   Elevated sed rate 03/26/2023   Left ankle swelling 03/26/2023   Disc disease, degenerative, lumbar or lumbosacral 03/26/2023   Chronic pain syndrome 03/26/2023   ANA positive 03/26/2023   Muscle pain 01/26/2023   Thrush 01/16/2023   Type 2 diabetes mellitus with other specified complication (HCC) 11/15/2022   Diabetes mellitus due to  underlying condition with unspecified complications (HCC) 10/30/2022   Paroxysmal SVT (supraventricular tachycardia) (HCC) 10/30/2022   Mixed dyslipidemia 10/30/2022   Obesity (BMI 30.0-34.9) 10/30/2022   Palpitations 10/30/2022   Cerebellar infarct (HCC) 09/03/2022   Tinnitus of both ears 09/03/2022   Skin lesion 07/29/2022   Hair loss 01/27/2022   Other fatigue 01/27/2022   Otitis externa 01/27/2022   History of cerebellar stroke 10/25/2021   Colitis 10/18/2021   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 10/18/2021   Migraines 01/03/2020   GAD (generalized anxiety disorder) 01/03/2020   DDD (degenerative disc disease), cervical 11/19/2019   Eczema 11/19/2019   Primary insomnia 11/19/2019   Pars defect 07/17/2017    PCP: Everrett Coombe, DO REFERRING PROVIDER: Antony Madura, MD   REFERRING DIAG:  [phone] (ICD-10-CM) - 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  It's in the front of my head, right behind my eyes; it comes and goes throughout the day, no matter what I'm doing.  Feels like I'm "drunk" for a brief time.  Did have an episode of vertigo and intense spinning in July 2024, and  it wasn't a new stroke; MD thought maybe BPPV. Pt accompanied by: self  PERTINENT HISTORY: cerebellar CVA (3/23), DM, migraines, chronic LBP, anxiety, pulsatile tinnitus ("thumping"), palatal myoclonus  PAIN:  Are you having pain? No  PRECAUTIONS: None  RED FLAGS: None   WEIGHT BEARING RESTRICTIONS: No  FALLS: Has patient fallen in last 6 months? No  LIVING ENVIRONMENT: Lives with: lives with their family Lives in: House/apartment Stairs:  one  level home Has following equipment at home: None  PLOF: Independent and Vocation/Vocational requirements: subs in school cafeteria  PATIENT GOALS: To improve the spinning sensation  OBJECTIVE:  Note: Objective measures were completed at Evaluation unless otherwise noted.  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 There was very mild contrast enhancement on follow up MRI with contrast. A repeat MRI brain w/wo contrast was recommended in 3 months. Repeat MRI brain on 05/26/23 showed no acute process and unchanged T2 hyperintensity in left ventral medulla with interval resolution of enhancement. MRI cervical spine on 03/09/23 showed only mild spinal stenosis.   COGNITION: Overall cognitive status: Within functional limits for tasks assessed    POSTURE:  rounded shoulders and forward head  Cervical ROM:    Active A/PROM (deg) eval  Flexion 40  Extension 28  Right lateral flexion   Left lateral flexion   Right rotation WFL  Left rotation WFL  (Blank rows = not tested)  BED MOBILITY:  Independent  TRANSFERS: Assistive device utilized: None  Sit to stand: Modified independence Stand to sit: Modified independence  GAIT: Gait pattern: step through pattern   PATIENT SURVEYS:  FOTO 48; predicted 59  VESTIBULAR ASSESSMENT:  GENERAL OBSERVATION: Pt appears somewhat guarded in movement   SYMPTOM BEHAVIOR:  Subjective history: Had CVA in 2023, had intense bout of vertigo July 2024 (MD thought BPPV, not new stroke).  Since November, pt reports episodes of dizziness that occur any time with any type of activity/movement throughout the day; dizziness is right behind her eyes and passes briefly.  She also describes episodes of "blackout over eyes" when coming up to sit from lying  down.  With her work in [employer], with repeated movements side to side or bending and coming up, she has almost nausea type feeling.  She does have hx of migraines; she has DM and reports she does not drink water, only Pepsi throughout the day.  Non-Vestibular symptoms: headaches, nausea/vomiting, migraine symptoms, and pulsatile tinnitus  Type of dizziness: Imbalance (Disequilibrium), Spinning/Vertigo, and Unsteady with head/body turns  Frequency: all throughout the day  Duration: seconds  Aggravating factors: Induced by position change: rolling to the right and rolling to the left and Induced by motion: bending down to the ground, turning body quickly, turning head quickly, and sitting in a moving car  Relieving factors: head stationary and slow movements  Progression of symptoms: unchanged  OCULOMOTOR EXAM:  Ocular Alignment: normal  Ocular ROM: No Limitations  Spontaneous Nystagmus: absent  Gaze-Induced Nystagmus: absent  Smooth Pursuits: intact  Saccades: intact and slow in vertical direction (brings on nausea 3/10)-settles after several minutes  Convergence/Divergence: 2-3 cm    VESTIBULAR - OCULAR REFLEX:   Slow VOR: Normal  VOR Cancellation: Comment: reports feels everything is spinning, 2-3/10  Head-Impulse Test: HIT Right: positive HIT Left: positive ("do not like this motion")  Dynamic Visual Acuity:  NA  POSITIONAL TESTING: Right Dix-Hallpike: no nystagmus and dizziness upon sitting up Left Dix-Hallpike: no nystagmus and no symptoms Right Roll Test: no nystagmus Left Roll Test: no nystagmus and saccadic eye motions as she turns from R to L, feeling mild "off"; upon sitting up EOM, she describes "black"   M-CTSIB  Condition 1: Firm Surface, EO 30 Sec, Normal Sway  Condition 2: Firm Surface, EC 30 Sec, Mild Sway  Condition 3: Foam Surface, EO 30 Sec, Mild Sway  Condition 4: Foam Surface, EC 30 Sec, Moderate Sway    MOTION SENSITIVITY:  Motion Sensitivity  Quotient Intensity: 0 = none, 1 = Lightheaded, 2 = Mild, 3 = Moderate, 4 = Severe, 5 = Vomiting  Intensity  1. Sitting to supine   2. Supine to L side   3. Supine to R side   4. Supine to sitting   5. L Hallpike-Dix   6. Up from L    7. R Hallpike-Dix   8. Up from R    9. Sitting, head tipped to L knee   10. Head up from L knee   11. Sitting, head tipped to R knee   12. Head up from R knee   13. Sitting head turns x5   14.Sitting head nods x5   15. In stance, 180 turn to L    16. In stance, 180 turn to R                                                                                                                                 TREATMENT DATE: 09/03/2023    Gaze Adaptation:  x1 Viewing Horizontal: Position: seated, Time: 30 sec, and Reps: 3 and x1 Viewing Vertical:  Position: seated, Time: 30 sec, and Reps: 3  PATIENT EDUCATION: Education details: Eval results, POC Person educated: Patient Education method: Explanation Education comprehension: verbalized understanding  HOME EXERCISE PROGRAM:  Access Code: [email or web address] URL: [email or web address] Date: 09/03/2023 Prepared by: [facility] - Outpatient  Rehab - [facility]  Exercises - Seated Gaze Stabilization with Head Rotation  - 1 x daily - 7 x weekly - 1 sets - 3 reps - 30 sec hold - Seated Gaze Stabilization with Head Nod  - 1 x daily - 7 x weekly - 1 sets - 3 reps - 30 sec hold   GOALS: Goals reviewed with patient? Yes  SHORT TERM GOALS: Target date: 09/19/2023  Pt will be independent with HEP for improved dizziness symptoms. Baseline: Goal status: INITIAL  LONG TERM GOALS: Target date: 10/17/2023  Pt will be independent with progression of HEP for improved dizziness symptoms. Baseline:  Goal status: INITIAL  2.  Pt will report 0/10 dizziness with bed mobility and work tasks. Baseline: 2-3/10 Goal status: INITIAL  3.  FOTO score to improve to 59 for improved overall dizziness. Baseline:  48 Goal status: INITIAL  4.  Pt to improve Condition 4 on MCTSIB to mild sway  or less for improved balance, vestibular system use for balance. Baseline: mod sway Goal status: INITIAL   ASSESSMENT:  CLINICAL IMPRESSION: Patient is a 48 y.o. female who was seen today for physical therapy evaluation and treatment for dizziness and vertigo. She had CVA in 2023, had intense bout of vertigo July 2024 (MD thought BPPV, not new stroke).  Since November, pt reports episodes of dizziness that occur any time with any type of activity/movement throughout the day; dizziness is right behind her eyes and passes briefly.  She also describes episodes of "blackout over eyes" when coming up to sit from lying down.  With her work in [employer], with repeated movements side to side or bending and coming up, she has almost nausea type feeling.  With oculomotor testing today, she has slowed vertical saccades, with increased symptoms.  She has slowed VOR motions and with VOR cancellation, she has slowed motion and increased symptoms.  With positional testing, she does not have any nystagmus or symptoms; however, she does have saccadic eye motions with R>L roll position and slight symptoms here.  She reports symptoms of "black out" briefly upon sitting up.  She demo mod sway on Condition 4 of MCTSIB.  She would benefit from skilled PT to address overall motion sensitivity, VOR and decreased vestibular system use for balance, to improve overall dizziness and functional mobility.  OBJECTIVE IMPAIRMENTS: decreased balance, dizziness, and postural dysfunction.   ACTIVITY LIMITATIONS: bending, standing, bed mobility, and caring for others  PARTICIPATION LIMITATIONS: meal prep, community activity, and occupation  PERSONAL FACTORS: 3+ comorbidities: see PMH  are also affecting patient's functional outcome.   REHAB POTENTIAL: Good  CLINICAL DECISION MAKING: Stable/uncomplicated  EVALUATION COMPLEXITY:  Low   PLAN:  PT FREQUENCY: 1-2x/week  PT DURATION: 6 weeks plus eval  PLANNED INTERVENTIONS: 97110-Therapeutic exercises, 97530- Therapeutic activity, [id number]- Neuromuscular re-education, 97535- Self Care, [id number]- Manual therapy, [id number]- Canalith repositioning, Patient/Family education, Balance training, and Vestibular training  PLAN FOR NEXT SESSION: Review initial HEP; re-assess positional testing (sidelying test) and give Brandt-Daroff for habituation.  Check motion sensitivity quotient; May need to check orthostatic BP measures.  Habituation, VOR, corner balance exercises when appropriate.   Gean Maidens., PT 09/03/2023, 10:42 AM   [facility] at [facility] [address] [place], [address] [place], [place] [address] Phone # [phone] Fax # [phone]

## 2023-09-10 ENCOUNTER — Ambulatory Visit: Payer: Managed Care, Other (non HMO) | Admitting: Physical Therapy

## 2023-09-10 ENCOUNTER — Encounter: Payer: Self-pay | Admitting: Physical Therapy

## 2023-09-10 DIAGNOSIS — R42 Dizziness and giddiness: Secondary | ICD-10-CM | POA: Diagnosis not present

## 2023-09-10 DIAGNOSIS — R2681 Unsteadiness on feet: Secondary | ICD-10-CM

## 2023-09-10 NOTE — Therapy (Signed)
OUTPATIENT PHYSICAL THERAPY VESTIBULAR TREATMENT NOTE     Patient Name: Nancy Cochran MRN: [id number] DOB:12-22-1975, 48 y.o., female Today's Date: 09/10/2023  END OF SESSION:  PT End of Session - 09/10/23 0928     Visit Number 2    Number of Visits 13    Date for PT Re-Evaluation 10/17/23    Authorization Type [employer]-90 visits combined (0 used)- *Pt did have PT at [facility], not sure how many visits    PT Start Time 0932    PT Stop Time 1014    PT Time Calculation (min) 42 min    Activity Tolerance Patient tolerated treatment well    Behavior During Therapy [facility] for tasks assessed/performed              Past Medical History:  Diagnosis Date   Abnormal sensation in ear, right 07/29/2022   Cerebellar infarct (HCC) 09/03/2022   Colitis 10/18/2021   DDD (degenerative disc disease), cervical 2016   Dr. 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   Past Surgical History:  Procedure Laterality Date   OTHER SURGICAL HISTORY     cyst removal shoulder   Patient Active Problem List   Diagnosis Date Noted   Sciatica of right side 06/26/2023   Allergic rhinitis 06/17/2023   Right lumbar radiculitis 06/10/2023   Raynaud's phenomenon without gangrene 03/26/2023   Elevated sed rate 03/26/2023   Left ankle swelling 03/26/2023   Disc disease, degenerative, lumbar or lumbosacral 03/26/2023   Chronic pain syndrome 03/26/2023   ANA positive 03/26/2023   Muscle pain 01/26/2023   Thrush 01/16/2023   Type 2 diabetes mellitus with other specified complication (HCC) 11/15/2022   Diabetes mellitus due to  underlying condition with unspecified complications (HCC) 10/30/2022   Paroxysmal SVT (supraventricular tachycardia) (HCC) 10/30/2022   Mixed dyslipidemia 10/30/2022   Obesity (BMI 30.0-34.9) 10/30/2022   Palpitations 10/30/2022   Cerebellar infarct (HCC) 09/03/2022   Tinnitus of both ears 09/03/2022   Skin lesion 07/29/2022   Hair loss 01/27/2022   Other fatigue 01/27/2022   Otitis externa 01/27/2022   History of cerebellar stroke 10/25/2021   Colitis 10/18/2021   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 10/18/2021   Migraines 01/03/2020   GAD (generalized anxiety disorder) 01/03/2020   DDD (degenerative disc disease), cervical 11/19/2019   Eczema 11/19/2019   Primary insomnia 11/19/2019   Pars defect 07/17/2017    PCP: Everrett Coombe, DO REFERRING PROVIDER: Antony Madura, MD   REFERRING DIAG:  [phone] (ICD-10-CM) - 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  Keep getting the dizziness.  Get woozy and not balanced.  Pt accompanied by: self  PERTINENT HISTORY: cerebellar CVA (3/23), DM, migraines, chronic LBP, anxiety, pulsatile tinnitus ("thumping"), palatal myoclonus  PAIN:  Are you having pain? No  PRECAUTIONS: None  RED FLAGS: None   WEIGHT BEARING RESTRICTIONS: No  FALLS: Has patient fallen in last 6 months? No  LIVING ENVIRONMENT: Lives with: lives with their family Lives in: House/apartment Stairs:  one level home Has following equipment at home: None  PLOF: Independent and Vocation/Vocational requirements: subs in school cafeteria  PATIENT GOALS: To improve the spinning sensation  OBJECTIVE:   TODAY'S TREATMENT:  09/10/2023 Activity Comments  Reviewed HEP Pt has actually been doing VOR cancellation  Nose to L knee and return to upright sitting, 3-5 reps, 3 sets 1/10 symptoms; 2/10, then 1-2/10  Orthostatic BP measures: Supine 123/87, HR 86 bpm Standing 1 min:  114/91 HR 102 bpm Standing 3 min:  123/93, HR 95   Feet apart EC head steady x 30 sec EC head turns/nods 5 reps Iight UE support  Side step R and L, 2 x 5 reps Mild unsteadiness sensation      MOTION SENSITIVITY:  Motion Sensitivity Quotient Intensity: 0 = none, 1 = Lightheaded, 2 = Mild, 3 = Moderate, 4 = Severe, 5 = Vomiting  Intensity  1. Sitting to supine 0  2. Supine to L side 1-2 (rare)  3. Supine to R side 0  4. Supine to sitting 0  5. L Hallpike-Dix   6. Up from L    7. R Hallpike-Dix   8. Up from R    9. Sitting, head tipped to L knee 0  10. Head up from L knee 1  11. Sitting, head tipped to R knee 0  12. Head up from R knee 1  13. Sitting head turns x5 2  14.Sitting head nods x5 2  15. In stance, 180 turn to L  0  16. In stance, 180 turn to R 0   HOME EXERCISE PROGRAM Access Code: [facility] URL: [email or web address] Date: 09/10/2023 Prepared by: [facility] - Outpatient  Rehab - [facility]  Exercises - Seated Nose to Left Knee Vestibular Habituation  - 1-2 x daily - 7 x weekly - 3 sets - 3-5 reps - Seated Nose to Right Knee Vestibular Habituation  - 1 x daily - 7 x weekly - 3 sets - 10 reps - Standing Balance in Corner with Eyes Closed  - 1-2 x daily - 7 x weekly - 1 sets - 5 reps - Side Stepping with Counter Support  - 1-2 x daily - 7 x weekly - 1-2 sets - 10 reps  PATIENT EDUCATION: Education details: HEP additions; rationale for habituation exercises Person educated: Patient Education method: Explanation, Demonstration, and Handouts Education comprehension: verbalized understanding, returned demonstration, and needs further  education  ------------------------------------------------------ Note: Objective measures were completed at Evaluation unless otherwise noted.  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 There was very mild contrast enhancement on follow up MRI with contrast. A repeat MRI brain w/wo contrast was recommended in 3 months. Repeat MRI brain on 05/26/23 showed no acute process and unchanged T2 hyperintensity in left ventral medulla with interval resolution of enhancement. MRI cervical spine on 03/09/23 showed only mild spinal stenosis.   COGNITION: Overall cognitive status: Within functional limits for tasks assessed    POSTURE:  rounded shoulders and forward head  Cervical ROM:    Active A/PROM (deg) eval  Flexion 40  Extension 28  Right lateral flexion   Left lateral flexion  Right rotation WFL  Left rotation WFL  (Blank rows = not tested)  BED MOBILITY:  Independent  TRANSFERS: Assistive device utilized: None  Sit to stand: Modified independence Stand to sit: Modified independence  GAIT: Gait pattern: step through pattern   PATIENT SURVEYS:  FOTO 48; predicted 59  VESTIBULAR ASSESSMENT:  GENERAL OBSERVATION: Pt appears somewhat guarded in movement   SYMPTOM BEHAVIOR:  Subjective history: Had CVA in 2023, had intense bout of vertigo July 2024 (MD thought BPPV, not new stroke).  Since November, pt reports episodes of dizziness that occur any time with any type of activity/movement throughout the day; dizziness is right behind her eyes and passes briefly.  She also describes episodes of "blackout over eyes" when coming up to sit from lying down.  With her work in [employer], with repeated movements side to side or bending and coming up, she has almost nausea type  feeling.  She does have hx of migraines; she has DM and reports she does not drink water, only Pepsi throughout the day.  Non-Vestibular symptoms: headaches, nausea/vomiting, migraine symptoms, and pulsatile tinnitus  Type of dizziness: Imbalance (Disequilibrium), Spinning/Vertigo, and Unsteady with head/body turns  Frequency: all throughout the day  Duration: seconds  Aggravating factors: Induced by position change: rolling to the right and rolling to the left and Induced by motion: bending down to the ground, turning body quickly, turning head quickly, and sitting in a moving car  Relieving factors: head stationary and slow movements  Progression of symptoms: unchanged  OCULOMOTOR EXAM:  Ocular Alignment: normal  Ocular ROM: No Limitations  Spontaneous Nystagmus: absent  Gaze-Induced Nystagmus: absent  Smooth Pursuits: intact  Saccades: intact and slow in vertical direction (brings on nausea 3/10)-settles after several minutes  Convergence/Divergence: 2-3 cm    VESTIBULAR - OCULAR REFLEX:   Slow VOR: Normal  VOR Cancellation: Comment: reports feels everything is spinning, 2-3/10  Head-Impulse Test: HIT Right: positive HIT Left: positive ("do not like this motion")  Dynamic Visual Acuity:  NA   POSITIONAL TESTING: Right Dix-Hallpike: no nystagmus and dizziness upon sitting up Left Dix-Hallpike: no nystagmus and no symptoms Right Roll Test: no nystagmus Left Roll Test: no nystagmus and saccadic eye motions as she turns from R to L, feeling mild "off"; upon sitting up EOM, she describes "black"   M-CTSIB  Condition 1: Firm Surface, EO 30 Sec, Normal Sway  Condition 2: Firm Surface, EC 30 Sec, Mild Sway  Condition 3: Foam Surface, EO 30 Sec, Mild Sway  Condition 4: Foam Surface, EC 30 Sec, Moderate Sway                                                                                                                                TREATMENT DATE: 09/03/2023    Gaze  Adaptation:  x1 Viewing Horizontal: Position: seated, Time: 30 sec, and Reps: 3 and x1 Viewing Vertical:  Position: seated, Time: 30 sec, and Reps:  3  PATIENT EDUCATION: Education details: Eval results, POC Person educated: Patient Education method: Explanation Education comprehension: verbalized understanding  HOME EXERCISE PROGRAM:  Access Code: [email or web address] URL: [email or web address] Date: 09/03/2023 Prepared by: [facility] - Outpatient  Rehab - [facility]  Exercises - Seated Gaze Stabilization with Head Rotation  - 1 x daily - 7 x weekly - 1 sets - 3 reps - 30 sec hold - Seated Gaze Stabilization with Head Nod  - 1 x daily - 7 x weekly - 1 sets - 3 reps - 30 sec hold   GOALS: Goals reviewed with patient? Yes  SHORT TERM GOALS: Target date: 09/19/2023  Pt will be independent with HEP for improved dizziness symptoms. Baseline: Goal status: IN PROGRESS  LONG TERM GOALS: Target date: 10/17/2023  Pt will be independent with progression of HEP for improved dizziness symptoms. Baseline:  Goal status: IN PROGRESS  2.  Pt will report 0/10 dizziness with bed mobility and work tasks. Baseline: 2-3/10 Goal status: IN PROGRESS  3.  FOTO score to improve to 59 for improved overall dizziness. Baseline: 48 Goal status: IN PROGRESS  4.  Pt to improve Condition 4 on MCTSIB to mild sway or less for improved balance, vestibular system use for balance. Baseline: mod sway Goal status: IN PROGRESS   ASSESSMENT:  CLINICAL IMPRESSION: Pt presents today with reports of continued dizziness, especially with work-related tasks. She does report trying exercises at home, but she demo doing it as VOR cancellation exercise.  Did review VOR x 1 as well and in both exercises, she has feeling of unsteadiness with horizontal head motion, that brings on nausea, so discontinued these exercises.  Looked at motion sensitivity quotient and provided exercises to address, as well as vestibular  system retraining exercises with EC and head motions.  Orthostatic BP measures taken, with no significant change to these measures.  Pt will continue to benefit from skilled PT towards goals for improved functional mobility and decreased fall risk.   OBJECTIVE IMPAIRMENTS: decreased balance, dizziness, and postural dysfunction.   ACTIVITY LIMITATIONS: bending, standing, bed mobility, and caring for others  PARTICIPATION LIMITATIONS: meal prep, community activity, and occupation  PERSONAL FACTORS: 3+ comorbidities: see PMH  are also affecting patient's functional outcome.   REHAB POTENTIAL: Good  CLINICAL DECISION MAKING: Stable/uncomplicated  EVALUATION COMPLEXITY: Low   PLAN:  PT FREQUENCY: 1-2x/week  PT DURATION: 6 weeks plus eval  PLANNED INTERVENTIONS: 97110-Therapeutic exercises, 97530- Therapeutic activity, [id number]- Neuromuscular re-education, 97535- Self Care, [id number]- Manual therapy, [phone]- Canalith repositioning, Patient/Family education, Balance training, and Vestibular training  PLAN FOR NEXT SESSION: Review HEP;  Habituation, VOR, corner balance exercises when appropriate.   Gean Maidens., PT 09/10/2023, 10:19 AM   [facility] at [facility] [address] [place], [address] [place], [place] [address] Phone # [phone] Fax # [phone]

## 2023-09-11 NOTE — Therapy (Signed)
OUTPATIENT PHYSICAL THERAPY VESTIBULAR TREATMENT NOTE     Patient Name: Nancy Cochran MRN: [id number] DOB:Apr 04, 1976, 48 y.o., female Today's Date: 09/15/2023  END OF SESSION:  PT End of Session - 09/15/23 1057     Visit Number 3    Number of Visits 13    Date for PT Re-Evaluation 10/17/23    Authorization Type [employer]-90 visits combined (0 used)- *Pt did have PT at [facility], not sure how many visits    PT Start Time 1019    PT Stop Time 1058    PT Time Calculation (min) 39 min    Equipment Utilized During Treatment Gait belt    Activity Tolerance Patient tolerated treatment well    Behavior During Therapy WFL for tasks assessed/performed               Past Medical History:  Diagnosis Date   Abnormal sensation in ear, right 07/29/2022   Cerebellar infarct (HCC) 09/03/2022   Colitis 10/18/2021   DDD (degenerative disc disease), cervical 2016   Dr. 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   Past Surgical History:  Procedure Laterality Date   OTHER SURGICAL HISTORY     cyst removal shoulder   Patient Active Problem List   Diagnosis Date Noted   Sciatica of right side 06/26/2023   Allergic rhinitis 06/17/2023   Right lumbar radiculitis 06/10/2023   Raynaud's phenomenon without gangrene 03/26/2023   Elevated sed rate 03/26/2023   Left ankle swelling 03/26/2023   Disc disease, degenerative, lumbar or lumbosacral 03/26/2023   Chronic pain syndrome 03/26/2023   ANA positive 03/26/2023   Muscle pain 01/26/2023   Thrush 01/16/2023   Type 2 diabetes mellitus with other specified  complication (HCC) 11/15/2022   Diabetes mellitus due to underlying condition with unspecified complications (HCC) 10/30/2022   Paroxysmal SVT (supraventricular tachycardia) (HCC) 10/30/2022   Mixed dyslipidemia 10/30/2022   Obesity (BMI 30.0-34.9) 10/30/2022   Palpitations 10/30/2022   Cerebellar infarct (HCC) 09/03/2022   Tinnitus of both ears 09/03/2022   Skin lesion 07/29/2022   Hair loss 01/27/2022   Other fatigue 01/27/2022   Otitis externa 01/27/2022   History of cerebellar stroke 10/25/2021   Colitis 10/18/2021   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 10/18/2021   Migraines 01/03/2020   GAD (generalized anxiety disorder) 01/03/2020   DDD (degenerative disc disease), cervical 11/19/2019   Eczema 11/19/2019   Primary insomnia 11/19/2019   Pars defect 07/17/2017    PCP: Everrett Coombe, DO REFERRING PROVIDER: Antony Madura, MD   REFERRING DIAG:  [phone] (ICD-10-CM) - 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 Reports that going back and forth at work or moving head up/down when crocheting gets her dizzy.   Pt accompanied by: self  PERTINENT HISTORY: cerebellar CVA (3/23),  DM, migraines, chronic LBP, anxiety, pulsatile tinnitus ("thumping"), palatal myoclonus  PAIN:  Are you having pain? No "nothing other than the normal stuff"  PRECAUTIONS: None  RED FLAGS: None   WEIGHT BEARING RESTRICTIONS: No  FALLS: Has patient fallen in last 6 months? No  LIVING ENVIRONMENT: Lives with: lives with their family Lives in: House/apartment Stairs:  one level home Has following equipment at home:  None  PLOF: Independent and Vocation/Vocational requirements: subs in school cafeteria  PATIENT GOALS: To improve the spinning sensation  OBJECTIVE:     TODAY'S TREATMENT: 09/15/23 Activity Comments  standing head turns to targets 2x30" Behind chair for safety; 1-2/10 dizziness   standing head nods to targets 2x30"  1-2/10 dizziness and mild sway; report of 2-3/10 dizziness after encouraging slightly quicker pace   1/2 turns to targets Chair nearby; c/o wooziness upon stopping   standing D2 flexion to cone on floor 5x each  Tolerated well       PATIENT EDUCATION: Education details: answered pt's questions about her condition; detailed edu on basis of habituation, intended level of symptoms of exercises and duration of rehab for max; HEP update  Person educated: Patient Education method: Explanation, Demonstration, Tactile cues, Verbal cues, and Handouts Education comprehension: verbalized understanding and returned demonstration    HOME EXERCISE PROGRAM Access Code: [facility] URL: [email or web address] Date: 09/15/2023 Prepared by: [facility] - Outpatient  Rehab - [facility]  Exercises - Seated Nose to Left Knee Vestibular Habituation  - 1-2 x daily - 7 x weekly - 3 sets - 3-5 reps - Seated Nose to Right Knee Vestibular Habituation  - 1 x daily - 7 x weekly - 3 sets - 10 reps - Standing Balance in Corner with Eyes Closed  - 1-2 x daily - 7 x weekly - 1 sets - 5 reps - Side Stepping with Counter Support  - 1-2 x daily - 7 x weekly - 1-2 sets - 10 reps - Standing with Head Rotation  - 1-2 x daily - 5 x weekly - 2-3 sets - 30 sec hold - Standing with Head Nod  - 1-2 x daily - 5 x weekly - 2-3 sets - 30 sec hold   ------------------------------------------------------ Note: Objective measures were completed at Evaluation unless otherwise noted.  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 There was very mild contrast enhancement on follow up MRI with contrast. A repeat MRI brain w/wo contrast was recommended in 3 months. Repeat MRI brain on 05/26/23 showed no acute process and unchanged T2 hyperintensity in left ventral medulla with interval resolution of enhancement. MRI cervical spine on 03/09/23 showed only mild spinal stenosis.   COGNITION: Overall cognitive status: Within functional limits for tasks assessed    POSTURE:  rounded shoulders and forward head  Cervical ROM:    Active A/PROM (deg) eval  Flexion 40  Extension 28  Right lateral flexion   Left lateral flexion   Right rotation WFL  Left rotation WFL  (Blank rows = not tested)  BED MOBILITY:  Independent  TRANSFERS: Assistive device utilized: None  Sit to stand: Modified independence Stand to sit: Modified independence  GAIT: Gait pattern: step through pattern   PATIENT SURVEYS:  FOTO 48; predicted 59  VESTIBULAR ASSESSMENT:  GENERAL OBSERVATION: Pt appears somewhat guarded in movement   SYMPTOM BEHAVIOR:  Subjective history:  Had CVA in 2023, had intense bout of vertigo July 2024 (MD thought BPPV, not new stroke).  Since November, pt reports episodes of dizziness that occur any time with any type of activity/movement throughout the day; dizziness is right behind her eyes and passes briefly.  She also describes episodes of "blackout over eyes" when coming up to sit from lying down.  With her work in [employer], with repeated movements side to side or bending and coming up, she has almost nausea type feeling.  She does have hx of migraines; she has DM and reports she does not drink water, only Pepsi throughout the day.  Non-Vestibular symptoms: headaches, nausea/vomiting, migraine symptoms, and pulsatile tinnitus  Type of dizziness: Imbalance (Disequilibrium),  Spinning/Vertigo, and Unsteady with head/body turns  Frequency: all throughout the day  Duration: seconds  Aggravating factors: Induced by position change: rolling to the right and rolling to the left and Induced by motion: bending down to the ground, turning body quickly, turning head quickly, and sitting in a moving car  Relieving factors: head stationary and slow movements  Progression of symptoms: unchanged  OCULOMOTOR EXAM:  Ocular Alignment: normal  Ocular ROM: No Limitations  Spontaneous Nystagmus: absent  Gaze-Induced Nystagmus: absent  Smooth Pursuits: intact  Saccades: intact and slow in vertical direction (brings on nausea 3/10)-settles after several minutes  Convergence/Divergence: 2-3 cm    VESTIBULAR - OCULAR REFLEX:   Slow VOR: Normal  VOR Cancellation: Comment: reports feels everything is spinning, 2-3/10  Head-Impulse Test: HIT Right: positive HIT Left: positive ("do not like this motion")  Dynamic Visual Acuity:  NA   POSITIONAL TESTING: Right Dix-Hallpike: no nystagmus and dizziness upon sitting up Left Dix-Hallpike: no nystagmus and no symptoms Right Roll Test: no nystagmus Left Roll Test: no nystagmus and saccadic eye motions as she turns from R to L, feeling mild "off"; upon sitting up EOM, she describes "black"   M-CTSIB  Condition 1: Firm Surface, EO 30 Sec, Normal Sway  Condition 2: Firm Surface, EC 30 Sec, Mild Sway  Condition 3: Foam Surface, EO 30 Sec, Mild Sway  Condition 4: Foam Surface, EC 30 Sec, Moderate Sway                                                                                                                                TREATMENT DATE: 09/03/2023    Gaze Adaptation:  x1 Viewing Horizontal: Position: seated, Time: 30 sec, and Reps: 3 and x1 Viewing Vertical:  Position: seated, Time: 30 sec, and Reps: 3  PATIENT EDUCATION: Education details: Eval results, POC Person educated: Patient Education method: Explanation Education  comprehension: verbalized understanding  HOME EXERCISE PROGRAM:  Access Code: [email or web address] URL: [email or web address] Date: 09/03/2023 Prepared by: [facility] - Outpatient  Rehab - [facility]  Exercises - Seated Gaze Stabilization with Head Rotation  - 1 x daily - 7 x weekly - 1 sets - 3 reps - 30 sec hold -  Seated Gaze Stabilization with Head Nod  - 1 x daily - 7 x weekly - 1 sets - 3 reps - 30 sec hold   GOALS: Goals reviewed with patient? Yes  SHORT TERM GOALS: Target date: 09/19/2023  Pt will be independent with HEP for improved dizziness symptoms. Baseline: Goal status: IN PROGRESS  LONG TERM GOALS: Target date: 10/17/2023  Pt will be independent with progression of HEP for improved dizziness symptoms. Baseline:  Goal status: IN PROGRESS  2.  Pt will report 0/10 dizziness with bed mobility and work tasks. Baseline: 2-3/10 Goal status: IN PROGRESS  3.  FOTO score to improve to 59 for improved overall dizziness. Baseline: 48 Goal status: IN PROGRESS  4.  Pt to improve Condition 4 on MCTSIB to mild sway or less for improved balance, vestibular system use for balance. Baseline: mod sway Goal status: IN PROGRESS   ASSESSMENT:  CLINICAL IMPRESSION: Patient arrived to session with report of some reduction in sx since not working recently. Reports remaining dizziness with head nods and turning tasks. This was simulated with standing gaze stabilization activities. Session incorporated detailed edu on specifics of vestibular rehab and recommended frequency, intensity, duration of HEP. Some unsteadiness was evident with turns and bending tasks but with good ability to redirect balance with light UE support. Patient tolerated session well and without complaints upon leaving.   OBJECTIVE IMPAIRMENTS: decreased balance, dizziness, and postural dysfunction.   ACTIVITY LIMITATIONS: bending, standing, bed mobility, and caring for others  PARTICIPATION LIMITATIONS: meal  prep, community activity, and occupation  PERSONAL FACTORS: 3+ comorbidities: see PMH  are also affecting patient's functional outcome.   REHAB POTENTIAL: Good  CLINICAL DECISION MAKING: Stable/uncomplicated  EVALUATION COMPLEXITY: Low   PLAN:  PT FREQUENCY: 1-2x/week  PT DURATION: 6 weeks plus eval  PLANNED INTERVENTIONS: 97110-Therapeutic exercises, 97530- Therapeutic activity, [id number]- Neuromuscular re-education, 97535- Self Care, 16109- Manual therapy, [phone]- Canalith repositioning, Patient/Family education, Balance training, and Vestibular training  PLAN FOR NEXT SESSION: Review HEP;  Habituation, VOR, corner balance exercises when appropriate.    Baldemar Friday, PT, DPT 09/15/23 10:58 AM  [facility] [address] [place], [address] [place], [place] [address] Phone # [phone] Fax # [phone]

## 2023-09-15 ENCOUNTER — Encounter: Payer: Self-pay | Admitting: Physical Therapy

## 2023-09-15 ENCOUNTER — Ambulatory Visit: Payer: Managed Care, Other (non HMO) | Attending: Neurology | Admitting: Physical Therapy

## 2023-09-15 DIAGNOSIS — R42 Dizziness and giddiness: Secondary | ICD-10-CM | POA: Diagnosis present

## 2023-09-15 DIAGNOSIS — R2681 Unsteadiness on feet: Secondary | ICD-10-CM | POA: Diagnosis present

## 2023-09-16 ENCOUNTER — Other Ambulatory Visit: Payer: Self-pay | Admitting: Family Medicine

## 2023-09-16 DIAGNOSIS — F5101 Primary insomnia: Secondary | ICD-10-CM

## 2023-09-16 NOTE — Therapy (Signed)
 OUTPATIENT PHYSICAL THERAPY VESTIBULAR TREATMENT NOTE     Patient Name: Nancy Cochran MRN: [id number] DOB:1976-02-22, 48 y.o., female Today's Date: 09/17/2023  END OF SESSION:  PT End of Session - 09/17/23 1310     Visit Number 4    Number of Visits 13    Date for PT Re-Evaluation 10/17/23    Authorization Type [employer]-90 visits combined (0 used)- *Pt did have PT at [facility], not sure how many visits    PT Start Time 1230    PT Stop Time 1311    PT Time Calculation (min) 41 min    Equipment Utilized During Treatment Gait belt    Activity Tolerance Patient tolerated treatment well    Behavior During Therapy WFL for tasks assessed/performed                Past Medical History:  Diagnosis Date   Abnormal sensation in ear, right 07/29/2022   Cerebellar infarct (HCC) 09/03/2022   Colitis 10/18/2021   DDD (degenerative disc disease), cervical 2016   Dr. 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 10/18/2021   Past Surgical History:  Procedure Laterality Date   OTHER SURGICAL HISTORY     cyst removal shoulder   Patient Active Problem List   Diagnosis Date Noted   Sciatica of right side 06/26/2023   Allergic rhinitis 06/17/2023   Right lumbar radiculitis 06/10/2023   Raynaud's phenomenon without gangrene 03/26/2023   Elevated sed rate 03/26/2023   Left ankle swelling 03/26/2023   Disc disease, degenerative, lumbar or lumbosacral 03/26/2023   Chronic pain syndrome 03/26/2023   ANA positive 03/26/2023   Muscle pain 01/26/2023   Thrush 01/16/2023   Type 2 diabetes mellitus with other specified  complication (HCC) 11/15/2022   Diabetes mellitus due to underlying condition with unspecified complications (HCC) 10/30/2022   Paroxysmal SVT (supraventricular tachycardia) (HCC) 10/30/2022   Mixed dyslipidemia 10/30/2022   Obesity (BMI 30.0-34.9) 10/30/2022   Palpitations 10/30/2022   Cerebellar infarct (HCC) 09/03/2022   Tinnitus of both ears 09/03/2022   Skin lesion 07/29/2022   Hair loss 01/27/2022   Other fatigue 01/27/2022   Otitis externa 01/27/2022   History of cerebellar stroke 10/25/2021   Colitis 10/18/2021   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 10/18/2021   Migraines 01/03/2020   GAD (generalized anxiety disorder) 01/03/2020   DDD (degenerative disc disease), cervical 11/19/2019   Eczema 11/19/2019   Primary insomnia 11/19/2019   Pars defect 07/17/2017    PCP: Alvia Bring, DO REFERRING PROVIDER: Leigh Venetia CROME, MD   REFERRING DIAG:  [id number] (ICD-10-CM) - 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   Pt accompanied by: self  PERTINENT HISTORY: cerebellar CVA (3/23), DM, migraines, chronic LBP, anxiety, pulsatile tinnitus (thumping), palatal myoclonus  PAIN:  Are you having pain? No   PRECAUTIONS: None  RED FLAGS: None  WEIGHT BEARING RESTRICTIONS: No  FALLS: Has patient fallen in last 6 months? No  LIVING ENVIRONMENT: Lives with: lives with their family Lives in: House/apartment Stairs:  one level home Has following equipment at home: None  PLOF: Independent and Vocation/Vocational requirements: subs in school cafeteria  PATIENT GOALS: To improve the spinning sensation  OBJECTIVE:     TODAY'S TREATMENT: 09/17/23 Activity Comments   1/2 turns to targets, then added toe taps on cone Tolerated turns with less instability and no dizziness; did c/o dizziness when adding in toe taps   fwd/back stepping, then added head nods No UE support, then added support with addition of head nods d/t instability. No dizziness   Toe taps to 2, then 3 cones. Then double tap More instability when stabilizing on R LE but quick improvement with practice   gait + ball toss vertical/horizontal Limited coordination when tossing, tendency to perform discontinuous steps with addition of dual task activity. Not able to tolerate full duration of horizontal toss d/t c/o dizziness- this task was also much more challenging to coordination   standing head nods to targets 2x30 Report of 1-2/10 dizziness when advised to quicken pace   standing head turns to targets 2x30  Report of 2-3/10 dizziness when advised to quicken pace     PATIENT EDUCATION: Education details: reviewed/summarized pt's performance with today's activities- she tends to get dizzier with quicker pace and more complex movements  Person educated: Patient Education method: Explanation Education comprehension: verbalized understanding    HOME EXERCISE PROGRAM Access Code: [employer] URL: [email or web address] Date: 09/15/2023 Prepared by: [facility] - Outpatient  Rehab - [facility]  Exercises - Seated Nose to Left Knee Vestibular Habituation  - 1-2 x daily - 7 x weekly - 3 sets - 3-5 reps - Seated Nose to Right Knee Vestibular Habituation  - 1 x daily - 7 x weekly - 3 sets - 10 reps - Standing Balance in Corner with Eyes Closed  - 1-2 x daily - 7 x weekly - 1 sets - 5 reps - Side Stepping with Counter Support  - 1-2 x daily - 7 x weekly - 1-2 sets - 10 reps - Standing with Head Rotation  - 1-2 x daily - 5 x weekly - 2-3 sets - 30 sec hold - Standing with Head Nod  - 1-2 x daily - 5 x weekly - 2-3 sets - 30 sec  hold   ------------------------------------------------------ Note: Objective measures were completed at Evaluation unless otherwise noted.  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 There was very mild contrast enhancement on follow up MRI with contrast. A repeat MRI brain w/wo contrast was recommended in 3 months. Repeat MRI brain on 05/26/23 showed no acute process and unchanged T2 hyperintensity in left ventral medulla with interval resolution of enhancement. MRI cervical spine on 03/09/23 showed only mild spinal stenosis.   COGNITION: Overall cognitive status: Within functional limits for tasks assessed    POSTURE:  rounded shoulders and forward head  Cervical ROM:    Active A/PROM (deg) eval  Flexion 40  Extension 28  Right lateral flexion   Left lateral flexion   Right rotation WFL  Left rotation WFL  (Blank rows = not tested)  BED MOBILITY:  Independent  TRANSFERS: Assistive device utilized: None  Sit to stand: Modified independence Stand to sit:  Modified independence  GAIT: Gait pattern: step through pattern   PATIENT SURVEYS:  FOTO 48; predicted 59  VESTIBULAR ASSESSMENT:  GENERAL OBSERVATION: Pt appears somewhat guarded in movement   SYMPTOM BEHAVIOR:  Subjective history: Had CVA in 2023, had intense bout of vertigo July 2024 (MD thought BPPV, not new stroke).  Since November, pt reports episodes of dizziness that occur any time with any type of activity/movement throughout the day; dizziness is right behind her eyes and passes briefly.  She also describes episodes of blackout over eyes when coming up to sit from lying down.  With her work in [employer], with repeated movements side to side or bending and coming up, she has almost nausea type  feeling.  She does have hx of migraines; she has DM and reports she does not drink water, only Pepsi throughout the day.  Non-Vestibular symptoms: headaches, nausea/vomiting, migraine symptoms, and pulsatile tinnitus  Type of dizziness: Imbalance (Disequilibrium), Spinning/Vertigo, and Unsteady with head/body turns  Frequency: all throughout the day  Duration: seconds  Aggravating factors: Induced by position change: rolling to the right and rolling to the left and Induced by motion: bending down to the ground, turning body quickly, turning head quickly, and sitting in a moving car  Relieving factors: head stationary and slow movements  Progression of symptoms: unchanged  OCULOMOTOR EXAM:  Ocular Alignment: normal  Ocular ROM: No Limitations  Spontaneous Nystagmus: absent  Gaze-Induced Nystagmus: absent  Smooth Pursuits: intact  Saccades: intact and slow in vertical direction (brings on nausea 3/10)-settles after several minutes  Convergence/Divergence: 2-3 cm    VESTIBULAR - OCULAR REFLEX:   Slow VOR: Normal  VOR Cancellation: Comment: reports feels everything is spinning, 2-3/10  Head-Impulse Test: HIT Right: positive HIT Left: positive (do not like this motion)  Dynamic Visual Acuity:  NA   POSITIONAL TESTING: Right Dix-Hallpike: no nystagmus and dizziness upon sitting up Left Dix-Hallpike: no nystagmus and no symptoms Right Roll Test: no nystagmus Left Roll Test: no nystagmus and saccadic eye motions as she turns from R to L, feeling mild off; upon sitting up EOM, she describes black   M-CTSIB  Condition 1: Firm Surface, EO 30 Sec, Normal Sway  Condition 2: Firm Surface, EC 30 Sec, Mild Sway  Condition 3: Foam Surface, EO 30 Sec, Mild Sway  Condition 4: Foam Surface, EC 30 Sec, Moderate Sway                                                                                                                                TREATMENT DATE: 09/03/2023    Gaze  Adaptation:  x1 Viewing Horizontal: Position: seated, Time: 30 sec, and Reps: 3 and x1 Viewing Vertical:  Position: seated, Time: 30 sec, and Reps: 3  PATIENT EDUCATION: Education details: Eval results, POC Person educated: Patient Education method: Explanation Education comprehension: verbalized understanding  HOME EXERCISE PROGRAM:  Access Code: [email or web address] URL: [email or web address] Date: 09/03/2023 Prepared by:  [facility] - Outpatient  Rehab - [facility]  Exercises - Seated Gaze Stabilization with Head Rotation  - 1 x daily - 7 x weekly - 1 sets - 3 reps - 30 sec hold - Seated Gaze Stabilization with Head Nod  - 1 x daily - 7 x weekly - 1 sets - 3 reps - 30 sec hold   GOALS: Goals reviewed with patient? Yes  SHORT TERM GOALS: Target date: 09/19/2023  Pt will be independent with HEP for improved dizziness symptoms. Baseline: Goal status: IN PROGRESS  LONG TERM GOALS: Target date: 10/17/2023  Pt will be independent with progression of HEP for improved dizziness symptoms. Baseline:  Goal status: IN PROGRESS  2.  Pt will report 0/10 dizziness with bed mobility and work tasks. Baseline: 2-3/10 Goal status: IN PROGRESS  3.  FOTO score to improve to 59 for improved overall dizziness. Baseline: 48 Goal status: IN PROGRESS  4.  Pt to improve Condition 4 on MCTSIB to mild sway or less for improved balance, vestibular system use for balance. Baseline: mod sway Goal status: IN PROGRESS   ASSESSMENT:  CLINICAL IMPRESSION: Patient arrived to session without new complaints. Habituation activities were progressed with additional movements and balance challenges. Patient reported dizziness when performing movements that require looking up and down, more challenging at quicker speeds. Able to perform challenging progression of SLS activities with good stability. Prior coordination deficits were visible with dual task gait activities- these also brought on dizziness  today. Patient tolerated session well and without complaints upon leaving.   OBJECTIVE IMPAIRMENTS: decreased balance, dizziness, and postural dysfunction.   ACTIVITY LIMITATIONS: bending, standing, bed mobility, and caring for others  PARTICIPATION LIMITATIONS: meal prep, community activity, and occupation  PERSONAL FACTORS: 3+ comorbidities: see PMH  are also affecting patient's functional outcome.   REHAB POTENTIAL: Good  CLINICAL DECISION MAKING: Stable/uncomplicated  EVALUATION COMPLEXITY: Low   PLAN:  PT FREQUENCY: 1-2x/week  PT DURATION: 6 weeks plus eval  PLANNED INTERVENTIONS: 97110-Therapeutic exercises, 97530- Therapeutic activity, 97112- Neuromuscular re-education, 97535- Self Care, [id number]- Manual therapy, [id number]- Canalith repositioning, Patient/Family education, Balance training, and Vestibular training  PLAN FOR NEXT SESSION: quick head nods/turns, dual task activities with coordination challenges       Louana Terrilyn Christians, PT, DPT 09/17/23 1:13 PM  [facility] at [facility] [address], [address] [place], [place] [address] Phone # [phone] Fax # [phone]

## 2023-09-17 ENCOUNTER — Ambulatory Visit: Payer: Managed Care, Other (non HMO) | Admitting: Physical Therapy

## 2023-09-17 ENCOUNTER — Encounter: Payer: Self-pay | Admitting: Physical Therapy

## 2023-09-17 DIAGNOSIS — R2681 Unsteadiness on feet: Secondary | ICD-10-CM

## 2023-09-17 DIAGNOSIS — R42 Dizziness and giddiness: Secondary | ICD-10-CM

## 2023-09-18 ENCOUNTER — Other Ambulatory Visit: Payer: Self-pay | Admitting: Family Medicine

## 2023-09-18 DIAGNOSIS — F5101 Primary insomnia: Secondary | ICD-10-CM

## 2023-09-19 MED ORDER — ZOLPIDEM TARTRATE 10 MG PO TABS
ORAL_TABLET | ORAL | 4 refills | Status: DC
Start: 2023-09-19 — End: 2024-05-03

## 2023-09-22 ENCOUNTER — Ambulatory Visit: Payer: Managed Care, Other (non HMO) | Admitting: Physical Therapy

## 2023-09-22 ENCOUNTER — Encounter: Payer: Self-pay | Admitting: Physical Therapy

## 2023-09-22 DIAGNOSIS — R42 Dizziness and giddiness: Secondary | ICD-10-CM | POA: Diagnosis not present

## 2023-09-22 DIAGNOSIS — R2681 Unsteadiness on feet: Secondary | ICD-10-CM

## 2023-09-22 NOTE — Therapy (Signed)
 OUTPATIENT PHYSICAL THERAPY VESTIBULAR TREATMENT NOTE     Patient Name: Nancy Cochran MRN: [id number] DOB:May 28, 1976, 48 y.o., female Today's Date: 09/22/2023  END OF SESSION:  PT End of Session - 09/22/23 0936     Visit Number 5    Number of Visits 13    Date for PT Re-Evaluation 10/17/23    Authorization Type [employer]-90 visits combined (0 used)- *Pt did have PT at [facility], not sure how many visits    PT Start Time [phone]    PT Stop Time 1018    PT Time Calculation (min) 42 min    Equipment Utilized During Treatment Gait belt    Activity Tolerance Patient tolerated treatment well    Behavior During Therapy WFL for tasks assessed/performed                 Past Medical History:  Diagnosis Date   Abnormal sensation in ear, right 07/29/2022   Cerebellar infarct (HCC) 09/03/2022   Colitis 10/18/2021   DDD (degenerative disc disease), cervical 2016   Dr. 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 10/18/2021   Past Surgical History:  Procedure Laterality Date   OTHER SURGICAL HISTORY     cyst removal shoulder   Patient Active Problem List   Diagnosis Date Noted   Sciatica of right side 06/26/2023   Allergic rhinitis 06/17/2023   Right lumbar radiculitis 06/10/2023   Raynaud's phenomenon without gangrene 03/26/2023   Elevated sed rate 03/26/2023   Left ankle swelling 03/26/2023   Disc disease, degenerative, lumbar or lumbosacral 03/26/2023   Chronic pain syndrome 03/26/2023   ANA positive 03/26/2023   Muscle pain 01/26/2023   Thrush 01/16/2023   Type 2 diabetes mellitus with other specified  complication (HCC) 11/15/2022   Diabetes mellitus due to underlying condition with unspecified complications (HCC) 10/30/2022   Paroxysmal SVT (supraventricular tachycardia) (HCC) 10/30/2022   Mixed dyslipidemia 10/30/2022   Obesity (BMI 30.0-34.9) 10/30/2022   Palpitations 10/30/2022   Cerebellar infarct (HCC) 09/03/2022   Tinnitus of both ears 09/03/2022   Skin lesion 07/29/2022   Hair loss 01/27/2022   Other fatigue 01/27/2022   Otitis externa 01/27/2022   History of cerebellar stroke 10/25/2021   Colitis 10/18/2021   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 10/18/2021   Migraines 01/03/2020   GAD (generalized anxiety disorder) 01/03/2020   DDD (degenerative disc disease), cervical 11/19/2019   Eczema 11/19/2019   Primary insomnia 11/19/2019   Pars defect 07/17/2017    PCP: Adela Holter, DO REFERRING PROVIDER: Ellene Gustin, MD   REFERRING DIAG:  [phone] (ICD-10-CM) - 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  Saturday-it was all day again with the pressure behind the eyes.  Not sure why it's different; yesterday was off and on.  Maybe it's getting better, but don't want to "jinx it"  Pt  accompanied by: self  PERTINENT HISTORY: cerebellar CVA (3/23), DM, migraines, chronic LBP, anxiety, pulsatile tinnitus ("thumping"), palatal myoclonus  PAIN:  Are you having pain? No   PRECAUTIONS: None  RED FLAGS: None   WEIGHT BEARING RESTRICTIONS: No  FALLS: Has patient fallen in last 6 months? No  LIVING ENVIRONMENT: Lives with: lives with their family Lives in: House/apartment Stairs:  one level home Has following equipment at home: None  PLOF:  Independent and Vocation/Vocational requirements: subs in school cafeteria  PATIENT GOALS: To improve the spinning sensation  OBJECTIVE:    TODAY'S TREATMENT: 09/22/2023 Activity Comments  Reviewed HEP Good return demo  Sidestep/together with head turns Sidestep x 15 ft 4 reps with added head turns Slow to start, then able to coordinate better with repetition  Forward/back step + arm swing, 5 reps x 2 Mild unsteadiness upon stopping-cues to use visual target upon stopping for stability  Side/quarter step + UE reaching activity, alternating sides to simulate cafeteria work activities + cognitive dual task Mild dizziness upon stopping, resolves within seconds   Forward/back walking 25 ft x 4 reps Mild LOB to L side, able to correct  Gait x 3:30 in gym area, no LOB Educated to add walking as part of HEP, 3-4 min, 3x/day for general activity/exercise level      PATIENT EDUCATION: Education details: Updates to HEP  Person educated: Patient Education method: Explanation Education comprehension: verbalized understanding    HOME EXERCISE PROGRAM Pt's current reported exercise routine: Treadmill 30 minutes, every other  Exercise bike 30-45 minutes, daily  Access Code: [email or web address] URL: [email or web address] Date: 09/22/2023 Prepared by: [facility] - Outpatient  Rehab - [facility]  Program Notes 3-4 minutes of walking indoors, 3x day  Exercises - Seated Nose to Left Knee Vestibular Habituation  - 1-2 x daily - 7 x weekly - 3 sets - 3-5 reps - Seated Nose to Right Knee Vestibular Habituation  - 1 x daily - 7 x weekly - 3 sets - 10 reps - Standing Balance in Corner with Eyes Closed  - 1-2 x daily - 7 x weekly - 1 sets - 5 reps - Side Stepping with Counter Support  - 1-2 x daily - 7 x weekly - 1-2 sets - 10 reps (with addition of head turns) - Standing with Head Rotation  - 1-2 x daily - 5 x weekly - 2-3 sets - 30 sec hold - Standing with Head Nod  - 1-2 x daily - 5 x  weekly - 2-3 sets - 30 sec hold   ------------------------------------------------------ Note: Objective measures were completed at Evaluation unless otherwise noted.  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 There was very mild contrast enhancement on follow up MRI with contrast. A repeat MRI brain w/wo contrast was recommended in 3 months. Repeat MRI brain on 05/26/23 showed no acute process and unchanged T2 hyperintensity in left ventral medulla with interval resolution of enhancement. MRI cervical spine on 03/09/23 showed only mild spinal stenosis.   COGNITION: Overall cognitive status: Within functional limits for tasks assessed    POSTURE:  rounded shoulders and forward head  Cervical ROM:    Active A/PROM (deg) eval  Flexion 40  Extension 28  Right lateral flexion   Left lateral flexion   Right rotation WFL  Left rotation WFL  (Blank rows = not  tested)  BED MOBILITY:  Independent  TRANSFERS: Assistive device utilized: None  Sit to stand: Modified independence Stand to sit: Modified independence  GAIT: Gait pattern: step through pattern   PATIENT SURVEYS:  FOTO 48; predicted 59  VESTIBULAR ASSESSMENT:  GENERAL OBSERVATION: Pt appears somewhat guarded in movement   SYMPTOM BEHAVIOR:  Subjective history: Had CVA in 2023, had intense bout of vertigo July 2024 (MD thought BPPV, not new stroke).  Since November, pt reports episodes of dizziness that occur any time with any type of activity/movement throughout the day; dizziness is right behind her eyes and passes briefly.  She also describes episodes of "blackout over eyes" when coming up to sit from lying down.  With her work in [employer], with repeated movements side to side or bending and coming up, she  has almost nausea type feeling.  She does have hx of migraines; she has DM and reports she does not drink water, only Pepsi throughout the day.  Non-Vestibular symptoms: headaches, nausea/vomiting, migraine symptoms, and pulsatile tinnitus  Type of dizziness: Imbalance (Disequilibrium), Spinning/Vertigo, and Unsteady with head/body turns  Frequency: all throughout the day  Duration: seconds  Aggravating factors: Induced by position change: rolling to the right and rolling to the left and Induced by motion: bending down to the ground, turning body quickly, turning head quickly, and sitting in a moving car  Relieving factors: head stationary and slow movements  Progression of symptoms: unchanged  OCULOMOTOR EXAM:  Ocular Alignment: normal  Ocular ROM: No Limitations  Spontaneous Nystagmus: absent  Gaze-Induced Nystagmus: absent  Smooth Pursuits: intact  Saccades: intact and slow in vertical direction (brings on nausea 3/10)-settles after several minutes  Convergence/Divergence: 2-3 cm    VESTIBULAR - OCULAR REFLEX:   Slow VOR: Normal  VOR Cancellation: Comment: reports feels everything is spinning, 2-3/10  Head-Impulse Test: HIT Right: positive HIT Left: positive ("do not like this motion")  Dynamic Visual Acuity:  NA   POSITIONAL TESTING: Right Dix-Hallpike: no nystagmus and dizziness upon sitting up Left Dix-Hallpike: no nystagmus and no symptoms Right Roll Test: no nystagmus Left Roll Test: no nystagmus and saccadic eye motions as she turns from R to L, feeling mild "off"; upon sitting up EOM, she describes "black"   M-CTSIB  Condition 1: Firm Surface, EO 30 Sec, Normal Sway  Condition 2: Firm Surface, EC 30 Sec, Mild Sway  Condition 3: Foam Surface, EO 30 Sec, Mild Sway  Condition 4: Foam Surface, EC 30 Sec, Moderate Sway                                                                                                                                TREATMENT DATE:  09/03/2023    Gaze Adaptation:  x1 Viewing Horizontal: Position: seated, Time: 30 sec, and Reps: 3 and x1 Viewing Vertical:  Position: seated, Time: 30 sec, and Reps: 3  PATIENT EDUCATION: Education details: Eval results, POC Person educated: Patient  Education method: Explanation Education comprehension: verbalized understanding  HOME EXERCISE PROGRAM:  Access Code: [email or web address] URL: [email or web address] Date: 09/03/2023 Prepared by: [facility] - Outpatient  Rehab - [facility]  Exercises - Seated Gaze Stabilization with Head Rotation  - 1 x daily - 7 x weekly - 1 sets - 3 reps - 30 sec hold - Seated Gaze Stabilization with Head Nod  - 1 x daily - 7 x weekly - 1 sets - 3 reps - 30 sec hold   GOALS: Goals reviewed with patient? Yes  SHORT TERM GOALS: Target date: 09/19/2023  Pt will be independent with HEP for improved dizziness symptoms. Baseline: Goal status: MET2/05/2024  LONG TERM GOALS: Target date: 10/17/2023  Pt will be independent with progression of HEP for improved dizziness symptoms. Baseline:  Goal status: IN PROGRESS  2.  Pt will report 0/10 dizziness with bed mobility and work tasks. Baseline: 2-3/10 Goal status: IN PROGRESS  3.  FOTO score to improve to 59 for improved overall dizziness. Baseline: 48 Goal status: IN PROGRESS  4.  Pt to improve Condition 4 on MCTSIB to mild sway or less for improved balance, vestibular system use for balance. Baseline: mod sway Goal status: IN PROGRESS   ASSESSMENT:  CLINICAL IMPRESSION: Pt presents today with reports of no symptoms on Friday, but increased symptoms on Saturday. Skilled PT session focused on continuing to educate patient on symptoms in relation to activities and exercises; reviewed and updated HEP.  Pt has met STG 1 for HEP. Worked on additional challenges of whole body movements/turns/alternating sides and increased dual tasking, with dizziness brought on at the end of movement activities,  which resolves in seconds.  She is progressing well and able to tolerated more activities than at eval.   Pt will continue to benefit from skilled PT towards goals for improved functional mobility and reduced dizziness.  OBJECTIVE IMPAIRMENTS: decreased balance, dizziness, and postural dysfunction.   ACTIVITY LIMITATIONS: bending, standing, bed mobility, and caring for others  PARTICIPATION LIMITATIONS: meal prep, community activity, and occupation  PERSONAL FACTORS: 3+ comorbidities: see PMH  are also affecting patient's functional outcome.   REHAB POTENTIAL: Good  CLINICAL DECISION MAKING: Stable/uncomplicated  EVALUATION COMPLEXITY: Low   PLAN:  PT FREQUENCY: 1-2x/week  PT DURATION: 6 weeks plus eval  PLANNED INTERVENTIONS: 97110-Therapeutic exercises, 97530- Therapeutic activity, 97112- Neuromuscular re-education, 97535- Self Care, 21308- Manual therapy, [phone]- Canalith repositioning, Patient/Family education, Balance training, and Vestibular training  PLAN FOR NEXT SESSION: Continue quick head nods/turns, dual task activities with coordination challenges       Dessie Flow, PT 09/22/23 10:42 AM Phone: [phone] Fax: [phone]  [facility] at [facility] [address], [address] [place], [place] [address] Phone # [phone] Fax # [phone]

## 2023-09-23 ENCOUNTER — Other Ambulatory Visit: Payer: Self-pay | Admitting: Family Medicine

## 2023-09-24 ENCOUNTER — Ambulatory Visit: Payer: Managed Care, Other (non HMO)

## 2023-09-25 NOTE — Therapy (Signed)
OUTPATIENT PHYSICAL THERAPY VESTIBULAR TREATMENT NOTE     Patient Name: Nancy Cochran MRN: [id number] DOB:05-Jun-1976, 48 y.o., female Today's Date: 09/29/2023  END OF SESSION:  PT End of Session - 09/29/23 1057     Visit Number 6    Number of Visits 13    Date for PT Re-Evaluation 10/17/23    Authorization Type [employer]-90 visits combined (0 used)- *Pt did have PT at [facility], not sure how many visits    PT Start Time 1020    PT Stop Time 1058    PT Time Calculation (min) 38 min    Equipment Utilized During Treatment Gait belt    Activity Tolerance Patient tolerated treatment well    Behavior During Therapy WFL for tasks assessed/performed                  Past Medical History:  Diagnosis Date   Abnormal sensation in ear, right 07/29/2022   Cerebellar infarct (HCC) 09/03/2022   Colitis 10/18/2021   DDD (degenerative disc disease), cervical 2016   Dr. 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   Past Surgical History:  Procedure Laterality Date   OTHER SURGICAL HISTORY     cyst removal shoulder   Patient Active Problem List   Diagnosis Date Noted   Other deformities of toe(s) (acquired), unspecified foot 09/26/2023   Sciatica of right side 06/26/2023   Allergic rhinitis 06/17/2023   Right lumbar radiculitis 06/10/2023   Raynaud's phenomenon without gangrene 03/26/2023   Elevated sed rate 03/26/2023   Left ankle swelling 03/26/2023   Disc disease, degenerative, lumbar or lumbosacral 03/26/2023   Chronic pain syndrome 03/26/2023   ANA positive 03/26/2023   Muscle pain 01/26/2023    Thrush 01/16/2023   Type 2 diabetes mellitus with other specified complication (HCC) 11/15/2022   Diabetes mellitus due to underlying condition with unspecified complications (HCC) 10/30/2022   Paroxysmal SVT (supraventricular tachycardia) (HCC) 10/30/2022   Mixed dyslipidemia 10/30/2022   Obesity (BMI 30.0-34.9) 10/30/2022   Palpitations 10/30/2022   Cerebellar infarct (HCC) 09/03/2022   Tinnitus of both ears 09/03/2022   Skin lesion 07/29/2022   Hair loss 01/27/2022   Other fatigue 01/27/2022   Otitis externa 01/27/2022   History of cerebellar stroke 10/25/2021   Colitis 10/18/2021   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 10/18/2021   Migraines 01/03/2020   GAD (generalized anxiety disorder) 01/03/2020   DDD (degenerative disc disease), cervical 11/19/2019   Eczema 11/19/2019   Primary insomnia 11/19/2019   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 Wondering if the dizziness is linked to the meds.   Pt accompanied by:  self, spouse   PERTINENT HISTORY: cerebellar CVA (3/23), DM, migraines, chronic LBP, anxiety, pulsatile tinnitus ("thumping"), palatal myoclonus  PAIN:  Are you having pain? No   PRECAUTIONS: None  RED FLAGS: None   WEIGHT BEARING RESTRICTIONS: No  FALLS: Has patient fallen in last 6 months? No  LIVING ENVIRONMENT: Lives with: lives with their family Lives in: House/apartment Stairs:  one level home Has following equipment at home:  None  PLOF: Independent and Vocation/Vocational requirements: subs in school cafeteria  PATIENT GOALS: To improve the spinning sensation  OBJECTIVE:     TODAY'S TREATMENT: 09/29/23 Activity Comments  1/2 turns to targets + alt toe taps on cone CGA; reports mild dizziness and instability   gait + head turns/nods  Mild instability; c/o mild dizziness   romberg EC + head turns/nods 2x30" each  Mild-mod sway; no dizziness; slightly more challenging with head nods   bending to place cone on floor, forward/side step over it  Good stability, some cues to avoid circumduction. Pt reports "black wave" with bending but recalls hx of normal orthostatics    PATIENT EDUCATION: Education details: edu on vestibular migraines as possibility for her symptoms and provided handout on this condition; advised to speak to her Neurologist about her medication concerns, encouraged to schedule out according to POC Person educated: Patient and Spouse Education method: Explanation, Demonstration, Tactile cues, Verbal cues, and Handouts Education comprehension: verbalized understanding and returned demonstration    HOME EXERCISE PROGRAM Pt's current reported exercise routine: Treadmill 30 minutes, every other  Exercise bike 30-45 minutes, daily  Access Code: [email or web address] URL: [email or web address] Date: 09/22/2023 Prepared by: [facility] - Outpatient  Rehab - [facility]  Program Notes 3-4 minutes of walking indoors, 3x day  Exercises - Seated Nose to Left Knee Vestibular Habituation  - 1-2 x daily - 7 x weekly - 3 sets - 3-5 reps - Seated Nose to Right Knee Vestibular Habituation  - 1 x daily - 7 x weekly - 3 sets - 10 reps - Standing Balance in Corner with Eyes Closed  - 1-2 x daily - 7 x weekly - 1 sets - 5 reps - Side Stepping with Counter Support  - 1-2 x daily - 7 x weekly - 1-2 sets - 10 reps (with addition of head turns) - Standing with Head Rotation  - 1-2 x daily - 5 x weekly -  2-3 sets - 30 sec hold - Standing with Head Nod  - 1-2 x daily - 5 x weekly - 2-3 sets - 30 sec hold   ------------------------------------------------------ Note: Objective measures were completed at Evaluation unless otherwise noted.  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 There was very mild contrast enhancement on follow up MRI with contrast. A repeat MRI brain w/wo contrast was recommended in 3 months. Repeat MRI brain on 05/26/23 showed no acute process and unchanged T2 hyperintensity in left ventral medulla with interval resolution of enhancement. MRI cervical spine on 03/09/23 showed only mild spinal stenosis.   COGNITION: Overall cognitive status: Within functional limits for tasks assessed    POSTURE:  rounded shoulders and forward head  Cervical ROM:    Active A/PROM (deg) eval  Flexion 40  Extension 28  Right lateral flexion   Left lateral flexion   Right rotation WFL  Left rotation WFL  (Blank rows =  not tested)  BED MOBILITY:  Independent  TRANSFERS: Assistive device utilized: None  Sit to stand: Modified independence Stand to sit: Modified independence  GAIT: Gait pattern: step through pattern   PATIENT SURVEYS:  FOTO 48; predicted 59  VESTIBULAR ASSESSMENT:  GENERAL OBSERVATION: Pt appears somewhat guarded in movement   SYMPTOM BEHAVIOR:  Subjective history: Had CVA in 2023, had intense bout of vertigo July 2024 (MD thought BPPV, not new stroke).  Since November, pt reports episodes of dizziness that occur any time with any type of activity/movement throughout the day; dizziness is right behind her eyes and passes briefly.  She also describes episodes of "blackout over eyes" when coming up to sit from lying down.  With her work in [employer],  with repeated movements side to side or bending and coming up, she has almost nausea type feeling.  She does have hx of migraines; she has DM and reports she does not drink water, only Pepsi throughout the day.  Non-Vestibular symptoms: headaches, nausea/vomiting, migraine symptoms, and pulsatile tinnitus  Type of dizziness: Imbalance (Disequilibrium), Spinning/Vertigo, and Unsteady with head/body turns  Frequency: all throughout the day  Duration: seconds  Aggravating factors: Induced by position change: rolling to the right and rolling to the left and Induced by motion: bending down to the ground, turning body quickly, turning head quickly, and sitting in a moving car  Relieving factors: head stationary and slow movements  Progression of symptoms: unchanged  OCULOMOTOR EXAM:  Ocular Alignment: normal  Ocular ROM: No Limitations  Spontaneous Nystagmus: absent  Gaze-Induced Nystagmus: absent  Smooth Pursuits: intact  Saccades: intact and slow in vertical direction (brings on nausea 3/10)-settles after several minutes  Convergence/Divergence: 2-3 cm    VESTIBULAR - OCULAR REFLEX:   Slow VOR: Normal  VOR Cancellation: Comment: reports feels everything is spinning, 2-3/10  Head-Impulse Test: HIT Right: positive HIT Left: positive ("do not like this motion")  Dynamic Visual Acuity:  NA   POSITIONAL TESTING: Right Dix-Hallpike: no nystagmus and dizziness upon sitting up Left Dix-Hallpike: no nystagmus and no symptoms Right Roll Test: no nystagmus Left Roll Test: no nystagmus and saccadic eye motions as she turns from R to L, feeling mild "off"; upon sitting up EOM, she describes "black"   M-CTSIB  Condition 1: Firm Surface, EO 30 Sec, Normal Sway  Condition 2: Firm Surface, EC 30 Sec, Mild Sway  Condition 3: Foam Surface, EO 30 Sec, Mild Sway  Condition 4: Foam Surface, EC 30 Sec, Moderate Sway                                                                                                                                 TREATMENT DATE: 09/03/2023    Gaze Adaptation:  x1 Viewing Horizontal: Position: seated, Time: 30 sec, and Reps: 3 and x1 Viewing Vertical:  Position: seated, Time: 30 sec, and Reps: 3  PATIENT EDUCATION: Education details: Eval results, POC Person educated:  Patient Education method: Explanation Education comprehension: verbalized understanding  HOME EXERCISE PROGRAM:  Access Code: [email or web address] URL: [email or web address] Date: 09/03/2023 Prepared by: [facility] - Outpatient  Rehab - [facility]  Exercises - Seated Gaze Stabilization with Head Rotation  - 1 x daily - 7 x weekly - 1 sets - 3 reps - 30 sec hold - Seated Gaze Stabilization with Head Nod  - 1 x daily - 7 x weekly - 1 sets - 3 reps - 30 sec hold   GOALS: Goals reviewed with patient? Yes  SHORT TERM GOALS: Target date: 09/19/2023  Pt will be independent with HEP for improved dizziness symptoms. Baseline: Goal status: MET2/05/2024  LONG TERM GOALS: Target date: 10/17/2023  Pt will be independent with progression of HEP for improved dizziness symptoms. Baseline:  Goal status: IN PROGRESS  2.  Pt will report 0/10 dizziness with bed mobility and work tasks. Baseline: 2-3/10 Goal status: IN PROGRESS  3.  FOTO score to improve to 59 for improved overall dizziness. Baseline: 48 Goal status: IN PROGRESS  4.  Pt to improve Condition 4 on MCTSIB to mild sway or less for improved balance, vestibular system use for balance. Baseline: mod sway Goal status: IN PROGRESS   ASSESSMENT:  CLINICAL IMPRESSION: Patient arrived to session with some concerns about her medications causing dizziness. Reports that she has messaged her MD with this concern. Proceeded with balance and habituation activities. Patient appeared to have improved stability with turns and additional balance challenges with head turns and nods. Patient continues to demonstrate slightly more instability with  head nods vs. turns. Patient appeared steady with bending tasks but reports hx of lightheadedness/pre-syncopal symptoms. Orthostatics taken on 09/10/23  were normal. Patient reported no symptoms at end of session.   OBJECTIVE IMPAIRMENTS: decreased balance, dizziness, and postural dysfunction.   ACTIVITY LIMITATIONS: bending, standing, bed mobility, and caring for others  PARTICIPATION LIMITATIONS: meal prep, community activity, and occupation  PERSONAL FACTORS: 3+ comorbidities: see PMH  are also affecting patient's functional outcome.   REHAB POTENTIAL: Good  CLINICAL DECISION MAKING: Stable/uncomplicated  EVALUATION COMPLEXITY: Low   PLAN:  PT FREQUENCY: 1-2x/week  PT DURATION: 6 weeks plus eval  PLANNED INTERVENTIONS: 97110-Therapeutic exercises, 97530- Therapeutic activity, 97112- Neuromuscular re-education, 97535- Self Care, 16109- Manual therapy, [phone]- Canalith repositioning, Patient/Family education, Balance training, and Vestibular training  PLAN FOR NEXT SESSION: Continue quick head nods/turns, dual task activities with coordination challenges    Baldemar Friday, PT, DPT 09/29/23 10:59 AM  [facility] [address], [address] [place], [place] [address] Phone # [phone] Fax # [phone]

## 2023-09-26 ENCOUNTER — Ambulatory Visit: Payer: Managed Care, Other (non HMO) | Admitting: Family Medicine

## 2023-09-26 ENCOUNTER — Encounter: Payer: Self-pay | Admitting: Neurology

## 2023-09-26 ENCOUNTER — Encounter: Payer: Self-pay | Admitting: Family Medicine

## 2023-09-26 VITALS — BP 122/82 | HR 95 | Ht 63.0 in | Wt 168.0 lb

## 2023-09-26 DIAGNOSIS — R42 Dizziness and giddiness: Secondary | ICD-10-CM

## 2023-09-26 DIAGNOSIS — E1169 Type 2 diabetes mellitus with other specified complication: Secondary | ICD-10-CM

## 2023-09-26 DIAGNOSIS — E782 Mixed hyperlipidemia: Secondary | ICD-10-CM

## 2023-09-26 DIAGNOSIS — I6789 Other cerebrovascular disease: Secondary | ICD-10-CM

## 2023-09-26 DIAGNOSIS — M205X9 Other deformities of toe(s) (acquired), unspecified foot: Secondary | ICD-10-CM | POA: Insufficient documentation

## 2023-09-26 LAB — POCT GLYCOSYLATED HEMOGLOBIN (HGB A1C): HbA1c, POC (controlled diabetic range): 6.3 % (ref 0.0–7.0)

## 2023-09-26 NOTE — Progress Notes (Signed)
Nancy Cochran - 48 y.o. female MRN [id number]  Date of birth: 19-Jun-1976  Subjective Chief Complaint  Patient presents with   Medical Management of Chronic Issues    HPI Nancy Cochran is a 48 y.o. female here today for follow up visit.   She is managing blood sugar with dietary changes.  Didn't tolerate metformin or Rybelsus well.  A1c today is 6.3%.    She has been dealing with some vertigo.  Seen by neurology previously and referred to PT for vestibular therapy.  She reports that this is somewhat helpful.   Possibility that this may be linked to her migraines. Neurology is considering topiramate.   Remains on asa and crestor due to history of CVA.    Seeing digestive health for GI issues with spasm and cramping.  Miralax added for tx of constipation.  She is seeing [facility] for second opinion.   ROS:  A comprehensive ROS was completed and negative except as noted per HPI  Allergies  Allergen Reactions   Gabapentin    Lyrica [Pregabalin]     Past Medical History:  Diagnosis Date   Abnormal sensation in ear, right 07/29/2022   Cerebellar infarct (HCC) 09/03/2022   Colitis 10/18/2021   DDD (degenerative disc disease), cervical 2016   Dr. 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    Past Surgical History:  Procedure Laterality Date   OTHER SURGICAL HISTORY     cyst removal shoulder    Social History   Socioeconomic History   Marital status: Married    Spouse name: Not on file   Number of children: 2   Years of education: Not on file   Highest education level: Not on file  Occupational  History   Occupation: Lawyer  Tobacco Use   Smoking status: Former    Current packs/day: 0.00    Average packs/day: 1 pack/day for 28.0 years (28.0 ttl pk-yrs)    Types: Cigarettes    Quit date: 09/12/2020    Years since quitting: 3.0    Passive exposure: Never   Smokeless tobacco: Never  Vaping Use   Vaping status: Never Used  Substance and Sexual Activity   Alcohol use: No   Drug use: No   Sexual activity: Yes    Partners: Male    Birth control/protection: Injection  Other Topics Concern   Not on file  Social History Narrative   Are you right handed or left handed? Right handed   Are you currently employed ? No       Do you live at home alone? Live with husband and Daughter      What type of home do you live in: 1 story or 2 story? One story   Caffeine 3-4 sodas a day       Social Drivers of Corporate investment banker Strain: Not on file  Food Insecurity: Not on file  Transportation Needs: Not on file  Physical Activity: Not on file  Stress: Not on file  Social Connections: Unknown (10/15/2022)   Received from [facility], [facility]  Social Network    Social Network: Not on file    Family History  Problem Relation Age of Onset   Heart disease Father    Hypertension Maternal Grandmother    Heart attack Maternal Grandmother    Hypertension Maternal Grandfather    Heart attack Maternal Grandfather    Hypertension Paternal Grandmother    Heart attack Paternal Grandmother    Stroke Paternal Grandmother    Hypertension Paternal Grandfather    Heart attack Paternal Grandfather    Stroke Maternal Aunt    Diabetes Maternal Uncle    Breast cancer Neg Hx        Mothers family   Cancer Neg Hx        Fathers family    Health Maintenance  Topic Date Due   DTaP/Tdap/Td (1 - Tdap) Never done   Cervical Cancer Screening (HPV/Pap Cotest)  07/29/2022   Colonoscopy  Never done   OPHTHALMOLOGY EXAM  09/17/2023   INFLUENZA VACCINE  11/10/2023  (Originally 03/13/2023)   Hepatitis C Screening  03/25/2024 (Originally 06/26/1996)   Pneumococcal Vaccine 25-25 Years old (1 of 2 - PCV) 09/25/2024 (Originally 06/26/1984)   MAMMOGRAM  10/26/2023   Diabetic kidney evaluation - eGFR measurement  02/26/2024   HEMOGLOBIN A1C  03/25/2024   FOOT EXAM  04/17/2024   Diabetic kidney evaluation - Urine ACR  06/25/2024   HIV Screening  Completed   HPV VACCINES  Aged Out   COVID-19 Vaccine  Discontinued     ----------------------------------------------------------------------------------------------------------------------------------------------------------------------------------------------------------------- Physical Exam BP 122/82 (Cuff Size: Normal)   Pulse 95   Ht 5\' 3"  (1.6 m)   Wt 168 lb (76.2 kg)   SpO2 100%   BMI 29.76 kg/m   Physical Exam Constitutional:      Appearance: Normal appearance.  Eyes:     General: No scleral icterus. Cardiovascular:     Rate and Rhythm: Normal rate and regular rhythm.  Pulmonary:     Effort: Pulmonary effort is normal.     Breath sounds: Normal breath sounds.  Musculoskeletal:     Cervical back: Neck supple.  Neurological:     Mental Status: She is alert.  Psychiatric:        Mood and Affect: Mood normal.        Behavior: Behavior normal.     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    Type 2 diabetes mellitus with other specified complication (HCC) Diabetes is well controlled without medication at this time.  Continue with dietary changes.   Mixed dyslipidemia Continue atorvastatin at current strength.    No orders of the defined types were placed in this encounter.   Return in about 6 months (around 03/25/2024)  for Type 2 Diabetes.    This visit occurred during the SARS-CoV-2 public health emergency.  Safety protocols were in place, including screening questions prior to the visit, additional usage of staff PPE, and extensive cleaning of exam room while observing appropriate contact time as indicated for disinfecting solutions.

## 2023-09-26 NOTE — Assessment & Plan Note (Signed)
Diabetes is well controlled without medication at this time.  Continue with dietary changes.

## 2023-09-26 NOTE — Assessment & Plan Note (Signed)
She is seeing neurology and PT.

## 2023-09-26 NOTE — Assessment & Plan Note (Signed)
Continue atorvastatin at current strength.

## 2023-09-28 ENCOUNTER — Encounter: Payer: Self-pay | Admitting: Family Medicine

## 2023-09-29 ENCOUNTER — Ambulatory Visit: Payer: Managed Care, Other (non HMO) | Admitting: Physical Therapy

## 2023-09-29 ENCOUNTER — Other Ambulatory Visit: Payer: Self-pay | Admitting: Neurology

## 2023-09-29 ENCOUNTER — Encounter: Payer: Self-pay | Admitting: Physical Therapy

## 2023-09-29 DIAGNOSIS — R42 Dizziness and giddiness: Secondary | ICD-10-CM

## 2023-09-29 DIAGNOSIS — G43009 Migraine without aura, not intractable, without status migrainosus: Secondary | ICD-10-CM

## 2023-09-29 DIAGNOSIS — R2681 Unsteadiness on feet: Secondary | ICD-10-CM

## 2023-09-29 MED ORDER — TOPIRAMATE 25 MG PO TABS: 25.0000 mg | ORAL_TABLET | Freq: Every day | ORAL | 5 refills | Status: DC

## 2023-10-01 ENCOUNTER — Ambulatory Visit: Payer: Managed Care, Other (non HMO) | Admitting: Physical Therapy

## 2023-10-09 ENCOUNTER — Encounter: Payer: Self-pay | Admitting: Neurology

## 2023-10-13 ENCOUNTER — Other Ambulatory Visit: Payer: Self-pay | Admitting: Neurology

## 2023-10-13 DIAGNOSIS — G43009 Migraine without aura, not intractable, without status migrainosus: Secondary | ICD-10-CM

## 2023-10-13 MED ORDER — AMITRIPTYLINE HCL 25 MG PO TABS
25.0000 mg | ORAL_TABLET | Freq: Every day | ORAL | 3 refills | Status: DC
Start: 2023-10-13 — End: 2023-12-02

## 2023-10-21 ENCOUNTER — Encounter: Payer: Self-pay | Admitting: Sports Medicine

## 2023-10-21 ENCOUNTER — Ambulatory Visit (INDEPENDENT_AMBULATORY_CARE_PROVIDER_SITE_OTHER): Admitting: Sports Medicine

## 2023-10-21 ENCOUNTER — Encounter (INDEPENDENT_AMBULATORY_CARE_PROVIDER_SITE_OTHER): Payer: Self-pay | Admitting: Sports Medicine

## 2023-10-21 DIAGNOSIS — M5416 Radiculopathy, lumbar region: Secondary | ICD-10-CM | POA: Diagnosis not present

## 2023-10-21 DIAGNOSIS — R9389 Abnormal findings on diagnostic imaging of other specified body structures: Secondary | ICD-10-CM | POA: Diagnosis not present

## 2023-10-21 DIAGNOSIS — M5030 Other cervical disc degeneration, unspecified cervical region: Secondary | ICD-10-CM

## 2023-10-21 DIAGNOSIS — M9262 Juvenile osteochondrosis of tarsus, left ankle: Secondary | ICD-10-CM | POA: Diagnosis not present

## 2023-10-21 NOTE — Assessment & Plan Note (Signed)
 This is a pleasant 48 year old female, she has known cervical DDD, she had right-sided neck pain with periscapular radiation, she got some Decadron, she did not tolerate prednisone, this did not help, she got a cervical epidural in the fall of last year, this also did not help, she was unable to tolerate gabapentin or Lyrica. She had formal PT, no improvement, Lyrica at the current dosing does not help but she is getting some difficulty with her near vision so we will not go up on the dose. Unfortunately after failure of all of the above she is a surgical candidate, she is not interested so we will watch this for now.

## 2023-10-21 NOTE — Progress Notes (Signed)
    Procedures performed today:    None.  Independent interpretation of notes and tests performed by another provider:   None.  Brief History, Exam, Impression, and Recommendations:    DDD (degenerative disc disease), cervical This is a pleasant 48 year old female, she has known cervical DDD, she had right-sided neck pain with periscapular radiation, she got some Decadron, she did not tolerate prednisone, this did not help, she got a cervical epidural in the fall of last year, this also did not help, she was unable to tolerate gabapentin or Lyrica. She had formal PT, no improvement, Lyrica at the current dosing does not help but she is getting some difficulty with her near vision so we will not go up on the dose. Unfortunately after failure of all of the above she is a surgical candidate, she is not interested so we will watch this for now.  Right lumbar radiculitis Chelbi is also having right-sided low back pain with radiation down the right leg to the lateral 3 toes consistent with an L5 distribution, she has had physical therapy, steroids, naproxen, she did not tolerate neuropathics, amitriptyline not effective. For this reason we will go and proceed with MRI in anticipation of a right L5-S1 transforaminal epidural. She would like to follow-up with me first before proceeding with the injection.  Haglund's deformity, left Chronic insertional heel pain on the left, visible Haglund's deformity with tenderness to palpation, negative Thompson's test Discussed the importance of eccentric rehab, heel lifts, she will do this for about 6 weeks. She tells me she did get some x-rays in outside facility and she will bring the disc.    ____________________________________________ Nancy Cochran. Benjamin Stain, M.D., ABFM., CAQSM., AME. Primary Care and Sports Medicine [facility]  Adjunct Professor of Family Medicine  [employer] [facility] [facility] [employer]  [place]

## 2023-10-21 NOTE — Assessment & Plan Note (Signed)
 Chronic insertional heel pain on the left, visible Haglund's deformity with tenderness to palpation, negative Thompson's test Discussed the importance of eccentric rehab, heel lifts, she will do this for about 6 weeks. She tells me she did get some x-rays in outside facility and she will bring the disc.

## 2023-10-21 NOTE — Assessment & Plan Note (Signed)
 Nancy Cochran is also having right-sided low back pain with radiation down the right leg to the lateral 3 toes consistent with an L5 distribution, she has had physical therapy, steroids, naproxen, she did not tolerate neuropathics, amitriptyline not effective. For this reason we will go and proceed with MRI in anticipation of a right L5-S1 transforaminal epidural. She would like to follow-up with me first before proceeding with the injection.

## 2023-10-22 NOTE — Telephone Encounter (Signed)

## 2023-10-27 ENCOUNTER — Telehealth: Payer: Self-pay | Admitting: Family Medicine

## 2023-10-27 NOTE — Telephone Encounter (Signed)
 Pt dropped off images for provider. Pt stated she was unable to get images on a disc due to equipment malfunction.

## 2023-11-05 LAB — HM MAMMOGRAPHY

## 2023-11-08 ENCOUNTER — Ambulatory Visit

## 2023-11-08 DIAGNOSIS — M5416 Radiculopathy, lumbar region: Secondary | ICD-10-CM

## 2023-11-12 ENCOUNTER — Encounter: Payer: Self-pay | Admitting: Sports Medicine

## 2023-11-19 ENCOUNTER — Telehealth: Payer: Self-pay | Admitting: *Deleted

## 2023-11-19 NOTE — Telephone Encounter (Signed)
 Copied from CRM [id number]. Topic: Clinical - Lab/Test Results >> Nov 19, 2023  9:10 AM Irine Seal wrote: Reason for CRM: patient calling in regards to MR LUMBAR SPINE WO CONTRAST (Accession [id number]) (Order [id number]) 11/08/2023 -- patient stated "usually  she has heard something by now" advised patient that there was a back log, and as soon as the radiologist finishes reviewing the imaging and releases the information she would get a callback with those results patient verbalized understanding and stated " she keep checking MyChart in the meantime"

## 2023-11-19 NOTE — Telephone Encounter (Signed)
 Results of MRI lumbar are still not back. Did call radiology dept to have this changed to STAT so we can be able to get results back sooner. Mychart sent to pt letting her know an update.

## 2023-11-25 ENCOUNTER — Telehealth: Payer: Self-pay

## 2023-11-25 NOTE — Telephone Encounter (Signed)
 Copied from CRM [id number]. Topic: Clinical - Lab/Test Results >> Nov 19, 2023  9:10 AM Jayson Michael wrote: Reason for CRM: patient calling in regards to MR LUMBAR SPINE WO CONTRAST (Accession [id number]) (Order [id number]) 11/08/2023 -- patient stated "usually  she has heard something by now" advised patient that there was a back log, and as soon as the radiologist finishes reviewing the imaging and releases the information she would get a callback with those results patient verbalized understanding and stated " she keep checking MyChart in the meantime" >> Nov 24, 2023  4:12 PM Jayson Michael wrote: Patient returning missed call  regarding lab results. Advised I could not provide imaging results. Patient is requesting a call back. Did not transfer due to difficulty hearing the patient while they were in the vehicle. Advised her that Burdette Carolin would call back as soon as she is able to, and informed her that Burdette Carolin was assisting other patients at the moment.  [phone] (M)

## 2023-11-25 NOTE — Telephone Encounter (Signed)
 Patient informed of imaging results and scheduled appt with Dr. Sandy Crumb for 12/03/23 to discuss these results and treatment options.

## 2023-11-26 ENCOUNTER — Encounter: Payer: Self-pay | Admitting: Neurology

## 2023-11-27 NOTE — Progress Notes (Signed)
 NEUROLOGY FOLLOW UP OFFICE NOTE  Nancy Cochran [id number]  Subjective:  Nancy Cochran is a 48 y.o. 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  To briefly review: Initial consultation (06/12/22): Patient presented to the ED on 10/18/21 for vertigo, nausea, and vomiting. Patient was found to have colitis by CT abdomen and pelvis, but work up for stroke was also started. 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 She had dysarthria and right sided weakness (arm and leg) as well. Patient is not sure if this was present initially, but it was definitely noted when she returned home.    She had hypercoagulable work-up including factor V Leiden, prothrombin gene mutation, cardiolipin antibodies, beta-2 glycoprotein antibodies, lupus anticoagulant panel which were all unremarkable.  SPEP unremarkable.  No evidence of elevated ANCA titers or complement levels.  Protein S total, activity, protein C total, activity is also unremarkable.  No evidence of hypothyroidism. Cholesterol was elevated, so statin was started. She was also started on asa 81 mg. HbA1c was 5.2, TSH 0.845. Telemetry showed no arrhythmias. Echo showed no significant abnormalities. Zio patch was ordered for outpatient. Patient improved and was discharged with outpatient PT. She has not done speech therapy (was referred but could not find someone that would take her).   Patient was initially seen at [facility] in 12/2021 for post stroke care. Per their documentation, patient had aphasia and right sided weakness. Their documentation implicates depo birth control shot and smoking as likely causes of her stroke. Per patient, [facility] told her to take 2 asa 81mg  daily, so she has been. She also stopped the Crestor  10 mg due to price increase  (patient does not have insurance). Patient is still taking the depo birth control shot. She quit smoking after her stroke.   Current symptoms: -Has mild dysarthria -Thumping in right ear, which she has seen ENT and PCP without cause. It has improved but still there. -Right sided weakness, improving but still weak -Occasional dizziness, especially with quick movements -Muscle spasms on right face, arm, and stomach -Feels like her heart races more often -Takes amitriptyline  for migraines (having 4-5 per month); takes tylenol  for abortive but does not help much   12/11/21: Patient messaged on 06/15/22 about thumping in ear worsening, wondering if Crestor  may have contributed. 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 While cerebellar lesions can cause this, her symptoms predate her stroke, with thumping starting in 07/2021 and stroke in 10/2021. She was given clonazepam by ENT. She tried this for a week, but it did not help. She was recommended botox by ENT, but did not want to pursue this. I also recommended sensory tricks (touching the roof of her mouthing, opening mouth wide, or valsava) to see if this helped.   Per my telephone note on 06/24/22: "She also mentioned a friend with ear thumping and a facial nerve compression. I mentioned that I did not see evidence of a facial nerve problem on exam, but that another MRI/MRA could evaluate. Patient had these since symptom onset though (at time of stroke) without clear pathology to explain symptoms. Given that patient does not currently have insurance, I explained that this may not be the best test at this time. Patient will consider her options and let me know if she would like the  MRI/MRA. She was not interested in another medication at this time."   Patient was seen by Dr. Heloise Lobo in [facility] neurosurgery on 09/26/22. Per that note: At this point, we do not see any reason for the patient's pulsatile tinnitus. I explained the findings of  the scans to the patient and she states understanding. At this point, we could offer a diagnostic cerebral angiogram if patient would like to further investigate causes for her pulsatile tinnitus. Risks and benefits of the procedure were discussed and she would like to think about it. She will let our office know if she would like to proceed. If she does not want to have the angiogram, she can folow up on an as needed basis. Patient should follow up with neurology for the treatment of her palatal myoclonus. Patient is in agreement with plan and all questions were answered. Should the patient have any questions, concerns or changes in neuro status, they should call our office.    Patient still has some symptoms, but since taking magnesium (started back in the middle of 09/2022), which I suggested for muscles cramps, this has almost completely resolved.   She does not have new complaints. She does mention that she may have weakness and incoordination in her right arm and leg.    Patient completed physical therapy. She did not do speech therapy due to cost (she didn't have insurance). She does have insurance, but is not interested in more therapy.   She has not been smoking since her stroke. She continues asa and statin as prescribed.  06/18/23: Patient went to ED on 02/26/23 for dizziness. She woke with right temple pain and dizziness that morning. Tylenol  helped the pain but dizziness and nausea persisted. It was worse when she moved her head. 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 There was very mild contrast enhancement on follow up MRI with contrast. A repeat MRI brain w/wo contrast was recommended in 3 months. Repeat MRI brain on 05/26/23 showed no acute process and unchanged T2 hyperintensity in left ventral medulla  with interval resolution of enhancement. MRI cervical spine on 03/09/23 showed only mild spinal stenosis.   Patient's vertigo has improved significantly. She still has weakness on her right side. This has not improved much. She still speaks slower with occasional word finding difficulties as well.   Her palatal myoclonus is still there but minimal and not bothering her currently.   She is still taking asa 81 mg and Crestor  10 mg daily.    She notes low back pain for which she is doing PT.   Of note, patient has a long history of migraines for which she takes amitriptyline  for prevention and Nurtec for rescue.  Most recent Assessment and Plan (06/18/23): This is Nancy Cochran, a 48 y.o. 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 It has improved with magnesium that was recommended for muscle cramps/spasms and only minimally symptomatic. We discussed that if symptoms return, botox by ENT may be best option (patient previously declined this treatment)  Migraines - currently about 2 per month. Well controlled with amitriptyline  and Nurtec prescribed by PCP    Plan: For secondary stroke prevention: -Continue aspirin  81 mg daily -Continue Crestor  10 mg daily -Discussed stroke warning signs   -Continue magnesium oxide 400 mg every other day   -Her migraine treatment is currently appropriate. We discussed that because of her history of stroke, she should not ever be on Triptans for migraine rescue (currently on Nurtec).  Since their last visit: There have been many updates via MyChart since last visit: -Experiencing vertigo since  06/2023, on meclizine , but didn't help much -Also having blurry vision and concerned it was related to amitriptyline . Patient wanted to stop amitriptyline  so did in 07/2023. Blurry vision cleared and vertigo improved but was still present. -I prescribed zofran  for nausea on 08/14/23 -I referred her for PT for dizziness on 08/19/23 -Patient started Topamax  for headaches (25 mg daily) on 09/29/23 -She switched back to amitriptyline  on 10/10/23 as Topamax  was not working as well -Patient messaged on 11/26/23 stating she was getting more headaches, particularly at night that would wake her.  Currently she has pain in the temple area. This was present prior to stroke, but has continued. She has jaw pain but this appears to be due to teeth clenching. She denies fevers. She denies unintentional weight loss.  She is getting 1-2 headaches per week. Nurtec works when taken at headache onset. She describes that she wakes up with a headache on the forehead. This is different from her prior migraines that were more on one side and behind the eyes. She is not currently on a preventative medication.  She endorses daytime tiredness. She has significant snoring, causing her husband to sleep in another room. She has never had a sleep study.  She still takes magnesium from the palatal myoclonus that seems to really help.  She continues to take aspirin  and Crestor  for secondary stroke prevention.  She is taking vit D and B complex.  She also mentions a lot of pain and swelling in arms and legs. She is seeing rheumatology and PCP currently for this.   MEDICATIONS:  Outpatient Encounter Medications as of 12/02/2023  Medication Sig   acetaminophen  (TYLENOL ) 325 MG tablet Take 2 tablets (650 mg total) by mouth every 6 (six) hours as needed for mild pain, fever or headache (or Fever >/= 101).   aspirin  EC 81 MG EC tablet Take 1 tablet (81 mg total) by mouth daily with breakfast. Swallow whole.   b complex vitamins  capsule Take 1 capsule by mouth daily.   Cholecalciferol (VITAMIN D3 PO) Take by mouth.   Galcanezumab -gnlm (EMGALITY ) 120 MG/ML SOAJ Inject 240 mg into the skin once for 1 dose.   Galcanezumab -gnlm (EMGALITY ) 120 MG/ML SOAJ Inject 120 mg into the skin every 28 (twenty-eight) days.   magnesium oxide (MAG-OX) 400 (240 Mg) MG tablet Take 400 mg by mouth as needed.   metoprolol  succinate (TOPROL -XL) 50 MG 24 hr tablet Take 1 tablet (50 mg total) by mouth daily. Take with or immediately following a meal.   ondansetron  (ZOFRAN -ODT) 4 MG disintegrating tablet Take 1 tablet (4 mg total) by mouth every 8 (eight) hours as needed.   Rimegepant Sulfate (NURTEC) 75 MG TBDP Take 1 tab daily as needed for migraines.   rosuvastatin  (CRESTOR ) 10 MG tablet TAKE 1 TABLET BY MOUTH EVERY DAY   triamcinolone  cream (KENALOG ) 0.1 % Apply 1 Application topically 2 (two) times daily.   zolpidem  (AMBIEN ) 10  MG tablet Take 1/2-1 tablet nightly as needed for sleep   [DISCONTINUED] amitriptyline  (ELAVIL ) 25 MG tablet Take 1 tablet (25 mg total) by mouth at bedtime. (Patient not taking: Reported on 12/02/2023)   No facility-administered encounter medications on file as of 12/02/2023.    PAST MEDICAL HISTORY: Past Medical History:  Diagnosis Date   Abnormal sensation in ear, right 07/29/2022   Cerebellar infarct (HCC) 09/03/2022   Colitis 10/18/2021   DDD (degenerative disc disease), cervical 2016   Dr. 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 10/18/2021    PAST SURGICAL HISTORY: Past Surgical History:  Procedure Laterality Date   OTHER SURGICAL  HISTORY     cyst removal shoulder    ALLERGIES: Allergies  Allergen Reactions   Gabapentin     Lyrica  [Pregabalin ]     FAMILY HISTORY: Family History  Problem Relation Age of Onset   Heart disease Father    Hypertension Maternal Grandmother    Heart attack Maternal Grandmother    Hypertension Maternal Grandfather    Heart attack Maternal Grandfather    Hypertension Paternal Grandmother    Heart attack Paternal Grandmother    Stroke Paternal Grandmother    Hypertension Paternal Grandfather    Heart attack Paternal Grandfather    Stroke Maternal Aunt    Diabetes Maternal Uncle    Breast cancer Neg Hx        Mothers family   Cancer Neg Hx        Fathers family    SOCIAL HISTORY: Social History   Tobacco Use   Smoking status: Former    Current packs/day: 0.00    Average packs/day: 1 pack/day for 28.0 years (28.0 ttl pk-yrs)    Types: Cigarettes    Quit date: 09/12/2020    Years since quitting: 3.2    Passive exposure: Never   Smokeless tobacco: Never  Vaping Use   Vaping status: Never Used  Substance Use Topics   Alcohol use: No   Drug use: No   Social History   Social History Narrative   Are you right handed or left handed? Right handed   Are you currently employed ? No       Do you live at home alone? Live with husband and Daughter      What type of home do you live in: 1 story or 2 story? One story   Caffeine 3-4 sodas a day          Objective:  Vital Signs:  BP 108/71   Pulse 84   Ht 5\' 3"  (1.6 m)   Wt 166 lb (75.3 kg)   SpO2 100%   BMI 29.41 kg/m   General: No acute distress.  Patient appears well-groomed.   Head:  Normocephalic/atraumatic Eyes:  fundi examined, disc margins clear, no obvious papilledema Neck: supple, positive for paraspinal tenderness, decreased range of motion Heart: regular rate and rhythm Lungs: Clear to auscultation bilaterally. Vascular: No carotid bruits.  Neurological Exam: Mental status: alert and oriented,  speech fluent and not dysarthric, language intact.  Cranial nerves: CN I: not tested CN II: pupils equal, round  and reactive to light, visual fields intact CN III, IV, VI:  full range of motion, end gaze nystagmus, no ptosis CN V: facial sensation intact. CN VII: upper and lower face symmetric CN VIII: hearing intact CN IX, X: uvula midline. Palatal myoclonus appreciated. CN XI: sternocleidomastoid and trapezius muscles intact CN XII: tongue midline  Bulk & Tone: normal Motor:  muscle strength 5/5 throughout Deep Tendon Reflexes:  2+ throughout  Sensation:  Light touch sensation intact. Finger to nose testing:  Without dysmetria.   Gait:  Normal station and stride.   Labs and Imaging review: New results: External labs (10/15/23): B12: 270 Vit D: 27.3 TSH wnl ESR 36 (mildly elevated) CRP wnl  HbA1c (09/26/23): 6.3  Previously reviewed results: 01/23/23: ESR 39 CRP wnl ANA positive (1:40) CK: 71 B burgdorferi ab negative HbA1c: 6.2   10/21/22: Copper  wnl Zinc  wnl   10/02/22: HbA1c: 7.2 TSH: 1.24 Lipid panel:   Component     Latest Ref Rng 10/02/2022  Cholesterol     <200 mg/dL 161   HDL Cholesterol     > OR = 50 mg/dL 39 (L)   Triglycerides     <150 mg/dL 096   LDL Cholesterol (Calc)     mg/dL (calc) 66   Total CHOL/HDL Ratio     <5.0 (calc) 3.3   Non-HDL Cholesterol (Calc)     <130 mg/dL (calc) 89        Recent Labs           Lab Results  Component Value Date    VITAMINB12 367 01/25/2022      Normal or unremarkable: antithrombin 3, anticardiolipin abs, prothombin gene mutation, factor 5 leiden, homocysteine, beta-2 glycoprotein, lupus anticoagulant, p-ANCA, c-ANCA, SSA, SSB, complement, HIV   MRI brain w/wo contrast (09/24/22): IMPRESSION: Normal MRI of the IACs.    CTA head (09/24/22): IMPRESSION: No significant venous abnormality. No evidence of venous  sinus thrombosis or stenosis.    Imaging: CT head wo contrast (10/18/21): IMPRESSION: No  acute intracranial abnormality.   MRI brain and MRI brain wo contrast (10/18/21): IMPRESSION: MRI HEAD IMPRESSION:   1. Small acute ischemic nonhemorrhagic infarct involving the superior right cerebellum. 2. Otherwise normal brain MRI for age.   MRA HEAD IMPRESSION:   1. Motion degraded exam. 2. Negative intracranial MRA. No large vessel occlusion, hemodynamically significant stenosis, or other acute vascular abnormality.   CTA neck (10/19/21): IMPRESSION: Normal CTA neck. No hemodynamically significant stenosis, occlusion, dissection, or aneurysm.   Echo (10/19/21): IMPRESSIONS:  1. Left ventricular ejection fraction, by estimation, is 55 to 60%. The  left ventricle has normal function. The left ventricle has no regional  wall motion abnormalities. Left ventricular diastolic parameters were  normal.   2. Right ventricular systolic function is normal. The right ventricular  size is normal. Tricuspid regurgitation signal is inadequate for assessing  PA pressure.   3. The mitral valve is grossly normal. Mild mitral valve regurgitation.   4. The aortic valve is tricuspid. Aortic valve regurgitation is not  visualized. No aortic stenosis is present. Aortic valve mean gradient  measures 3.0 mmHg.   5. The inferior vena cava is normal in size with greater than 50%  respiratory variability, suggesting right atrial pressure of 3 mmHg.   MRI brain w/wo contrast (05/26/23): IMPRESSION: 1. No acute intracranial abnormality or mass. 2. 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   CT  head wo contrast (02/26/23): IMPRESSION: 1. Portions of the cerebellum and occipital calvarium are excluded from the field of view inferiorly. Within this limitation, findings are as follows. 2.  No evidence of an acute intracranial abnormality. 3. Known small chronic infarct within the  medial right cerebellar hemisphere. 4. Minor paranasal sinus disease at the imaged levels.   MRI brain wo contrast (02/26/23): IMPRESSION: T2 hyperintensity and mild swelling at the left medullary olive. No restricted diffusion, question subacute infarct and symptomatology. 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 Suggest postcontrast assessment and surveillance to establish stability and exclude mass.   MRI brain w contrast (02/26/23): IMPRESSION: The area of T2/FLAIR hyperintense signal seen in the region of the left medullary olive demonstrates very mild contrast enhancement. Given prior posterior circulation infarcts, an additional differential consideration is a subacute infarct in this region. Recommend follow-up brain MRI with and without contrast in 3 months to assess for resolution.   MRI cervical spine wo contrast (03/09/23): IMPRESSION: 1. Central disc protrusions at C4-5 and C5-6 with resultant mild spinal stenosis, mildly progressed as compared to most recent MRI from 01/08/2020. 2. Additional small central disc protrusions at C3-4 and C6-7 without significant stenosis or impingement.    Assessment/Plan:  This is Nancy Cochran, a 48 y.o. 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 It has improved with magnesium that was recommended for muscle cramps/spasms and only minimally symptomatic. We discussed that if symptoms return, botox by ENT may be best option (patient previously declined this treatment)  Migraines -  currently having 5-10 headaches per month. She has a history of migraines that was previously controlled with amitriptyline , but she began having side effects (caused dizziness and blurry vision), so she was switched to topamax , which was ineffective. She is on  metoprolol  for cardiac reasons, but this does not help with headaches. She is currently not on a preventative medication. For rescue, triptans would be contraindicated given history of stroke. She is doing well on Nurtec as needed. She may also have headache contributions from undiagnosed and untreated OSA (given morning headaches) and cervicalgia.   Plan: -Blood work: ESR, CRP -Continue Vit D and B12 supplements -Sleep medicine referral for possible OSA -For secondary stroke prevention:  -Continue aspirin  81 mg daily  -Continue Crestor  10 mg daily -For migraines: Migraine prevention:  Start Emgality  (240 mg loading dose followed by 120 mg every 28 days) Migraine rescue:  Continue Nurtec 75 mg as needed at headache onset, Zofran   Limit use of pain relievers to no more than 2 days out of week to prevent risk of rebound or medication-overuse headache. Keep headache diary Could consider PT for cervicalgia in future as well.  Return to clinic in 3 months  Total time spent reviewing records, interview, history/exam, documentation, and coordination of care on day of encounter:  45 min  Rommie Coats, MD

## 2023-11-29 ENCOUNTER — Other Ambulatory Visit: Payer: Self-pay | Admitting: Family Medicine

## 2023-12-01 NOTE — Progress Notes (Signed)
 HPI: Follow-up palpitations and SVT. Patient has had previous cerebellar infarct. CTA of the head and neck March 2023 normal. Echocardiogram March 2023 showed normal LV function, mild mitral regurgitation. Monitor April 2023 showed sinus rhythm with short episodes of SVT longest being 6 beats. There was note of PACs and rare PVCs. Repeat monitor April 2024 normal by report. Note she did record rhythm strips with her Kardia mobile device associated with palpitations previously and I reviewed and these showed sinus rhythm. Since last seen patient continues to have palpitations.  She did record rhythm strips associated with these and I reviewed today and it shows sinus rhythm.  She has dyspnea on exertion unchanged.  She denies chest pain or syncope.  Current Outpatient Medications  Medication Sig Dispense Refill   acetaminophen  (TYLENOL ) 325 MG tablet Take 2 tablets (650 mg total) by mouth every 6 (six) hours as needed for mild pain, fever or headache (or Fever >/= 101). 12 tablet 0   aspirin  EC 81 MG EC tablet Take 1 tablet (81 mg total) by mouth daily with breakfast. Swallow whole. 30 tablet 11   b complex vitamins capsule Take 1 capsule by mouth daily.     Cholecalciferol (VITAMIN D3 PO) Take by mouth.     cyclobenzaprine  (FLEXERIL ) 10 MG tablet One half to one tab PO qHS, then increase gradually to one tab TID. 30 tablet 0   magnesium oxide (MAG-OX) 400 (240 Mg) MG tablet Take 400 mg by mouth as needed.     metoprolol  succinate (TOPROL -XL) 50 MG 24 hr tablet Take 1 tablet (50 mg total) by mouth daily. Take with or immediately following a meal. 90 tablet 3   ondansetron  (ZOFRAN -ODT) 4 MG disintegrating tablet Take 1 tablet (4 mg total) by mouth every 8 (eight) hours as needed. 20 tablet 3   Rimegepant Sulfate (NURTEC) 75 MG TBDP Take 1 tab daily as needed for migraines. 16 tablet 3   rosuvastatin  (CRESTOR ) 10 MG tablet Take 1 tablet (10 mg total) by mouth daily. 90 tablet 1   triamcinolone   cream (KENALOG ) 0.1 % Apply 1 Application topically 2 (two) times daily. 80 g 3   zolpidem  (AMBIEN ) 10 MG tablet Take 1/2-1 tablet nightly as needed for sleep 30 tablet 4   Galcanezumab -gnlm (EMGALITY ) 120 MG/ML SOAJ Inject 120 mg into the skin every 28 (twenty-eight) days. (Patient not taking: Reported on 12/15/2023) 1.12 mL 5   medroxyPROGESTERone (DEPO-PROVERA) 150 MG/ML injection Inject 150 mg into the muscle every 3 (three) months.     No current facility-administered medications for this visit.     Past Medical History:  Diagnosis Date   Abnormal sensation in ear, right 07/29/2022   Cerebellar infarct (HCC) 09/03/2022   Colitis 10/18/2021   DDD (degenerative disc disease), cervical 2016   Dr. 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 10/18/2021    Past Surgical History:  Procedure Laterality Date   OTHER SURGICAL HISTORY     cyst removal shoulder    Social History   Socioeconomic History   Marital status: Married    Spouse name: Not on  file   Number of children: 2   Years of education: Not on file   Highest education level: Not on file  Occupational History   Occupation: Substitute Teacher  Tobacco Use   Smoking status: Former    Current packs/day: 0.00    Average packs/day: 1 pack/day for 28.0 years (28.0 ttl pk-yrs)    Types: Cigarettes    Quit date: 09/12/2020    Years since quitting: 3.2    Passive exposure: Never   Smokeless tobacco: Never  Vaping Use   Vaping status: Never Used  Substance and Sexual Activity   Alcohol use: No   Drug use: No   Sexual activity: Yes    Partners: Male    Birth  control/protection: Injection  Other Topics Concern   Not on file  Social History Narrative   Are you right handed or left handed? Right handed   Are you currently employed ? No       Do you live at home alone? Live with husband and Daughter      What type of home do you live in: 1 story or 2 story? One story   Caffeine 3-4 sodas a day       Social Drivers of Corporate investment banker Strain: Not on file  Food Insecurity: Not on file  Transportation Needs: Not on file  Physical Activity: Not on file  Stress: Not on file  Social Connections: Unknown (10/15/2022)   Received from [facility], [facility]   Social Network    Social Network: Not on file  Intimate Partner Violence: Unknown (10/15/2022)   Received from [employer], [facility]   HITS    Physically Hurt: Not on file    Insult or Talk Down To: Not on file    Threaten Physical Harm: Not on file    Scream or Curse: Not on file    Family History  Problem Relation Age of Onset   Heart disease Father    Hypertension Maternal Grandmother    Heart attack Maternal Grandmother    Hypertension Maternal Grandfather    Heart attack Maternal Grandfather    Hypertension Paternal Grandmother    Heart attack Paternal Grandmother    Stroke Paternal Grandmother    Hypertension Paternal Grandfather    Heart attack Paternal Grandfather    Stroke Maternal Aunt    Diabetes Maternal Uncle    Breast cancer Neg Hx        Mothers family   Cancer Neg Hx        Fathers family    ROS: no fevers or chills, productive cough, hemoptysis, dysphasia, odynophagia, melena, hematochezia, dysuria, hematuria, rash, seizure activity, orthopnea, PND, pedal edema, claudication. Remaining systems are negative.  Physical Exam: Well-developed well-nourished in no acute distress.  Skin is warm and dry.  HEENT is normal.  Neck is supple.  Chest is clear to auscultation with normal expansion.  Cardiovascular exam is regular rate and  rhythm.  Abdominal exam nontender or distended. No masses palpated. Extremities show no edema. neuro grossly intact  EKG Interpretation Date/Time:  Monday Dec 15 2023 10:18:14 EDT Ventricular Rate:  83 PR Interval:  154 QRS Duration:  124 QT Interval:  380 QTC Calculation: 446 R Axis:   -45  Text Interpretation: Normal sinus rhythm Left axis deviation Right bundle branch block Possible Lateral infarct , age undetermined Confirmed by Alexandria Angel ([id number]) on 12/15/2023 10:20:38 AM    A/P  1 palpitations-previous monitor showed short run  of SVT but otherwise unrevealing.  She has recorded strips with her Kardia mobile device associated with her palpitations which showed sinus rhythm or sinus tachycardia.  She will continue to record any strips associated with palpitations.  Continue Toprol .     2 hyperlipidemia-continue statin.   3 history of cerebellar CVA-continue aspirin  and statin.  Follow-up monitor did not show atrial fibrillation.    Alexandria Angel, MD

## 2023-12-02 ENCOUNTER — Encounter: Payer: Self-pay | Admitting: Neurology

## 2023-12-02 ENCOUNTER — Telehealth: Payer: Self-pay | Admitting: Pharmacy Technician

## 2023-12-02 ENCOUNTER — Other Ambulatory Visit (HOSPITAL_COMMUNITY): Payer: Self-pay

## 2023-12-02 ENCOUNTER — Ambulatory Visit (INDEPENDENT_AMBULATORY_CARE_PROVIDER_SITE_OTHER): Admitting: Neurology

## 2023-12-02 ENCOUNTER — Other Ambulatory Visit

## 2023-12-02 ENCOUNTER — Telehealth: Payer: Self-pay

## 2023-12-02 VITALS — BP 108/71 | HR 84 | Ht 63.0 in | Wt 166.0 lb

## 2023-12-02 DIAGNOSIS — R112 Nausea with vomiting, unspecified: Secondary | ICD-10-CM | POA: Diagnosis not present

## 2023-12-02 DIAGNOSIS — M542 Cervicalgia: Secondary | ICD-10-CM

## 2023-12-02 DIAGNOSIS — I6789 Other cerebrovascular disease: Secondary | ICD-10-CM

## 2023-12-02 DIAGNOSIS — G253 Myoclonus: Secondary | ICD-10-CM

## 2023-12-02 DIAGNOSIS — R0683 Snoring: Secondary | ICD-10-CM

## 2023-12-02 DIAGNOSIS — G43009 Migraine without aura, not intractable, without status migrainosus: Secondary | ICD-10-CM | POA: Diagnosis not present

## 2023-12-02 DIAGNOSIS — Z8673 Personal history of transient ischemic attack (TIA), and cerebral infarction without residual deficits: Secondary | ICD-10-CM

## 2023-12-02 DIAGNOSIS — E785 Hyperlipidemia, unspecified: Secondary | ICD-10-CM | POA: Diagnosis not present

## 2023-12-02 DIAGNOSIS — R400 Somnolence: Secondary | ICD-10-CM

## 2023-12-02 DIAGNOSIS — R42 Dizziness and giddiness: Secondary | ICD-10-CM

## 2023-12-02 MED ORDER — EMGALITY 120 MG/ML ~~LOC~~ SOAJ: 120.0000 mg | SUBCUTANEOUS | 5 refills | Status: DC

## 2023-12-02 MED ORDER — EMGALITY 120 MG/ML ~~LOC~~ SOAJ: 240.0000 mg | Freq: Once | SUBCUTANEOUS | 0 refills | Status: AC

## 2023-12-02 NOTE — Patient Instructions (Addendum)
 I will get some blood work today.  I am concerned you have sleep apnea which can contribute to headaches and increase your risk of stroke. I am referring you to sleep medicine for this.  Your B12 was borderline low. Given your symptoms, I would recommend supplementing with B12 1000 mcg daily. This can be bought over the counter at any local drug store or online.  -Continue Vit D supplements  -For secondary stroke prevention:  -Continue aspirin  81 mg daily  -Continue Crestor  10 mg daily  -For migraines: Migraine prevention:  Start Emgality  (240 mg loading dose followed by 120 mg every 28 days) Migraine rescue:  Continue Nurtec 75 mg as needed at headache onset, Zofran   Limit use of pain relievers to no more than 2 days out of week to prevent risk of rebound or medication-overuse headache. Keep headache diary  I will see you back in clinic in 3 months. Please let me know if you have any questions or concerns in the meantime.  If you have new difficulty speaking, face droop, numbness on one side of the body, weakness on one side of the body, or dizziness/imbalance, this could be the sign of a stroke. Don't wait, please call EMS and be evaluated at the nearest emergency room.  The physicians and staff at [facility] are committed to providing excellent care. You may receive a survey requesting feedback about your experience at our office. We strive to receive "very good" responses to the survey questions. If you feel that your experience would prevent you from giving the office a "very good " response, please contact our office to try to remedy the situation. We may be reached at [phone]. Thank you for taking the time out of your busy day to complete the survey.   Rommie Coats, MD [facility]

## 2023-12-02 NOTE — Telephone Encounter (Signed)
 Pharmacy Patient Advocate Encounter   Received notification from CoverMyMeds that prior authorization for EMGALITY  120MG  is required/requested.   Insurance verification completed.   The patient is insured through [employer] .   Per test claim: PA required; PA submitted to above mentioned insurance via CoverMyMeds Key/confirmation #/EOC [id number] Status is pending

## 2023-12-02 NOTE — Telephone Encounter (Signed)
 PA has been submitted, and telephone encounter has been created. Please see telephone encounter dated 4.22.25.

## 2023-12-03 ENCOUNTER — Encounter: Payer: Self-pay | Admitting: Neurology

## 2023-12-03 ENCOUNTER — Other Ambulatory Visit (HOSPITAL_COMMUNITY): Payer: Self-pay

## 2023-12-03 ENCOUNTER — Ambulatory Visit (INDEPENDENT_AMBULATORY_CARE_PROVIDER_SITE_OTHER): Admitting: Sports Medicine

## 2023-12-03 ENCOUNTER — Encounter (INDEPENDENT_AMBULATORY_CARE_PROVIDER_SITE_OTHER): Payer: Self-pay | Admitting: Sports Medicine

## 2023-12-03 DIAGNOSIS — M5416 Radiculopathy, lumbar region: Secondary | ICD-10-CM | POA: Diagnosis not present

## 2023-12-03 DIAGNOSIS — M5030 Other cervical disc degeneration, unspecified cervical region: Secondary | ICD-10-CM

## 2023-12-03 DIAGNOSIS — B379 Candidiasis, unspecified: Secondary | ICD-10-CM

## 2023-12-03 LAB — SEDIMENTATION RATE: Sed Rate: 38 mm/h — ABNORMAL HIGH (ref 0–20)

## 2023-12-03 LAB — C-REACTIVE PROTEIN: CRP: <3 mg/L (ref <8.0)

## 2023-12-03 NOTE — Assessment & Plan Note (Signed)
 This pleasant 48 year old female returns, chronic right-sided low back pain, radiation down the right leg to the middle 3 toes consistent with an L5 distribution radiculitis, at this point she has failed physical therapy, steroids, NSAIDs, did not tolerate neuropathic agents and amitriptyline  was not effective. MRI does show some contact of the L5 nerve root in the L4-L5 disc, as the MRI findings and her symptoms match up we will proceed with a right L5-S1 transforaminal epidural. Returns 6 weeks after the injection.

## 2023-12-03 NOTE — Progress Notes (Signed)
    Procedures performed today:    None.  Independent interpretation of notes and tests performed by another provider:   Lumbar spine MRI personally reviewed, there is a disc protrusion L4-L5 contacting the right intraspinal L5 nerve.  Brief History, Exam, Impression, and Recommendations:    Right lumbar radiculitis This pleasant 48 year old female returns, chronic right-sided low back pain, radiation down the right leg to the middle 3 toes consistent with an L5 distribution radiculitis, at this point she has failed physical therapy, steroids, NSAIDs, did not tolerate neuropathic agents and amitriptyline  was not effective. MRI does show some contact of the L5 nerve root in the L4-L5 disc, as the MRI findings and her symptoms match up we will proceed with a right L5-S1 transforaminal epidural. Returns 6 weeks after the injection.    ____________________________________________ Joselyn Nicely. Sandy Crumb, M.D., ABFM., CAQSM., AME. Primary Care and Sports Medicine [facility]  Adjunct Professor of [facility] Medicine  [employer]  Restaurant manager, fast food

## 2023-12-03 NOTE — Telephone Encounter (Signed)
 Pharmacy Patient Advocate Encounter  Received notification from [facility] that Prior Authorization for EMGALITY  120MG  has been APPROVED from 4.22.25 to 4.22.26. Ran test claim, Copay is $35. This test claim was processed through [facility]- copay amounts may vary at other pharmacies due to pharmacy/plan contracts, or as the patient moves through the different stages of their insurance plan.   PA #/Case ID/Reference #: [id number]

## 2023-12-04 MED ORDER — CYCLOBENZAPRINE HCL 10 MG PO TABS: ORAL_TABLET | ORAL | 0 refills | Status: DC

## 2023-12-04 NOTE — Telephone Encounter (Signed)

## 2023-12-08 ENCOUNTER — Other Ambulatory Visit: Payer: Self-pay | Admitting: Neurology

## 2023-12-08 DIAGNOSIS — E785 Hyperlipidemia, unspecified: Secondary | ICD-10-CM

## 2023-12-08 MED ORDER — ROSUVASTATIN CALCIUM 10 MG PO TABS: 10.0000 mg | ORAL_TABLET | Freq: Every day | ORAL | 1 refills | Status: DC

## 2023-12-12 ENCOUNTER — Ambulatory Visit: Payer: Managed Care, Other (non HMO) | Admitting: Neurology

## 2023-12-15 ENCOUNTER — Ambulatory Visit (INDEPENDENT_AMBULATORY_CARE_PROVIDER_SITE_OTHER): Payer: Managed Care, Other (non HMO) | Admitting: Cardiology

## 2023-12-15 ENCOUNTER — Encounter: Payer: Self-pay | Admitting: Cardiology

## 2023-12-15 VITALS — BP 118/74 | HR 83 | Ht 63.0 in | Wt 168.0 lb

## 2023-12-15 DIAGNOSIS — I471 Supraventricular tachycardia, unspecified: Secondary | ICD-10-CM

## 2023-12-15 DIAGNOSIS — E782 Mixed hyperlipidemia: Secondary | ICD-10-CM | POA: Diagnosis not present

## 2023-12-15 DIAGNOSIS — R002 Palpitations: Secondary | ICD-10-CM

## 2023-12-15 NOTE — Patient Instructions (Signed)

## 2023-12-19 NOTE — Telephone Encounter (Signed)
 Called and left a detailed message on voice mail. Told to call if questions or concerns

## 2023-12-29 ENCOUNTER — Encounter: Payer: Self-pay | Admitting: Cardiology

## 2023-12-29 ENCOUNTER — Encounter: Payer: Self-pay | Admitting: Neurology

## 2023-12-29 ENCOUNTER — Encounter: Payer: Self-pay | Admitting: Family Medicine

## 2023-12-31 ENCOUNTER — Other Ambulatory Visit

## 2024-01-01 ENCOUNTER — Other Ambulatory Visit

## 2024-01-02 NOTE — Telephone Encounter (Signed)
 Patient scheduled for spirometry.

## 2024-01-06 ENCOUNTER — Other Ambulatory Visit

## 2024-01-08 ENCOUNTER — Other Ambulatory Visit

## 2024-01-09 ENCOUNTER — Other Ambulatory Visit: Payer: Self-pay | Admitting: Family Medicine

## 2024-01-14 ENCOUNTER — Ambulatory Visit: Payer: Managed Care, Other (non HMO) | Admitting: Neurology

## 2024-01-15 ENCOUNTER — Ambulatory Visit (HOSPITAL_BASED_OUTPATIENT_CLINIC_OR_DEPARTMENT_OTHER): Admitting: Adult Health

## 2024-01-15 ENCOUNTER — Encounter (HOSPITAL_BASED_OUTPATIENT_CLINIC_OR_DEPARTMENT_OTHER): Payer: Self-pay | Admitting: Adult Health

## 2024-01-15 VITALS — BP 115/80 | HR 87 | Ht 63.0 in | Wt 164.0 lb

## 2024-01-15 DIAGNOSIS — Z87891 Personal history of nicotine dependence: Secondary | ICD-10-CM

## 2024-01-15 DIAGNOSIS — R0683 Snoring: Secondary | ICD-10-CM | POA: Diagnosis not present

## 2024-01-15 IMAGING — CT CT ANGIO NECK
2 of 7 series · 8 of 33 positions shown · non-contrast
Comparison: None.

CLINICAL DATA: Nausea, vomiting, dizziness, history of vertigo



[Series 6: cta neck · axial · 0.49mm/px · z∈[-57,+15]mm · 2 of 110 slices shown]
[im 37/110  soft-tissue]
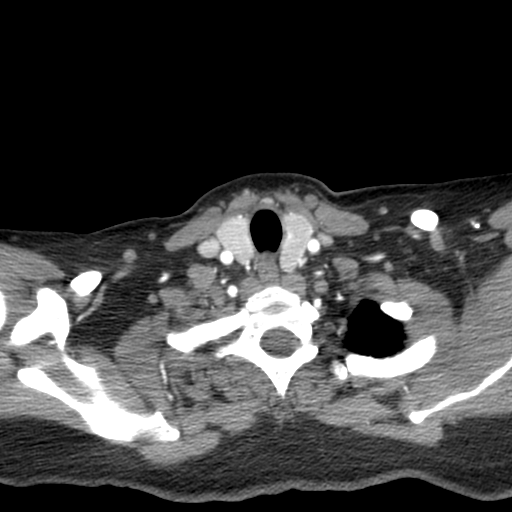
[im 73/110  soft-tissue]
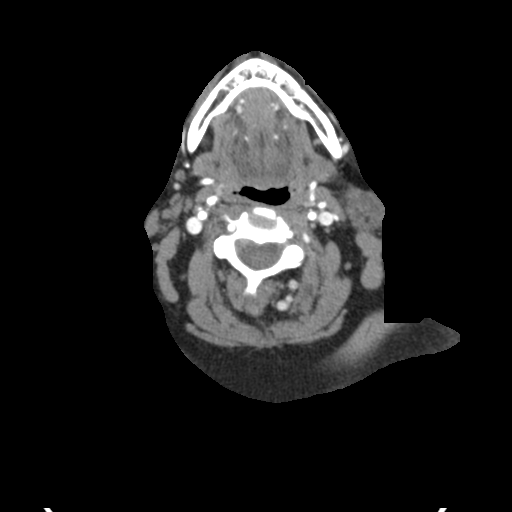

[Series 8: ax thin · axial · 0.36mm/px · z∈[-139,+21]mm · 6 of 236 slices shown]
[im 34/236  soft-tissue]
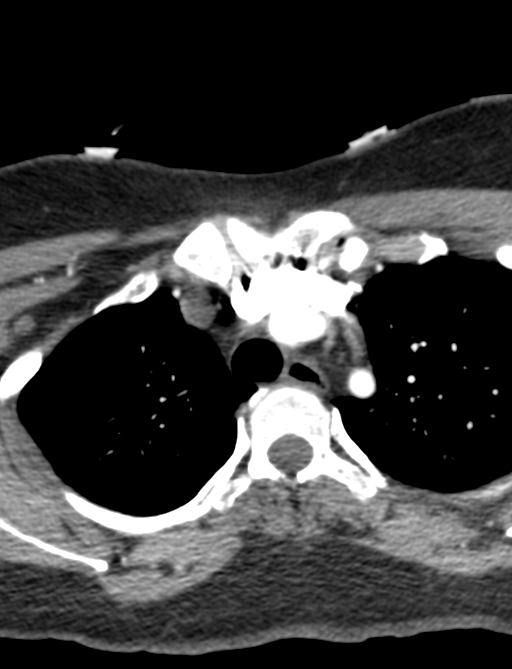
[im 68/236  bone]
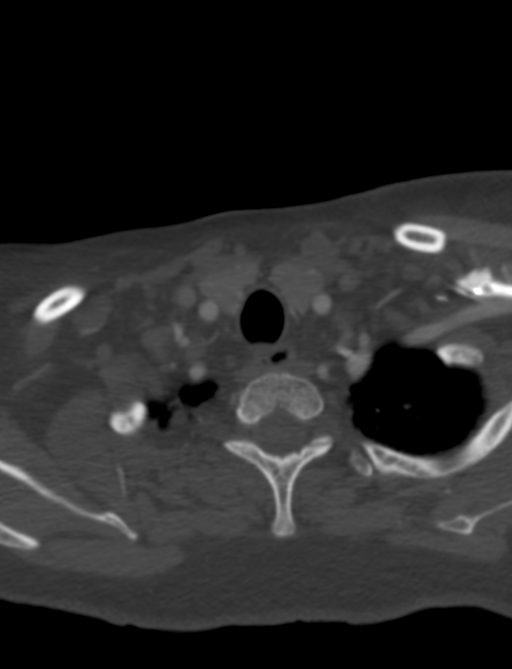
[im 101/236  soft-tissue]
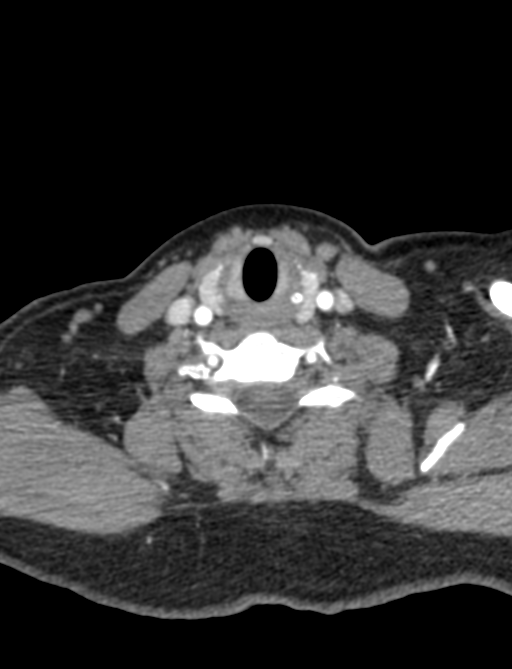
[im 135/236  bone]
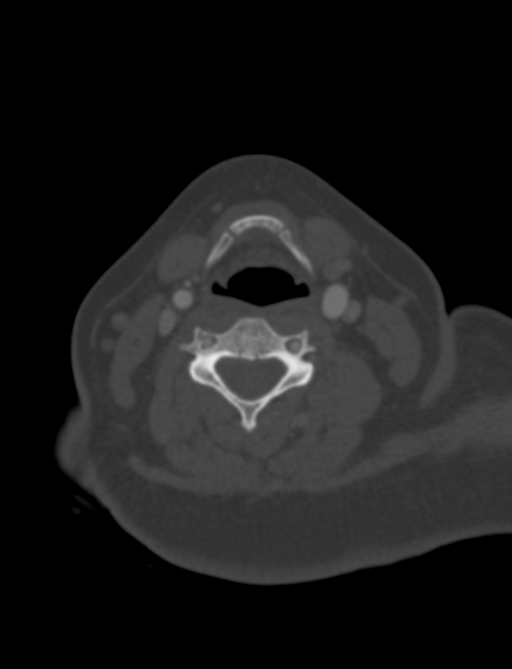
[im 168/236  soft-tissue]
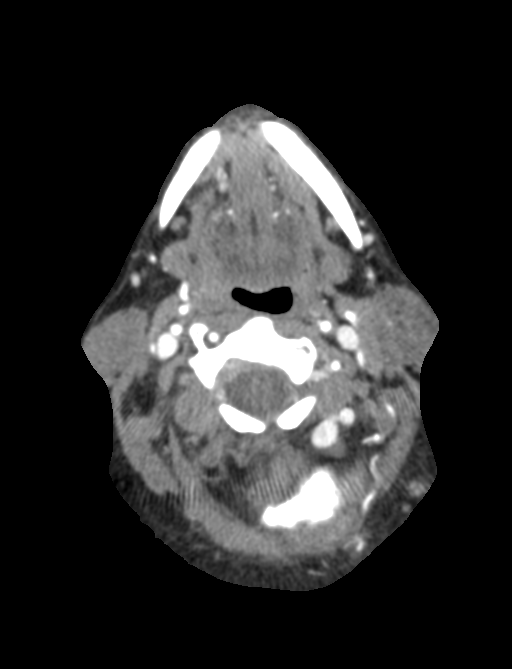
[im 202/236  bone]
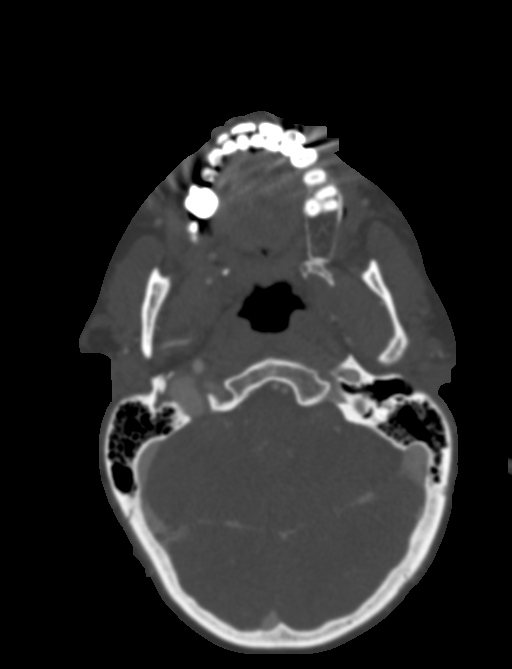

[8 of 33 positions shown; findings below may reference images not displayed]

RADIATION DOSE REDUCTION: This exam was performed according to the
departmental dose-optimization program which includes automated
exposure control, adjustment of the mA and/or kV according to
patient size and/or use of iterative reconstruction technique.

CONTRAST:  75mL OMNIPAQUE IOHEXOL 350 MG/ML SOLN
FINDINGS: Aortic arch: The aortic arch is unremarkable. There is a common
origin of the brachiocephalic and left common carotid arteries, a
normal variant. The subclavian arteries are patent to the level
imaged.

Right carotid system: Right common, internal, and external carotid
arteries are patent, without hemodynamically significant stenosis or
occlusion. There is no dissection or aneurysm.

Left carotid system: The left common, internal, and external carotid
arteries are patent, without hemodynamically significant stenosis or
occlusion. There is no dissection or aneurysm.

Vertebral arteries: The vertebral arteries are widely patent,
without hemodynamically significant stenosis or occlusion. There is
no evidence of dissection or aneurysm.

Skeleton: There is no acute osseous abnormality or aggressive
osseous lesion. There is no significant degenerative change in the
cervical spine. There is no visible canal hematoma.

Other neck: The soft tissues are unremarkable.

Upper chest: The imaged lung apices are clear.
IMPRESSION: Normal CTA neck. No hemodynamically significant stenosis, occlusion,
dissection, or aneurysm.

## 2024-01-15 NOTE — Progress Notes (Signed)
 @Patient  ID: Nancy Cochran, female    DOB: Dec 27, 1975, 48 y.o.   MRN: [id number]  Chief Complaint  Patient presents with   Establish Care    Sleep consult    Referring provider: Ellene Gustin, MD  HPI: 48 yo female seen for sleep consult 01/15/24 for snoring, restless sleep and daytime sleepiness   TEST/EVENTS :   01/15/2024 Sleep consult  Patient presents for sleep consult today.  Kindly referred by Dr. Genita Keys. Complains of loud snoring, daytime sleepiness, restless sleep.  Has frequent headaches upon awakening.  Wakes herself up frequently throughout the night.  Goes to bed about 9 PM.  Gets up several times throughout the night.  Takes up to 1-4 hours to go to sleep.  Gets up at 7:30 AM.  Weight is up 50 pounds over the last 2 years.  Current weight is at 164 pounds with a BMI of 29.  Patient does not nap.  Minimal caffeine intake.  No history of congestive heart failure.  No symptoms suspicious for cataplexy or sleep paralysis.  Epworth score is 4 out of 24. Does take Ambien  as needed for chronic insomnia. Has a history of SVT and chronic migraines.History of stroke in 2023 . -residual right sided weakness and speech changes.  Takes Ambien  on occasion for chronic insomnia.  Uses Nurtec and Flexeril  2-3 times a month.  No symptoms suspicious for cataplexy or sleep paralysis.  Social history patient is married lives at home with her son and her husband.  She works dayshift in the..  Has adult children.  She is a former smoker.  No alcohol or drug use.  Family history positive for emphysema and asthma   Past Surgical History:  Procedure Laterality Date   OTHER SURGICAL HISTORY     cyst removal shoulder      Past Medical History:  Diagnosis Date   Abnormal sensation in ear, right 07/29/2022   Cerebellar infarct (HCC) 09/03/2022   Colitis 10/18/2021   DDD (degenerative disc disease), cervical 2016   Dr. 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 12 tablet 0   aspirin  EC 81 MG EC tablet Take 1 tablet (81 mg total) by mouth daily with breakfast. Swallow whole. 30 tablet 11   b complex vitamins capsule Take 1 capsule by mouth daily.     Cholecalciferol (VITAMIN D3 PO) Take by mouth.     cyclobenzaprine  (FLEXERIL ) 10 MG tablet One half to one tab PO qHS, then increase gradually to one tab TID. 30 tablet 0   Galcanezumab -gnlm (EMGALITY ) 120 MG/ML SOAJ Inject 120 mg into the skin every 28 (twenty-eight) days. 1.12 mL 5   magnesium oxide (MAG-OX) 400 (240 Mg) MG tablet Take 400 mg by mouth as needed.     medroxyPROGESTERone (DEPO-PROVERA) 150 MG/ML injection Inject 150 mg into the  muscle every 3 (three) months.     metoprolol  succinate (TOPROL -XL) 50 MG 24 hr tablet Take 1 tablet (50 mg total) by mouth daily. Take with or immediately following a meal. 90 tablet 3   ondansetron  (ZOFRAN -ODT) 4 MG disintegrating tablet Take 1 tablet (4 mg total) by mouth every 8 (eight) hours as needed. 20 tablet 3   Rimegepant Sulfate (NURTEC) 75 MG TBDP Take 1 tab daily as needed for migraines. 16 tablet 3   rosuvastatin  (CRESTOR ) 10 MG tablet Take 1 tablet (10 mg total) by mouth daily. 90 tablet 1   triamcinolone  cream (KENALOG ) 0.1 % APPLY TO AFFECTED AREA TWICE A DAY 80 g 3   zolpidem  (AMBIEN ) 10 MG tablet Take 1/2-1 tablet nightly as needed for sleep 30 tablet  4   No current facility-administered medications on file prior to visit.      Allergies  Allergen Reactions   Gabapentin     Lyrica  [Pregabalin ]      There is no immunization history on file for this patient.  Past Medical History:  Diagnosis Date   Abnormal sensation in ear, right 07/29/2022   Cerebellar infarct (HCC) 09/03/2022   Colitis 10/18/2021   DDD (degenerative disc disease), cervical 2016   Dr. 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 12 tablet 0   aspirin  EC 81 MG EC tablet Take 1 tablet (81 mg total) by mouth daily with breakfast. Swallow whole. 30 tablet 11   b complex vitamins capsule Take 1 capsule by mouth daily.     Cholecalciferol (VITAMIN D3 PO) Take by mouth.     cyclobenzaprine  (FLEXERIL ) 10 MG tablet One half to one tab PO qHS, then increase gradually to one tab TID. 30 tablet 0   Galcanezumab -gnlm (EMGALITY ) 120 MG/ML SOAJ Inject 120 mg into the skin every 28 (twenty-eight) days. 1.12 mL 5   magnesium oxide (MAG-OX) 400 (240  Mg) MG tablet Take 400 mg by mouth as needed.     medroxyPROGESTERone (DEPO-PROVERA) 150 MG/ML injection Inject 150 mg into the muscle every 3 (three) months.     metoprolol  succinate (TOPROL -XL) 50 MG 24 hr tablet Take 1 tablet (50 mg total) by mouth daily. Take with or immediately following a meal. 90 tablet 3   ondansetron  (ZOFRAN -ODT) 4 MG disintegrating tablet Take 1 tablet (4 mg total) by mouth every 8 (eight) hours as needed. 20 tablet 3   Rimegepant Sulfate (NURTEC) 75 MG TBDP Take 1 tab daily as needed for migraines. 16 tablet  3   rosuvastatin  (CRESTOR ) 10 MG tablet Take 1 tablet (10 mg total) by mouth daily. 90 tablet 1   triamcinolone  cream (KENALOG ) 0.1 % APPLY TO AFFECTED AREA TWICE A DAY 80 g 3   zolpidem  (AMBIEN ) 10 MG tablet Take 1/2-1 tablet nightly as needed for sleep 30 tablet 4   No facility-administered medications prior to visit.     Review of Systems:   Constitutional:   No  weight loss, night sweats,  Fevers, chills,+ fatigue, or  lassitude.  HEENT:   No  Difficulty swallowing,  Tooth/dental problems, or  Sore throat,                No sneezing, itching, ear ache, nasal congestion, post nasal drip,   CV:  No chest pain,  Orthopnea, PND, swelling in lower extremities, anasarca, dizziness, palpitations, syncope.   GI  No heartburn, indigestion, abdominal pain, nausea, vomiting, diarrhea, change in bowel habits, loss of appetite, bloody stools.   Resp: No shortness of breath with exertion or at rest.  No excess mucus, no productive cough,  No non-productive cough,  No coughing up of blood.  No change in color of mucus.  No wheezing.  No chest wall deformity  Skin: no rash or lesions.  GU: no dysuria, change in color of urine, no urgency or frequency.  No flank pain, no hematuria   MS:  No joint pain or swelling.  No decreased range of motion.  No back pain.    Physical Exam  BP 115/80   Pulse 87   Ht 5\' 3"  (1.6 m)   Wt 164 lb (74.4 kg)   SpO2 100%   BMI  29.05 kg/m   GEN: A/Ox3; pleasant , NAD, well nourished    HEENT:  [place]/AT,  NOSE-clear, THROAT-clear, no lesions, no postnasal drip or exudate noted. Class 2-3 MP airway   NECK:  Supple w/ fair ROM; no JVD; normal carotid impulses w/o bruits; no thyromegaly or nodules palpated; no lymphadenopathy.    RESP  Clear  P & A; w/o, wheezes/ rales/ or rhonchi. no accessory muscle use, no dullness to percussion  CARD:  RRR, no m/r/g, no peripheral edema, pulses intact, no cyanosis or clubbing.  GI:   Soft & nt; nml bowel sounds; no organomegaly or masses detected.   Musco: Warm bil, no deformities or joint swelling noted.   Neuro: alert, no focal deficits noted.    Skin: Warm, no lesions or rashes    Lab Results:  CBC     BNP No results found for: "BNP"  ProBNP No results found for: "PROBNP"  Imaging: No results found.  Administration History     None           No data to display          No results found for: "NITRICOXIDE"      Assessment & Plan:   No problem-specific Assessment & Plan notes found for this encounter.     Roena Clark, NP 01/15/2024

## 2024-01-15 NOTE — Progress Notes (Signed)
 Epworth Sleepiness Scale  Use the following scale to choose the most appropriate number for each situation. 0 Would never nod off 1  Slight  chance of nodding off 2 Moderate chance of nodding off 3 High chance of nodding off  Sitting and reading: 1 Watching TV: 0 Sitting, inactive, in a public place (e.g., in a meeting, theater, or dinner event): 1 As a passenger in a car for an hour or more without stopping for a break: 1 Lying down to rest when circumstances permit:0 Sitting and talking to someone: 0 Sitting quietly after a meal without alcohol: 1 In a car, while stopped for a few  minutes in traffic or at a light: 0  TOTOAL: 4

## 2024-01-15 NOTE — Patient Instructions (Signed)
 Set up for home sleep study  Work on healthy weight Do not drive if sleepy  Use caution with sedating medications.  Follow up in 6 weeks and As needed

## 2024-01-15 NOTE — Assessment & Plan Note (Addendum)
 Snoring, daytime sleepiness, restless sleep, history of stroke, and chronic headache suspicious for underlying sleep apnea.  Discussed sleep apnea in detail with patient education on OSA. Recommend in lab sleep study with history of stroke. Patient declines says she can not sleep in a lab would not get any sleep . Will start with Home sleep study, may need to set up later for in lab pending results  - discussed how weight can impact sleep and risk for sleep disordered breathing - discussed options to assist with weight loss: combination of diet modification, cardiovascular and strength training exercises   - had an extensive discussion regarding the adverse health consequences related to untreated sleep disordered breathing - specifically discussed the risks for hypertension, coronary artery disease, cardiac dysrhythmias, cerebrovascular disease, and diabetes - lifestyle modification discussed   - discussed how sleep disruption can increase risk of accidents, particularly when driving - safe driving practices were discussed   Plan  Patient Instructions  Set up for home sleep study  Work on healthy weight  Do not drive if sleepy  Use caution with sedating medications  Follow up in 6 weeks and As needed

## 2024-01-29 ENCOUNTER — Encounter

## 2024-01-29 DIAGNOSIS — R0683 Snoring: Secondary | ICD-10-CM

## 2024-02-13 ENCOUNTER — Telehealth: Payer: Self-pay | Admitting: Pulmonary Disease

## 2024-02-13 ENCOUNTER — Encounter: Payer: Self-pay | Admitting: Neurology

## 2024-02-13 DIAGNOSIS — G4733 Obstructive sleep apnea (adult) (pediatric): Secondary | ICD-10-CM | POA: Diagnosis not present

## 2024-02-13 NOTE — Telephone Encounter (Signed)
 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  CPAP therapy if there is significant daytime sleepiness or other comorbidities including history of CVA or cardiac disease -If CPAP is chosen as an option of treatment auto titrating CPAP with a pressure setting of 5-15 will be appropriate  2.  Watchful waiting with emphasis on weight loss measures, sleep position modification to optimize lateral sleep, elevating the head of the bed by about 30 degrees may also help.  3.  An oral device may be fashioned for the treatment of mild sleep disordered breathing, will involve referral to dentist.   Follow-up as previously scheduled

## 2024-02-16 ENCOUNTER — Other Ambulatory Visit: Payer: Self-pay | Admitting: Neurology

## 2024-02-16 DIAGNOSIS — I6789 Other cerebrovascular disease: Secondary | ICD-10-CM

## 2024-02-16 MED ORDER — SCOPOLAMINE 1 MG/3DAYS TD PT72: 1.0000 | MEDICATED_PATCH | TRANSDERMAL | 0 refills | Status: DC

## 2024-02-23 ENCOUNTER — Other Ambulatory Visit: Payer: Self-pay | Admitting: Family Medicine

## 2024-02-27 ENCOUNTER — Ambulatory Visit

## 2024-02-29 ENCOUNTER — Encounter (HOSPITAL_BASED_OUTPATIENT_CLINIC_OR_DEPARTMENT_OTHER): Payer: Self-pay

## 2024-03-01 NOTE — Telephone Encounter (Signed)
**Note De-identified  [place] Obfuscation** Please advise 

## 2024-03-08 ENCOUNTER — Ambulatory Visit (HOSPITAL_BASED_OUTPATIENT_CLINIC_OR_DEPARTMENT_OTHER): Admitting: Adult Health

## 2024-03-08 ENCOUNTER — Encounter (HOSPITAL_BASED_OUTPATIENT_CLINIC_OR_DEPARTMENT_OTHER): Payer: Self-pay | Admitting: Adult Health

## 2024-03-08 VITALS — BP 120/80 | HR 85 | Ht 63.0 in | Wt 162.0 lb

## 2024-03-08 DIAGNOSIS — Z6828 Body mass index (BMI) 28.0-28.9, adult: Secondary | ICD-10-CM

## 2024-03-08 DIAGNOSIS — G4733 Obstructive sleep apnea (adult) (pediatric): Secondary | ICD-10-CM | POA: Diagnosis not present

## 2024-03-08 NOTE — Patient Instructions (Signed)
 Work on healthy weight loss  Keep head of bed elevated at 30 degrees  Do not drive if sleepy  Avoid sleeping on your back.  Call back if you change your mind on CPAP therapy .  Follow up As needed

## 2024-03-08 NOTE — Progress Notes (Signed)
 @Patient  ID: Nancy Cochran, female    DOB: 10/14/1975, 48 y.o.   MRN: [id number]  Chief Complaint  Patient presents with   Follow-up    Referring provider: Alvia Bring, DO  HPI: 48 yo female seen for sleep consult 01/15/24 for snoring and daytime sleepiness Medical history significant for SVT, Migraines. 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   TEST/EVENTS :  MRI brain 10/2021-Small acute ischemic nonhemorrhagic infarct involving the superior right cerebellum.  03/08/2024 Follow up : OSA Discussed the use of AI scribe software for clinical note transcription with the patient, who gave verbal consent to proceed.  History of Present Illness Nancy Cochran is a 48 year old female with mild sleep apnea who presents with symptoms of snoring, restless sleep, and daytime sleepiness.  She experiences snoring, restless sleep, and daytime sleepiness. A in lab sleep study was ordered last visit but patient declined. Agreed to home sleep study completed on 01/29/24 showed mild OSA w/ AHI 5.8 /hr , SpO2 low at  81 %. We discussed her results in detail. Went over treatment options with weight loss, positional sleep with a head of bed elevated at 30 degrees and avoidance of supine sleeping.  We went over CPAP therapy.  She is not a candidate for inspire device.  She has a history of stroke and chronic migraines.   She has a recessed jaw, noted by orthodontists as contributing to her snoring and potentially her sleep apnea. She previously sought orthodontic treatment but was unable to proceed due to her husband's cancer diagnosis and her own stroke. 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 This condition was diagnosed by an ENT.      Allergies  Allergen Reactions   Gabapentin     Lyrica  [Pregabalin ]      There is no immunization history on file for this  patient.  Past Medical History:  Diagnosis Date   Abnormal sensation in ear, right 07/29/2022   Cerebellar infarct (HCC) 09/03/2022   Colitis 10/18/2021   DDD (degenerative disc disease), cervical 2016   Dr. 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 10/18/2021    Tobacco History: Social History   Tobacco Use  Smoking Status Former   Current packs/day: 0.00   Average packs/day: 1 pack/day for 28.0 years (28.0 ttl pk-yrs)   Types: Cigarettes   Quit date: 10/10/2021   Years since quitting: 2.4   Passive exposure: Never  Smokeless Tobacco Never   Counseling given: Not Answered   Outpatient Medications Prior to Visit  Medication Sig Dispense Refill   acetaminophen  (TYLENOL ) 325 MG tablet Take 2 tablets (650 mg total) by mouth every 6 (six) hours as needed for mild pain, fever or headache (or Fever >/= 101). 12 tablet 0   aspirin  EC 81 MG EC tablet Take 1 tablet (81 mg total) by mouth daily with breakfast. Swallow whole. 30 tablet 11   b complex vitamins capsule Take 1 capsule by mouth daily.  Cholecalciferol (VITAMIN D3 PO) Take by mouth.     cyclobenzaprine  (FLEXERIL ) 10 MG tablet One half to one tab PO qHS, then increase gradually to one tab TID. 30 tablet 0   Galcanezumab -gnlm (EMGALITY ) 120 MG/ML SOAJ Inject 120 mg into the skin every 28 (twenty-eight) days. 1.12 mL 5   magnesium oxide (MAG-OX) 400 (240 Mg) MG tablet Take 400 mg by mouth as needed.     medroxyPROGESTERone (DEPO-PROVERA) 150 MG/ML injection Inject 150 mg into the muscle every 3 (three) months.     metoprolol  succinate (TOPROL -XL) 50 MG 24 hr  tablet TAKE 1 TABLET BY MOUTH DAILY. TAKE WITH OR IMMEDIATELY FOLLOWING A MEAL. 30 tablet 11   ondansetron  (ZOFRAN -ODT) 4 MG disintegrating tablet Take 1 tablet (4 mg total) by mouth every 8 (eight) hours as needed. 20 tablet 3   Rimegepant Sulfate (NURTEC) 75 MG TBDP Take 1 tab daily as needed for migraines. 16 tablet 3   rosuvastatin  (CRESTOR ) 10 MG tablet Take 1 tablet (10 mg total) by mouth daily. 90 tablet 1   scopolamine  (TRANSDERM-SCOP) 1 MG/3DAYS Place 1 patch (1.5 mg total) onto the skin every 3 (three) days. 10 patch 0   triamcinolone  cream (KENALOG ) 0.1 % APPLY TO AFFECTED AREA TWICE A DAY 80 g 3   zolpidem  (AMBIEN ) 10 MG tablet Take 1/2-1 tablet nightly as needed for sleep 30 tablet 4   No facility-administered medications prior to visit.     Review of Systems:   Constitutional:   No  weight loss, night sweats,  Fevers, chills, +fatigue, or  lassitude.  HEENT:   No headaches,  Difficulty swallowing,  Tooth/dental problems, or  Sore throat,                No sneezing, itching, ear ache, nasal congestion, post nasal drip,   CV:  No chest pain,  Orthopnea, PND, swelling in lower extremities, anasarca, dizziness, palpitations, syncope.   GI  No heartburn, indigestion, abdominal pain, nausea, vomiting, diarrhea, change in bowel habits, loss of appetite, bloody stools.   Resp: No shortness of breath with exertion or at rest.  No excess mucus, no productive cough,  No non-productive cough,  No coughing up of blood.  No change in color of mucus.  No wheezing.  No chest wall deformity  Skin: no rash or lesions.  GU: no dysuria, change in color of urine, no urgency or frequency.  No flank pain, no hematuria   MS:  No joint pain or swelling.  No decreased range of motion.  No back pain.    Physical Exam  BP 120/80 (BP Location: Left Arm, Patient Position: Sitting)   Pulse 85   Ht 5' 3 (1.6 m)   Wt 162 lb (73.5 kg)   SpO2 98%   BMI 28.70 kg/m   GEN: A/Ox3; pleasant , NAD,  well nourished    HEENT:  [place]/AT,  EACs-clear, TMs-wnl, NOSE-clear, THROAT-clear, no lesions, no postnasal drip or exudate noted.   NECK:  Supple w/ fair ROM; no JVD; normal carotid impulses w/o bruits; no thyromegaly or nodules palpated; no lymphadenopathy.    RESP  Clear  P & A; w/o, wheezes/ rales/ or rhonchi. no accessory muscle use, no dullness to percussion  CARD:  RRR, no m/r/g, no peripheral edema, pulses intact, no cyanosis or clubbing.  GI:   Soft & nt; nml bowel sounds; no organomegaly or masses detected.   Musco: Warm bil, no deformities or joint swelling noted.   Neuro: alert,  no focal deficits noted.    Skin: Warm, no lesions or rashes    Lab Results:  CBC   BMET   BNP No results found for: BNP  ProBNP No results found for: PROBNP  Imaging: No results found.  Administration History     None           No data to display          No results found for: NITRICOXIDE      Assessment & Plan:   No problem-specific Assessment & Plan notes found for this encounter.  Assessment and Plan Assessment & Plan Obstructive Sleep Apnea, Mild   Mild obstructive sleep apnea is confirmed by a sleep study,. Symptoms include snoring, restless sleep, daytime sleepiness, and headaches. Potential long-term complications such as hypertension, heart failure, arrhythmias, and stroke were discussed. She is reluctant to use CPAP due to discomfort with the mask. Recommend CPAP therapy to improve symptoms and prevent complications. Educate on CPAP therapy and its benefits. Discuss weight loss and positional therapy as alternative management strategies. Offer to reassess for CPAP therapy if she changes her mind. Not a candidate for Inspire or oral appliance. Use caution with sedating medications  - discussed how weight can impact sleep and risk for sleep disordered breathing - discussed options to assist with weight loss: combination of diet modification,  cardiovascular and strength training exercises   - had an extensive discussion regarding the adverse health consequences related to untreated sleep disordered breathing - specifically discussed the risks for hypertension, coronary artery disease, cardiac dysrhythmias, cerebrovascular disease, and diabetes - lifestyle modification discussed   - discussed how sleep disruption can increase risk of accidents, particularly when driving - safe driving practices were discussed     Stroke  Hx of Stroke.  discussed the potential link between sleep apnea and stroke due to associated arrhythmias like atrial fibrillation, which can lead to thromboembolic events.  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 She is reluctant to try Botox treatment due to concerns about adverse outcomes reported by others. Continue to follow with ENT  BMI at 28 . -healthy weight loss discussed .      Madelin Stank, NP 03/08/2024

## 2024-03-09 ENCOUNTER — Ambulatory Visit (INDEPENDENT_AMBULATORY_CARE_PROVIDER_SITE_OTHER): Admitting: Family Medicine

## 2024-03-09 VITALS — BP 122/73 | HR 80 | Ht 63.5 in | Wt 164.2 lb

## 2024-03-09 DIAGNOSIS — R0602 Shortness of breath: Secondary | ICD-10-CM

## 2024-03-09 MED ORDER — ALBUTEROL SULFATE (2.5 MG/3ML) 0.083% IN NEBU
2.5000 mg | INHALATION_SOLUTION | Freq: Once | RESPIRATORY_TRACT | Status: AC
  Administered 2024-03-09: 2.5 mg via RESPIRATORY_TRACT

## 2024-03-09 NOTE — Patient Instructions (Signed)
 Your spirometry test does not show signs of COPD or asthma.

## 2024-03-09 NOTE — Progress Notes (Signed)
 Patient here today for spirometry.  She has had continued dyspnea with exertion.  Spirometry reviewed with her and appears to be normal.  No signs of obstructive or restrictive pattern.

## 2024-03-12 ENCOUNTER — Encounter: Payer: Self-pay | Admitting: Family Medicine

## 2024-03-14 ENCOUNTER — Other Ambulatory Visit: Payer: Self-pay | Admitting: Neurology

## 2024-03-14 DIAGNOSIS — I6789 Other cerebrovascular disease: Secondary | ICD-10-CM

## 2024-03-18 NOTE — Progress Notes (Signed)
 NEUROLOGY FOLLOW UP OFFICE NOTE  Nancy Cochran [id number]  Subjective:  Nancy Cochran is a 48 y.o. 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  To briefly review: Initial consultation (06/12/22): Patient presented to the ED on 10/18/21 for vertigo, nausea, and vomiting. Patient was found to have colitis by CT abdomen and pelvis, but work up for stroke was also started. 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 She had dysarthria and right sided weakness (arm and leg) as well. Patient is not sure if this was present initially, but it was definitely noted when she returned home.    She had hypercoagulable work-up including factor V Leiden, prothrombin gene mutation, cardiolipin antibodies, beta-2 glycoprotein antibodies, lupus anticoagulant panel which were all unremarkable.  SPEP unremarkable.  No evidence of elevated ANCA titers or complement levels.  Protein S total, activity, protein C total, activity is also unremarkable.  No evidence of hypothyroidism. Cholesterol was elevated, so statin was started. She was also started on asa 81 mg. HbA1c was 5.2, TSH 0.845. Telemetry showed no arrhythmias. Echo showed no significant abnormalities. Zio patch was ordered for outpatient. Patient improved and was discharged with outpatient PT. She has not done speech therapy (was referred but could not find someone that would take her).   Patient was initially seen at [facility] in 12/2021 for post stroke care. Per their documentation, patient had aphasia and right sided weakness. Their documentation implicates depo birth control shot and smoking as likely causes of her stroke. Per patient, [name] told her to take 2 asa 81mg  daily, so she has been. She also stopped the Crestor  10 mg due to price increase  (patient does not have insurance). Patient is still taking the depo birth control shot. She quit smoking after her stroke.   Current symptoms: -Has mild dysarthria -Thumping in right ear, which she has seen ENT and PCP without cause. It has improved but still there. -Right sided weakness, improving but still weak -Occasional dizziness, especially with quick movements -Muscle spasms on right face, arm, and stomach -Feels like her heart races more often -Takes amitriptyline  for migraines (having 4-5 per month); takes tylenol  for abortive but does not help much   12/11/21: Patient messaged on 06/15/22 about thumping in ear worsening, wondering if Crestor  may have contributed. 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 While cerebellar lesions can cause this, her symptoms predate her stroke, with thumping starting in 07/2021 and stroke in 10/2021. She was given clonazepam by ENT. She tried this for a week, but it did not help. She was recommended botox by ENT, but did not want to pursue this. I also recommended sensory tricks (touching the roof of her mouthing, opening mouth wide, or valsava) to see if this helped.   Per my telephone note on 06/24/22: She also mentioned a friend with ear thumping and a facial nerve compression. I mentioned that I did not see evidence of a facial nerve problem on exam, but that another MRI/MRA could evaluate. Patient had these since symptom onset though (at time of stroke) without clear pathology to explain symptoms. Given that patient does not currently have insurance, I explained that this may not be the best test at this time. Patient will consider her options and let me know if she would like  the MRI/MRA. She was not interested in another medication at this time.   Patient was seen by Dr. Hubert in [facility] neurosurgery on 09/26/22. Per that note: At this point, we do not see any reason for the patient's pulsatile tinnitus. I explained the findings of  the scans to the patient and she states understanding. At this point, we could offer a diagnostic cerebral angiogram if patient would like to further investigate causes for her pulsatile tinnitus. Risks and benefits of the procedure were discussed and she would like to think about it. She will let our office know if she would like to proceed. If she does not want to have the angiogram, she can folow up on an as needed basis. Patient should follow up with neurology for the treatment of her palatal myoclonus. Patient is in agreement with plan and all questions were answered. Should the patient have any questions, concerns or changes in neuro status, they should call our office.    Patient still has some symptoms, but since taking magnesium (started back in the middle of 09/2022), which I suggested for muscles cramps, this has almost completely resolved.   She does not have new complaints. She does mention that she may have weakness and incoordination in her right arm and leg.    Patient completed physical therapy. She did not do speech therapy due to cost (she didn't have insurance). She does have insurance, but is not interested in more therapy.   She has not been smoking since her stroke. She continues asa and statin as prescribed.   06/18/23: Patient went to ED on 02/26/23 for dizziness. She woke with right temple pain and dizziness that morning. Tylenol  helped the pain but dizziness and nausea persisted. It was worse when she moved her head. 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 There was very mild contrast enhancement on follow up MRI with contrast. A repeat MRI brain w/wo contrast was recommended in 3 months. Repeat MRI brain on 05/26/23 showed no acute process and unchanged T2 hyperintensity in left ventral  medulla with interval resolution of enhancement. MRI cervical spine on 03/09/23 showed only mild spinal stenosis.   Patient's vertigo has improved significantly. She still has weakness on her right side. This has not improved much. She still speaks slower with occasional word finding difficulties as well.   Her palatal myoclonus is still there but minimal and not bothering her currently.   She is still taking asa 81 mg and Crestor  10 mg daily.    She notes low back pain for which she is doing PT.   Of note, patient has a long history of migraines for which she takes amitriptyline  for prevention and Nurtec for rescue.  12/02/23: There have been many updates via MyChart since last visit: -Experiencing vertigo since 06/2023, on meclizine , but didn't help much -Also having blurry vision and concerned it was related to amitriptyline . Patient wanted to stop amitriptyline  so did in 07/2023. Blurry vision cleared and vertigo improved but was still present. -I prescribed zofran  for nausea on 08/14/23 -I referred her for PT for dizziness on 08/19/23 -Patient started Topamax  for headaches (25 mg daily) on 09/29/23 -She switched back to amitriptyline  on 10/10/23 as Topamax  was not working as well -Patient messaged on 11/26/23 stating she was getting more headaches, particularly at night that would wake her.  Currently she has pain in the temple area. This was present prior to stroke, but has continued. She has jaw pain but this appears to be due to teeth clenching. She denies fevers. She denies unintentional weight loss.   She is getting 1-2 headaches per week. Nurtec works when taken at headache onset. She describes that she wakes up with a headache on the forehead. This is different from her prior migraines that were more on one side and behind the eyes. She is not currently on a preventative medication.   She endorses daytime tiredness. She has significant snoring, causing her husband to sleep in another  room. She has never had a sleep study.   She still takes magnesium from the palatal myoclonus that seems to really help.   She continues to take aspirin  and Crestor  for secondary stroke prevention.   She is taking vit D and B complex.   She also mentions a lot of pain and swelling in arms and legs. She is seeing rheumatology and PCP currently for this.  Most recent Assessment and Plan (12/02/23): This is LOURINE ALBERICO, a 48 y.o. female with: Right cerebellar stroke (10/2021) - likely secondary to small vessel disease (HTN, HLD, DM, and was smoking at time of stroke). 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 It has improved with magnesium that was recommended for muscle cramps/spasms and only minimally symptomatic. We discussed that if symptoms return, botox by ENT may be best option (patient previously declined this treatment)  Migraines - currently having 5-10 headaches per month. She has a history of migraines that was previously controlled with amitriptyline , but she began having side effects (caused dizziness and blurry vision), so she was switched to topamax , which was ineffective. She is on  metoprolol  for cardiac reasons, but this does not help with headaches. She is currently not on a preventative medication. For rescue, triptans would be contraindicated given history of stroke. She is doing well on Nurtec as needed. She may also have headache contributions from undiagnosed and untreated OSA (given morning headaches) and cervicalgia.    Plan: -Blood work: ESR, CRP -Continue Vit D and B12 supplements -Sleep medicine referral for possible OSA -For secondary stroke prevention:             -Continue aspirin  81 mg daily             -Continue Crestor  10 mg  daily -For migraines: Migraine prevention:  Start Emgality  (240 mg loading dose followed by 120 mg every 28 days) Migraine rescue:  Continue Nurtec 75 mg as needed at headache onset, Zofran   Limit use of pain relievers to no more than 2 days out of week to prevent risk of rebound or medication-overuse headache. Keep headache diary Could consider PT for cervicalgia in future as well.  Since their last visit: PA for Emagality was approved from 12/02/23 to 12/05/24. Patient has had 5 headaches since 02/08/24. They respond well to Nurtec.  Patient requested scopolamine  patches for vertigo which I sent on 02/16/24. She wonders if it is motions sickness because it occurs mostly when she is in the car, particularly when someone else is driving.   She saw sleep medicine and was diagnosed with mild sleep apnea. CPAP was recommended. She is not using this. She is going to  see ENT and hopes she can do something other than the mask.  MEDICATIONS:  Outpatient Encounter Medications as of 03/26/2024  Medication Sig   acetaminophen  (TYLENOL ) 325 MG tablet Take 2 tablets (650 mg total) by mouth every 6 (six) hours as needed for mild pain, fever or headache (or Fever >/= 101).   aspirin  EC 81 MG EC tablet Take 1 tablet (81 mg total) by mouth daily with breakfast. Swallow whole.   b complex vitamins capsule Take 1 capsule by mouth daily.   Cholecalciferol (VITAMIN D3 PO) Take by mouth.   cyclobenzaprine  (FLEXERIL ) 10 MG tablet One half to one tab PO qHS, then increase gradually to one tab TID.   Galcanezumab -gnlm (EMGALITY ) 120 MG/ML SOAJ Inject 120 mg into the skin every 28 (twenty-eight) days.   magnesium oxide (MAG-OX) 400 (240 Mg) MG tablet Take 400 mg by mouth as needed.   medroxyPROGESTERone (DEPO-PROVERA) 150 MG/ML injection Inject 150 mg into the muscle every 3 (three) months.   metoprolol  succinate (TOPROL -XL) 50 MG 24 hr tablet TAKE 1 TABLET BY MOUTH DAILY. TAKE WITH OR IMMEDIATELY FOLLOWING A MEAL.    ondansetron  (ZOFRAN -ODT) 4 MG disintegrating tablet Take 1 tablet (4 mg total) by mouth every 8 (eight) hours as needed.   Rimegepant Sulfate (NURTEC) 75 MG TBDP Take 1 tab daily as needed for migraines.   rosuvastatin  (CRESTOR ) 10 MG tablet Take 1 tablet (10 mg total) by mouth daily.   triamcinolone  cream (KENALOG ) 0.1 % APPLY TO AFFECTED AREA TWICE A DAY   zolpidem  (AMBIEN ) 10 MG tablet Take 1/2-1 tablet nightly as needed for sleep   scopolamine  (TRANSDERM-SCOP) 1 MG/3DAYS APPLY 1 PATCH TO DRY, CLEAN SKIN OF HAIRLESS AREA BEHIND EAR EVERY 3 DAYS (Patient not taking: Reported on 03/26/2024)   No facility-administered encounter medications on file as of 03/26/2024.    PAST MEDICAL HISTORY: Past Medical History:  Diagnosis Date   Abnormal sensation in ear, right 07/29/2022   Cerebellar infarct (HCC) 09/03/2022   Colitis 10/18/2021   DDD (degenerative disc disease), cervical 2016   Dr. 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 10/18/2021    PAST SURGICAL HISTORY: Past Surgical History:  Procedure Laterality Date   OTHER SURGICAL HISTORY     cyst removal shoulder    ALLERGIES: Allergies  Allergen Reactions   Gabapentin     Lyrica  [Pregabalin ]     FAMILY HISTORY: Family History  Problem Relation Age of Onset   Heart disease Father    Hypertension Maternal Grandmother    Heart attack Maternal Grandmother    Hypertension Maternal Grandfather    Heart attack Maternal Grandfather    Hypertension Paternal Grandmother    Heart attack Paternal Grandmother    Stroke Paternal Grandmother    Hypertension Paternal  Grandfather    Heart attack Paternal Grandfather    Stroke Maternal Aunt    Diabetes Maternal Uncle    Breast cancer Neg Hx        Mothers family   Cancer Neg Hx        Fathers family    SOCIAL HISTORY: Social History   Tobacco  Use   Smoking status: Former    Current packs/day: 0.00    Average packs/day: 1 pack/day for 28.0 years (28.0 ttl pk-yrs)    Types: Cigarettes    Quit date: 10/10/2021    Years since quitting: 2.4    Passive exposure: Never   Smokeless tobacco: Never  Vaping Use   Vaping status: Never Used  Substance Use Topics   Alcohol use: No   Drug use: No   Social History   Social History Narrative   Are you right handed or left handed? Right handed   Are you currently employed ? No       Do you live at home alone? Live with husband and Daughter      What type of home do you live in: 1 story or 2 story? One story   Caffeine 3-4 sodas a day      RIGHT HAND             Objective:  Vital Signs:  BP 118/80 (BP Location: Left Arm, Patient Position: Sitting, Cuff Size: Normal)   Pulse 88   Ht 5' 3 (1.6 m)   Wt 164 lb (74.4 kg)   SpO2 99%   BMI 29.05 kg/m   General: No acute distress.  Patient appears well-groomed.   Head:  Normocephalic/atraumatic Eyes:  fundi examined, disc margins clear, no obvious papilledema Neck: supple Heart: regular rate and rhythm Lungs: Clear to auscultation bilaterally. Vascular: No carotid bruits.  Neurological Exam: Mental status: alert and oriented, speech fluent and not dysarthric, language intact.  Cranial nerves: CN I: not tested CN II: pupils equal, round and reactive to light, visual fields intact CN III, IV, VI:  full range of motion, no nystagmus, no ptosis CN V: facial sensation intact. CN VII: upper and lower face symmetric CN VIII: hearing intact CN IX, X: uvula midline CN XI: sternocleidomastoid and trapezius muscles intact CN XII: tongue midline  Bulk & Tone: normal, no fasciculations. Motor:   muscle strength 5/5 throughout Deep Tendon Reflexes:  2+ throughout.   Sensation:  Pinprick sensation intact. Finger to nose testing:  Without dysmetria.   Heel to shin:  Without dysmetria.   Gait:  Normal station and stride.    Labs and Imaging review: New results: 12/02/23: CRP wnl ESR stable at 38  Previously reviewed results: External labs (10/15/23): B12: 270 Vit D: 27.3 TSH wnl ESR 36 (mildly elevated) CRP wnl   HbA1c (09/26/23): 6.3   01/23/23: ESR 39 ANA positive (1:40) CK: 71 B burgdorferi ab negative   10/21/22: Copper  wnl Zinc  wnl   10/02/22: HbA1c: 7.2 Lipid panel:   Component     Latest Ref Rng 10/02/2022  Cholesterol     <200 mg/dL 871   HDL Cholesterol     > OR = 50 mg/dL 39 (L)   Triglycerides     <150 mg/dL 854   LDL Cholesterol (Calc)     mg/dL (calc) 66   Total CHOL/HDL Ratio     <5.0 (calc) 3.3   Non-HDL Cholesterol (Calc)     <130 mg/dL (calc) 89     Normal or unremarkable: antithrombin 3, anticardiolipin abs, prothombin gene mutation, factor 5 leiden, homocysteine, beta-2 glycoprotein, lupus anticoagulant, p-ANCA, c-ANCA, SSA, SSB, complement, HIV   MRI brain w/wo contrast (09/24/22): IMPRESSION: Normal MRI of the IACs.    CTA head (09/24/22): IMPRESSION: No significant venous abnormality. No evidence of venous  sinus thrombosis or stenosis.    Imaging: CT head  wo contrast (10/18/21): IMPRESSION: No acute intracranial abnormality.   MRI brain and MRI brain wo contrast (10/18/21): IMPRESSION: MRI HEAD IMPRESSION:   1. Small acute ischemic nonhemorrhagic infarct involving the superior right cerebellum. 2. Otherwise normal brain MRI for age.   MRA HEAD IMPRESSION:   1. Motion degraded exam. 2. Negative intracranial MRA. No large vessel occlusion, hemodynamically significant stenosis, or other acute vascular abnormality.   CTA neck (10/19/21): IMPRESSION: Normal CTA neck. No hemodynamically significant stenosis,  occlusion, dissection, or aneurysm.   Echo (10/19/21): IMPRESSIONS:  1. Left ventricular ejection fraction, by estimation, is 55 to 60%. The  left ventricle has normal function. The left ventricle has no regional  wall motion abnormalities. Left ventricular diastolic parameters were  normal.   2. Right ventricular systolic function is normal. The right ventricular  size is normal. Tricuspid regurgitation signal is inadequate for assessing  PA pressure.   3. The mitral valve is grossly normal. Mild mitral valve regurgitation.   4. The aortic valve is tricuspid. Aortic valve regurgitation is not  visualized. No aortic stenosis is present. Aortic valve mean gradient  measures 3.0 mmHg.   5. The inferior vena cava is normal in size with greater than 50%  respiratory variability, suggesting right atrial pressure of 3 mmHg.    MRI brain w/wo contrast (05/26/23): IMPRESSION: 1. No acute intracranial abnormality or mass. 2. 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   CT head wo contrast (02/26/23): IMPRESSION: 1. Portions of the cerebellum and occipital calvarium are excluded from the field of view inferiorly. Within this limitation, findings are as follows. 2.  No evidence of an acute intracranial abnormality. 3. Known small chronic infarct within the medial right cerebellar hemisphere. 4. Minor paranasal sinus disease at the imaged levels.   MRI brain wo contrast (02/26/23): IMPRESSION: T2 hyperintensity and mild swelling at the left medullary olive. No restricted diffusion, question subacute infarct and symptomatology. 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 Suggest postcontrast assessment and surveillance to establish stability and exclude mass.   MRI brain w  contrast (02/26/23): IMPRESSION: The area of T2/FLAIR hyperintense signal seen in the region of the left medullary olive demonstrates very mild contrast enhancement. Given prior posterior circulation infarcts, an additional differential consideration is a subacute infarct in this region. Recommend follow-up brain MRI with and without contrast in 3 months to assess for resolution.   MRI cervical spine wo contrast (03/09/23): IMPRESSION: 1. Central disc protrusions at C4-5 and C5-6 with resultant mild spinal stenosis, mildly progressed as compared to most recent MRI from 01/08/2020. 2. Additional small central disc protrusions at C3-4 and C6-7 without significant stenosis or impingement.  Assessment/Plan:  This is ALEKSIS JIGGETTS, a 48 y.o. 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 It has improved with magnesium that was recommended for muscle cramps/spasms and only minimally symptomatic now even off magnesium. We discussed that if symptoms return, botox by ENT may be best option (patient previously declined this treatment)  Migraines - currently having 1-2 headaches per  month. She has a history of migraines that was previously controlled with amitriptyline , but she began having side effects (caused dizziness and blurry vision), so she was switched to topamax , which was ineffective. She is on  metoprolol  for cardiac reasons, but this does not help with headaches. She was started on Emgality  in 11/2023 and had great response. For rescue, triptans would be contraindicated given history of stroke. She is doing well on Nurtec as needed. She  may also have headache contributions from untreated OSA (given morning headaches) and cervicalgia.   Sleep apnea - mild by sleep study. Sleep medicine recommended CPAP but patient reluctant to get it as she thinks she will just rip it off during the night.  Plan: -Continue Vit D and B12 supplements -Discussed the dangers of sleep apnea and rationale for recommending CPAP -For secondary stroke prevention:             -Continue aspirin  81 mg daily             -Continue Crestor  10 mg daily -For migraines: Migraine prevention:  Continue Emgality  120 mg every 28 days Migraine rescue:  Continue Nurtec 75 mg as needed at headache onset, Zofran   Limit use of pain relievers to no more than 2 days out of week to prevent risk of rebound or medication-overuse headache. Keep headache diary Could consider PT for cervicalgia in future as well.  Return to clinic in 6 months  Venetia Potters, MD

## 2024-03-25 ENCOUNTER — Other Ambulatory Visit: Payer: Self-pay | Admitting: Sports Medicine

## 2024-03-25 DIAGNOSIS — M5030 Other cervical disc degeneration, unspecified cervical region: Secondary | ICD-10-CM

## 2024-03-26 ENCOUNTER — Encounter: Payer: Self-pay | Admitting: Neurology

## 2024-03-26 ENCOUNTER — Ambulatory Visit (INDEPENDENT_AMBULATORY_CARE_PROVIDER_SITE_OTHER): Admitting: Neurology

## 2024-03-26 ENCOUNTER — Ambulatory Visit: Payer: Managed Care, Other (non HMO) | Admitting: Family Medicine

## 2024-03-26 VITALS — BP 118/80 | HR 88 | Ht 63.0 in | Wt 164.0 lb

## 2024-03-26 DIAGNOSIS — Z8673 Personal history of transient ischemic attack (TIA), and cerebral infarction without residual deficits: Secondary | ICD-10-CM

## 2024-03-26 DIAGNOSIS — G253 Myoclonus: Secondary | ICD-10-CM | POA: Diagnosis not present

## 2024-03-26 DIAGNOSIS — R42 Dizziness and giddiness: Secondary | ICD-10-CM

## 2024-03-26 DIAGNOSIS — G4730 Sleep apnea, unspecified: Secondary | ICD-10-CM

## 2024-03-26 DIAGNOSIS — I6789 Other cerebrovascular disease: Secondary | ICD-10-CM

## 2024-03-26 DIAGNOSIS — G43009 Migraine without aura, not intractable, without status migrainosus: Secondary | ICD-10-CM | POA: Diagnosis not present

## 2024-03-26 DIAGNOSIS — M542 Cervicalgia: Secondary | ICD-10-CM

## 2024-03-26 NOTE — Patient Instructions (Addendum)
-  Continue Vitamin D and B12 supplements -Discussed the dangers of sleep apnea and I do recommend treating sleep apnea including with CPAP if that is recommended by sleep medicine  -For secondary stroke prevention:             -Continue aspirin  81 mg daily             -Continue Crestor  10 mg daily  -For migraines: Migraine prevention:  Continue Emgality  120 mg every 28 days Migraine rescue:  Continue Nurtec 75 mg as needed at headache onset, Zofran   Limit use of pain relievers to no more than 2 days out of week to prevent risk of rebound or medication-overuse headache. Keep headache diary   Return to clinic in 6 months  Please let me know if you have any questions or concerns in the meantime.  The physicians and staff at [facility] are committed to providing excellent care. You may receive a survey requesting feedback about your experience at our office. We strive to receive very good responses to the survey questions. If you feel that your experience would prevent you from giving the office a very good  response, please contact our office to try to remedy the situation. We may be reached at [phone]. Thank you for taking the time out of your busy day to complete the survey.  Venetia Potters, MD [facility]

## 2024-03-30 ENCOUNTER — Ambulatory Visit (INDEPENDENT_AMBULATORY_CARE_PROVIDER_SITE_OTHER): Admitting: Family Medicine

## 2024-03-30 ENCOUNTER — Encounter: Payer: Self-pay | Admitting: Family Medicine

## 2024-03-30 VITALS — BP 120/75 | HR 79 | Ht 63.0 in | Wt 162.0 lb

## 2024-03-30 DIAGNOSIS — E1169 Type 2 diabetes mellitus with other specified complication: Secondary | ICD-10-CM

## 2024-03-30 DIAGNOSIS — E782 Mixed hyperlipidemia: Secondary | ICD-10-CM

## 2024-03-30 DIAGNOSIS — R768 Other specified abnormal immunological findings in serum: Secondary | ICD-10-CM

## 2024-03-30 LAB — POCT GLYCOSYLATED HEMOGLOBIN (HGB A1C): HbA1c, POC (controlled diabetic range): 6.5 % (ref 0.0–7.0)

## 2024-03-30 NOTE — Assessment & Plan Note (Signed)
 Diabetes is well controlled without medication at this time.  Continue with dietary changes.

## 2024-03-30 NOTE — Progress Notes (Signed)
 Nancy Cochran - 48 y.o. female MRN [id number]  Date of birth: [birth year]  Subjective Chief Complaint  Patient presents with   Diabetes   Hyperlipidemia    HPI Nancy Cochran is a 48 y.o. female here today for follow up visit.    She reports that she is doing ok.   Seen by neurology last week due to history of stroke and palatal myoclonus.  Overall this is stable.  She remains on asa and crestor  for 2/2 prevention.  Headaches are stable with emgality  and nurtec for breakthrough.  She was dx with OSA but has not started CPAP due to concerns about tolerance.  She is planning on seeing ENT to discuss alternatives.   She has been managing diabetes with dietary changes.  Her A1c today is 6.5%.  She feels that she is doing pretty well with diet and activity.   She has seen rheumatology for elevated ANA.  She would like second opinion from another rheumatologist.   ROS:  A comprehensive ROS was completed and negative except as noted per HPI    Allergies  Allergen Reactions   Gabapentin     Lyrica  Bede.Boone ]     Past Medical History:  Diagnosis Date   Abnormal sensation in ear, right 07/29/2022   Cerebellar infarct (HCC) 09/03/2022   Colitis 10/18/2021   DDD (degenerative disc disease), cervical 2016   Dr. 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 10/18/2021    Past Surgical History:  Procedure Laterality Date   OTHER SURGICAL HISTORY     cyst removal shoulder    Social History   Socioeconomic History   Marital status: Married    Spouse name: Not on file   Number of children: 2   Years of education:  Not on file   Highest education level: Not on file  Occupational History   Occupation: Lawyer  Tobacco Use   Smoking status: Former    Current packs/day: 0.00    Average packs/day: 1 pack/day for 28.0 years (28.0 ttl pk-yrs)    Types: Cigarettes    Quit date: 10/10/2021    Years since quitting: 2.4    Passive exposure: Never   Smokeless tobacco: Never  Vaping Use   Vaping status: Never Used  Substance and Sexual Activity   Alcohol use: No   Drug use: No   Sexual activity: Yes    Partners: Male    Birth control/protection: Injection  Other Topics Concern   Not on file  Social History Narrative   Are you right handed or left handed? Right handed   Are you currently employed ? No       Do you live at home alone? Live with husband and Daughter      What type of home do you live in: 1 story or 2 story? One story   Caffeine 3-4 sodas a day      RIGHT HAND          Social Drivers of Health   Financial Resource Strain: Not on file  Food Insecurity: No Food Insecurity (12/29/2023)   Received [facility]   Hunger Vital Sign    Within the past 12 months, you worried that your food would run out before you got the money to buy more.: Never true    Within the past 12 months, the food you bought just didn't last and you didn't have money to get more.: Never true  Transportation Needs: No Transportation Needs (12/29/2023)   Received from [facility] - Transportation    In the past 12 months, has lack of transportation kept you from medical appointments or from getting medications?: No    Lack of Transportation (Non-Medical): No  Physical Activity: Not on file  Stress: Not on file  Social Connections: Unknown (10/15/2022)   Received from [facility]   Social Network    Social Network: Not on file    Family History  Problem Relation Age of Onset   Heart disease Father    Hypertension Maternal Grandmother    Heart  attack Maternal Grandmother    Hypertension Maternal Grandfather    Heart attack Maternal Grandfather    Hypertension Paternal Grandmother    Heart attack Paternal Grandmother    Stroke Paternal Grandmother    Hypertension Paternal Grandfather    Heart attack Paternal Grandfather    Stroke Maternal Aunt    Diabetes Maternal Uncle    Breast cancer Neg Hx        Mothers family   Cancer Neg Hx        Fathers family    Health Maintenance  Topic Date Due   Hepatitis C Screening  Never done   DTaP/Tdap/Td (1 - Tdap) Never done   Hepatitis B Vaccines 19-59 Average Risk (1 of 3 - 19+ 3-dose series) Never done   HPV VACCINES (1 - Risk 3-dose SCDM series) Never done   Cervical Cancer Screening (HPV/Pap Cotest)  07/29/2022   OPHTHALMOLOGY EXAM  09/17/2023   Diabetic kidney evaluation - eGFR measurement  02/26/2024   INFLUENZA VACCINE  03/12/2024   Pneumococcal Vaccine (1 of 2 - PCV) 09/25/2024 (Originally 06/26/1997)   FOOT EXAM  04/17/2024   Diabetic kidney evaluation - Urine ACR  06/25/2024   HEMOGLOBIN A1C  09/30/2024   MAMMOGRAM  11/04/2025   Colonoscopy  11/02/2033   HIV Screening  Completed   Meningococcal B Vaccine  Aged Out   COVID-19 Vaccine  Discontinued     ----------------------------------------------------------------------------------------------------------------------------------------------------------------------------------------------------------------- Physical Exam BP 120/75   Pulse 79   Ht 5' 3 (1.6 m)   Wt 162 lb (73.5 kg)   SpO2 100%   BMI 28.70 kg/m   Physical Exam Constitutional:      Appearance: Normal appearance.  Cardiovascular:     Rate and Rhythm: Normal rate and regular rhythm.  Pulmonary:     Effort: Pulmonary effort is normal.     Breath sounds: Normal breath sounds.  Neurological:     General: No focal deficit present.     Mental Status: She is alert.  Psychiatric:        Mood and Affect: Mood normal.        Behavior: Behavior  normal.     ------------------------------------------------------------------------------------------------------------------------------------------------------------------------------------------------------------------- Assessment and Plan  ANA positive Referral to rheumatology for second opinion.   Type 2 diabetes mellitus with other specified complication (HCC) Diabetes is well controlled without medication at this time.  Continue with dietary changes.   Mixed dyslipidemia Continue crestor  at current strength.    No orders of the defined types were placed in  this encounter.   No follow-ups on file.

## 2024-03-30 NOTE — Assessment & Plan Note (Signed)
Referral to rheumatology for second opinion.  

## 2024-03-30 NOTE — Assessment & Plan Note (Signed)
Continue crestor at current strength  

## 2024-04-01 LAB — CMP14+EGFR
ALT: 21 IU/L (ref 0–32)
AST: 21 IU/L (ref 0–40)
Albumin: 4 g/dL (ref 3.9–4.9)
Alkaline Phosphatase: 111 IU/L (ref 44–121)
BUN/Creatinine Ratio: 11 (ref 9–23)
BUN: 9 mg/dL (ref 6–24)
Bilirubin Total: 0.4 mg/dL (ref 0.0–1.2)
CO2: 21 mmol/L (ref 20–29)
Calcium: 9.6 mg/dL (ref 8.7–10.2)
Chloride: 104 mmol/L (ref 96–106)
Creatinine, Ser: 0.84 mg/dL (ref 0.57–1.00)
Globulin, Total: 2.7 g/dL (ref 1.5–4.5)
Glucose: 103 mg/dL — ABNORMAL HIGH (ref 70–99)
Potassium: 4.5 mmol/L (ref 3.5–5.2)
Sodium: 141 mmol/L (ref 134–144)
Total Protein: 6.7 g/dL (ref 6.0–8.5)
eGFR: 87 mL/min/1.73 (ref >59)

## 2024-04-01 LAB — CBC WITH DIFFERENTIAL/PLATELET
Basophils Absolute: 0.1 x10E3/uL (ref 0.0–0.2)
Basos: 1 %
EOS (ABSOLUTE): 0.2 x10E3/uL (ref 0.0–0.4)
Eos: 2 %
Hematocrit: 45.8 % (ref 34.0–46.6)
Hemoglobin: 14.2 g/dL (ref 11.1–15.9)
Immature Grans (Abs): 0 x10E3/uL (ref 0.0–0.1)
Immature Granulocytes: 0 %
Lymphocytes Absolute: 2.2 x10E3/uL (ref 0.7–3.1)
Lymphs: 19 %
MCH: 28.6 pg (ref 26.6–33.0)
MCHC: 31 g/dL — ABNORMAL LOW (ref 31.5–35.7)
MCV: 92 fL (ref 79–97)
Monocytes Absolute: 0.6 x10E3/uL (ref 0.1–0.9)
Monocytes: 6 %
Neutrophils Absolute: 8.3 x10E3/uL — ABNORMAL HIGH (ref 1.4–7.0)
Neutrophils: 72 %
Platelets: 353 x10E3/uL (ref 150–450)
RBC: 4.96 x10E6/uL (ref 3.77–5.28)
RDW: 12.7 % (ref 11.7–15.4)
WBC: 11.4 x10E3/uL — ABNORMAL HIGH (ref 3.4–10.8)

## 2024-04-01 LAB — LIPID PANEL WITH LDL/HDL RATIO
Cholesterol, Total: 112 mg/dL (ref 100–199)
HDL: 30 mg/dL — ABNORMAL LOW (ref >39)
LDL Chol Calc (NIH): 57 mg/dL (ref 0–99)
LDL/HDL Ratio: 1.9 ratio (ref 0.0–3.2)
Triglycerides: 144 mg/dL (ref 0–149)
VLDL Cholesterol Cal: 25 mg/dL (ref 5–40)

## 2024-04-01 LAB — MICROALBUMIN / CREATININE URINE RATIO
Creatinine, Urine: 278.7 mg/dL
Microalb/Creat Ratio: 4 mg/g{creat} (ref 0–29)
Microalbumin, Urine: 10.4 ug/mL

## 2024-04-13 ENCOUNTER — Encounter: Payer: Self-pay | Admitting: Sports Medicine

## 2024-04-16 ENCOUNTER — Ambulatory Visit: Payer: Self-pay | Admitting: Family Medicine

## 2024-04-30 ENCOUNTER — Other Ambulatory Visit: Payer: Self-pay | Admitting: Family Medicine

## 2024-04-30 DIAGNOSIS — F5101 Primary insomnia: Secondary | ICD-10-CM

## 2024-04-30 NOTE — Telephone Encounter (Signed)
 ZOLPIDEM  Last OV: 03/30/24 Next OV: 09/30/24 Last RF: 09/19/23

## 2024-05-03 ENCOUNTER — Encounter: Payer: Self-pay | Admitting: Cardiology

## 2024-05-03 ENCOUNTER — Ambulatory Visit (INDEPENDENT_AMBULATORY_CARE_PROVIDER_SITE_OTHER): Admitting: Cardiology

## 2024-05-03 VITALS — BP 127/82 | HR 86 | Ht 63.0 in | Wt 161.0 lb

## 2024-05-03 DIAGNOSIS — R002 Palpitations: Secondary | ICD-10-CM

## 2024-05-03 DIAGNOSIS — E782 Mixed hyperlipidemia: Secondary | ICD-10-CM | POA: Diagnosis not present

## 2024-05-03 DIAGNOSIS — I471 Supraventricular tachycardia, unspecified: Secondary | ICD-10-CM

## 2024-05-03 DIAGNOSIS — R0609 Other forms of dyspnea: Secondary | ICD-10-CM | POA: Diagnosis not present

## 2024-05-03 NOTE — Progress Notes (Signed)
 HPI: Follow-up palpitations and SVT. Patient has had previous cerebellar infarct. CTA of the head and neck March 2023 normal. Echocardiogram March 2023 showed normal LV function, mild mitral regurgitation. Monitor April 2023 showed sinus rhythm with short episodes of SVT longest being 6 beats. There was note of PACs and rare PVCs. Repeat monitor April 2024 normal by report. Note she did record rhythm strips with her Kardia mobile device associated with palpitations previously and I reviewed and these showed sinus rhythm. Since last seen patient describes increased dyspnea on exertion.  She denies orthopnea, PND or syncope.  No chest pain.  She continues to have occasional palpitations.  Current Outpatient Medications  Medication Sig Dispense Refill   acetaminophen  (TYLENOL ) 325 MG tablet Take 2 tablets (650 mg total) by mouth every 6 (six) hours as needed for mild pain, fever or headache (or Fever >/= 101). 12 tablet 0   aspirin  EC 81 MG EC tablet Take 1 tablet (81 mg total) by mouth daily with breakfast. Swallow whole. 30 tablet 11   b complex vitamins capsule Take 1 capsule by mouth daily.     Cholecalciferol (VITAMIN D3 PO) Take by mouth.     cyclobenzaprine  (FLEXERIL ) 10 MG tablet TAKE 1/2 TO 1 TABLET BY MOUTH AT BEDTIME THEN INCREASE TO 1 TAB 3 TIMES A DAY 30 tablet 1   Galcanezumab -gnlm (EMGALITY ) 120 MG/ML SOAJ Inject 120 mg into the skin every 28 (twenty-eight) days. 1.12 mL 5   magnesium oxide (MAG-OX) 400 (240 Mg) MG tablet Take 400 mg by mouth as needed.     medroxyPROGESTERone (DEPO-PROVERA) 150 MG/ML injection Inject 150 mg into the muscle every 3 (three) months.     metoprolol  succinate (TOPROL -XL) 50 MG 24 hr tablet TAKE 1 TABLET BY MOUTH DAILY. TAKE WITH OR IMMEDIATELY FOLLOWING A MEAL. 30 tablet 11   ondansetron  (ZOFRAN -ODT) 4 MG disintegrating tablet Take 1 tablet (4 mg total) by mouth every 8 (eight) hours as needed. 20 tablet 3   Rimegepant Sulfate (NURTEC) 75 MG TBDP Take 1  tab daily as needed for migraines. 16 tablet 3   rosuvastatin  (CRESTOR ) 10 MG tablet Take 1 tablet (10 mg total) by mouth daily. 90 tablet 1   scopolamine  (TRANSDERM-SCOP) 1 MG/3DAYS APPLY 1 PATCH TO DRY, CLEAN SKIN OF HAIRLESS AREA BEHIND EAR EVERY 3 DAYS 10 patch 0   triamcinolone  cream (KENALOG ) 0.1 % APPLY TO AFFECTED AREA TWICE A DAY 80 g 3   zolpidem  (AMBIEN ) 10 MG tablet Take 1/2-1 tablet nightly as needed for sleep 30 tablet 4   No current facility-administered medications for this visit.     Past Medical History:  Diagnosis Date   Abnormal sensation in ear, right 07/29/2022   Cerebellar infarct (HCC) 09/03/2022   Colitis 10/18/2021   DDD (degenerative disc disease), cervical 2016   Dr. 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 10/18/2021    Past Surgical History:  Procedure Laterality Date   OTHER SURGICAL HISTORY     cyst removal shoulder    Social History   Socioeconomic History   Marital status: Married  Spouse name: Not on file   Number of children: 2   Years of education: Not on file   Highest education level: Not on file  Occupational History   Occupation: Substitute Teacher  Tobacco Use   Smoking status: Former    Current packs/day: 0.00    Average packs/day: 1 pack/day for 28.0 years (28.0 ttl pk-yrs)    Types: Cigarettes    Quit date: 10/10/2021    Years since quitting: 2.5    Passive exposure: Never   Smokeless tobacco: Never  Vaping Use   Vaping status: Never Used  Substance and Sexual Activity   Alcohol use: No   Drug use: No   Sexual activity: Yes    Partners: Male    Birth  control/protection: Injection  Other Topics Concern   Not on file  Social History Narrative   Are you right handed or left handed? Right handed   Are you currently employed ? No       Do you live at home alone? Live with husband and Daughter      What type of home do you live in: 1 story or 2 story? One story   Caffeine 3-4 sodas a day      RIGHT HAND          Social Drivers of Corporate investment banker Strain: Not on file  Food Insecurity: No Food Insecurity (12/29/2023)   Received from [facility]   Hunger Vital Sign    Within the past 12 months, you worried that your food would run out before you got the money to buy more.: Never true    Within the past 12 months, the food you bought just didn't last and you didn't have money to get more.: Never true  Transportation Needs: No Transportation Needs (12/29/2023)   Received from [facility] - Transportation    In the past 12 months, has lack of transportation kept you from medical appointments or from getting medications?: No    Lack of Transportation (Non-Medical): No  Physical Activity: Not on file  Stress: Not on file  Social Connections: Unknown (10/15/2022)   Received from [facility]   Social Network    Social Network: Not on file  Intimate Partner Violence: Unknown (10/15/2022)   Received from [facility]   HITS    Physically Hurt: Not on file    Insult or Talk Down To: Not on file    Threaten Physical Harm: Not on file    Scream or Curse: Not on file    Family History  Problem Relation Age of Onset   Heart disease Father    Hypertension Maternal Grandmother    Heart attack Maternal Grandmother    Hypertension Maternal Grandfather    Heart attack Maternal Grandfather    Hypertension Paternal Grandmother    Heart attack Paternal Grandmother    Stroke Paternal Grandmother    Hypertension Paternal Grandfather    Heart attack Paternal Grandfather    Stroke  Maternal Aunt    Diabetes Maternal Uncle    Breast cancer Neg Hx        Mothers family   Cancer Neg Hx        Fathers family    ROS: no fevers or chills, productive cough, hemoptysis, dysphasia, odynophagia, melena, hematochezia, dysuria, hematuria, rash, seizure activity, orthopnea, PND, pedal edema, claudication. Remaining systems are negative.  Physical Exam: Well-developed well-nourished in no acute distress.  Skin  is warm and dry.  HEENT is normal.  Neck is supple.  Chest is clear to auscultation with normal expansion.  Cardiovascular exam is regular rate and rhythm.  Abdominal exam nontender or distended. No masses palpated. Extremities show no edema. neuro grossly intact  ECG- personally reviewed  A/P  1 palpitations-symptoms are unchanged.  Continue Toprol  at present dose.  2 history of cerebellar CVA-continue aspirin  and statin.  She has no documented history of atrial fibrillation.  3 hyperlipidemia-continue statin.  4 dyspnea on exertion-etiology unclear.  Will arrange echocardiogram to reassess LV function.  Redell Shallow, MD

## 2024-05-03 NOTE — Patient Instructions (Signed)
   Testing/Procedures:  Your physician has requested that you have an echocardiogram. Echocardiography is a painless test that uses sound waves to create images of your heart. It provides your doctor with information about the size and shape of your heart and how well your heart's chambers and valves are working. This procedure takes approximately one hour. There are no restrictions for this procedure. Please do NOT wear cologne, perfume, aftershave, or lotions (deodorant is allowed). Please arrive 15 minutes prior to your appointment time.  Please note: We ask at that you not bring children with you during ultrasound (echo/ vascular) testing. Due to room size and safety concerns, children are not allowed in the ultrasound rooms during exams. Our front office staff cannot provide observation of children in our lobby area while testing is being conducted. An adult accompanying a patient to their appointment will only be allowed in the ultrasound room at the discretion of the ultrasound technician under special circumstances. We apologize for any inconvenience. [facility] 1 ST FLOOR IMAGING DEPARTMENT  Follow-Up: At [facility], you and your health needs are our priority.  As part of our continuing mission to provide you with exceptional heart care, our providers are all part of one team.  This team includes your primary Cardiologist (physician) and Advanced Practice Providers or APPs (Physician Assistants and Nurse Practitioners) who all work together to provide you with the care you need, when you need it.  Your next appointment:   12 month(s)  Provider:   Alexandria Angel, MD

## 2024-05-26 ENCOUNTER — Ambulatory Visit (HOSPITAL_BASED_OUTPATIENT_CLINIC_OR_DEPARTMENT_OTHER)
Admission: RE | Admit: 2024-05-26 | Discharge: 2024-05-26 | Disposition: A | Discharge status: Home / Self Care | Source: Ambulatory Visit | Attending: Cardiology | Admitting: Cardiology

## 2024-05-26 DIAGNOSIS — R0609 Other forms of dyspnea: Secondary | ICD-10-CM | POA: Diagnosis present

## 2024-05-27 ENCOUNTER — Encounter: Payer: Self-pay | Admitting: Family Medicine

## 2024-05-27 ENCOUNTER — Ambulatory Visit: Payer: Self-pay | Admitting: Cardiology

## 2024-05-27 DIAGNOSIS — R0609 Other forms of dyspnea: Secondary | ICD-10-CM

## 2024-05-27 LAB — ECHOCARDIOGRAM COMPLETE
AR max vel: 2.13 cm2
AV Area VTI: 2.13 cm2
AV Area mean vel: 2.28 cm2
AV Mean grad: 3 mmHg
AV Peak grad: 6 mmHg
Ao pk vel: 1.22 m/s
Area-P 1/2: 2.99 cm2
Calc EF: 55.8 %
MV M vel: 2.86 m/s
MV Peak grad: 32.7 mmHg
S' Lateral: 2.2 cm
Single Plane A2C EF: 56 %
Single Plane A4C EF: 56.7 %

## 2024-06-02 ENCOUNTER — Other Ambulatory Visit: Payer: Self-pay | Admitting: Neurology

## 2024-06-02 DIAGNOSIS — E785 Hyperlipidemia, unspecified: Secondary | ICD-10-CM

## 2024-06-04 ENCOUNTER — Telehealth: Payer: Self-pay

## 2024-06-04 NOTE — Telephone Encounter (Signed)
 Copied from CRM [id number]. Topic: Clinical - Request for Lab/Test Order >> Jun 04, 2024 11:07 AM Dedra B wrote: Reason for CRM: Pt called to follow up CT scan for chest. She wants to know where and when she's supposed to be having it done. Pls call pt.

## 2024-06-04 NOTE — Telephone Encounter (Signed)
 Called patient and gave contact information to radiology scheduling at [facility] to initiate scheduluing.

## 2024-06-10 ENCOUNTER — Telehealth: Payer: Self-pay

## 2024-06-10 NOTE — Telephone Encounter (Signed)
 Copied from CRM [id number]. Topic: General - Other >> Jun 10, 2024  3:52 PM Charlet HERO wrote: Reason for CRM: Patient is calling bc the referral was denied bc Dr Alvia states he did not do a peer to peer review but the insurance is stating that it is approved, She wants to know what is the next steps bc her appt with imaging ct has cncld her appt. Spoke to [place] please call the patient back with more information.

## 2024-06-10 NOTE — Telephone Encounter (Signed)
 Please see notes attached to CT order.  What can I do to assist with this?

## 2024-06-11 ENCOUNTER — Other Ambulatory Visit

## 2024-06-17 ENCOUNTER — Encounter: Payer: Self-pay | Admitting: Neurology

## 2024-06-17 ENCOUNTER — Ambulatory Visit: Admitting: Cardiology

## 2024-06-18 ENCOUNTER — Other Ambulatory Visit: Payer: Self-pay | Admitting: Family Medicine

## 2024-06-18 DIAGNOSIS — R0600 Dyspnea, unspecified: Secondary | ICD-10-CM

## 2024-06-18 DIAGNOSIS — R942 Abnormal results of pulmonary function studies: Secondary | ICD-10-CM

## 2024-06-18 DIAGNOSIS — R0609 Other forms of dyspnea: Secondary | ICD-10-CM

## 2024-06-21 ENCOUNTER — Encounter: Payer: Self-pay | Admitting: Family Medicine

## 2024-06-23 ENCOUNTER — Ambulatory Visit

## 2024-06-23 DIAGNOSIS — R0600 Dyspnea, unspecified: Secondary | ICD-10-CM

## 2024-06-23 NOTE — Telephone Encounter (Signed)
 Patient chart shows that patient had Cxray today but has not yet resulted.

## 2024-06-24 ENCOUNTER — Ambulatory Visit: Payer: Self-pay | Admitting: Family Medicine

## 2024-06-24 NOTE — Addendum Note (Signed)
 Addended by: ALVIA VELMA BRAVO on: 06/24/2024 10:53 PM   Modules accepted: Orders

## 2024-07-13 ENCOUNTER — Other Ambulatory Visit: Payer: Self-pay | Admitting: Neurology

## 2024-07-13 DIAGNOSIS — G43009 Migraine without aura, not intractable, without status migrainosus: Secondary | ICD-10-CM

## 2024-07-15 ENCOUNTER — Other Ambulatory Visit: Payer: Self-pay | Admitting: Orthopedic Surgery

## 2024-07-15 ENCOUNTER — Ambulatory Visit

## 2024-07-15 DIAGNOSIS — M533 Sacrococcygeal disorders, not elsewhere classified: Secondary | ICD-10-CM

## 2024-07-15 DIAGNOSIS — R0609 Other forms of dyspnea: Secondary | ICD-10-CM

## 2024-07-15 DIAGNOSIS — R942 Abnormal results of pulmonary function studies: Secondary | ICD-10-CM

## 2024-07-27 ENCOUNTER — Inpatient Hospital Stay: Admission: RE | Admit: 2024-07-27

## 2024-07-27 ENCOUNTER — Other Ambulatory Visit

## 2024-08-03 ENCOUNTER — Ambulatory Visit
Admission: RE | Admit: 2024-08-03 | Discharge: 2024-08-03 | Disposition: A | Discharge status: Home / Self Care | Source: Ambulatory Visit | Attending: Orthopedic Surgery | Admitting: Orthopedic Surgery

## 2024-08-03 DIAGNOSIS — M533 Sacrococcygeal disorders, not elsewhere classified: Secondary | ICD-10-CM

## 2024-08-29 ENCOUNTER — Other Ambulatory Visit: Payer: Self-pay | Admitting: Family Medicine

## 2024-09-07 ENCOUNTER — Other Ambulatory Visit (HOSPITAL_COMMUNITY): Payer: Self-pay

## 2024-09-13 ENCOUNTER — Telehealth: Payer: Self-pay

## 2024-09-13 ENCOUNTER — Other Ambulatory Visit: Payer: Self-pay | Admitting: Neurology

## 2024-09-13 DIAGNOSIS — G43009 Migraine without aura, not intractable, without status migrainosus: Secondary | ICD-10-CM

## 2024-09-13 NOTE — Telephone Encounter (Signed)
 Reached out to the pharmacy  who states they are going to get it ready for the patient now.

## 2024-09-16 ENCOUNTER — Telehealth: Payer: Self-pay | Admitting: Neurology

## 2024-09-16 ENCOUNTER — Telehealth: Payer: Self-pay

## 2024-09-16 NOTE — Telephone Encounter (Signed)
 Called patient and the medicine is Emgality . I sent message to PA team and pt is aware.

## 2024-09-16 NOTE — Telephone Encounter (Signed)
 [facility] Call ID: [id number]  [facility]: [phone]  Caller states her insurance need an approval for the Dr to approve. [facility] told her to call the office for a status update.

## 2024-09-17 ENCOUNTER — Other Ambulatory Visit (HOSPITAL_COMMUNITY): Payer: Self-pay

## 2024-09-30 ENCOUNTER — Ambulatory Visit: Admitting: Family Medicine

## 2024-10-01 ENCOUNTER — Ambulatory Visit: Admitting: Neurology
